# Patient Record
Sex: Male | Born: 1975
Health system: Southern US, Community
[De-identification: ages and names within clinical notes are randomized; demographics above are authoritative.]

## PROBLEM LIST (undated history)

## (undated) DIAGNOSIS — J45909 Unspecified asthma, uncomplicated: Secondary | ICD-10-CM

## (undated) DIAGNOSIS — F609 Personality disorder, unspecified: Secondary | ICD-10-CM

## (undated) DIAGNOSIS — E119 Type 2 diabetes mellitus without complications: Secondary | ICD-10-CM

## (undated) DIAGNOSIS — M7711 Lateral epicondylitis, right elbow: Secondary | ICD-10-CM

## (undated) DIAGNOSIS — I10 Essential (primary) hypertension: Secondary | ICD-10-CM

## (undated) HISTORY — DX: Unspecified asthma, uncomplicated: J45.909

## (undated) HISTORY — DX: Type 2 diabetes mellitus without complications: E11.9

## (undated) HISTORY — DX: Personality disorder, unspecified: F60.9

## (undated) HISTORY — DX: Essential (primary) hypertension: I10

---

## 1994-10-11 HISTORY — PX: MENISCUS REPAIR: SHX5179

## 2004-10-11 HISTORY — PX: ANTERIOR CRUCIATE LIGAMENT REPAIR: SHX115

## 2005-02-26 ENCOUNTER — Ambulatory Visit: Payer: Self-pay

## 2005-12-23 ENCOUNTER — Ambulatory Visit: Payer: Self-pay | Admitting: Internal Medicine

## 2006-01-12 ENCOUNTER — Ambulatory Visit: Payer: Self-pay | Admitting: Internal Medicine

## 2006-06-16 ENCOUNTER — Ambulatory Visit: Payer: Self-pay

## 2006-10-17 ENCOUNTER — Ambulatory Visit: Payer: Self-pay | Admitting: Unknown Physician Specialty

## 2008-10-11 HISTORY — PX: CERVICAL DISC SURGERY: SHX588

## 2010-07-25 ENCOUNTER — Ambulatory Visit: Payer: Self-pay | Admitting: Family Medicine

## 2010-09-25 ENCOUNTER — Ambulatory Visit: Payer: Self-pay | Admitting: Neurosurgery

## 2013-04-05 DIAGNOSIS — D229 Melanocytic nevi, unspecified: Secondary | ICD-10-CM

## 2013-04-05 HISTORY — DX: Melanocytic nevi, unspecified: D22.9

## 2013-11-26 ENCOUNTER — Ambulatory Visit: Payer: Self-pay | Admitting: General Practice

## 2014-04-23 DIAGNOSIS — D239 Other benign neoplasm of skin, unspecified: Secondary | ICD-10-CM

## 2014-04-23 HISTORY — DX: Other benign neoplasm of skin, unspecified: D23.9

## 2014-08-12 ENCOUNTER — Emergency Department: Payer: Self-pay | Admitting: Emergency Medicine

## 2015-09-15 ENCOUNTER — Encounter: Payer: BLUE CROSS/BLUE SHIELD | Attending: Physician Assistant | Admitting: *Deleted

## 2015-09-15 ENCOUNTER — Encounter: Payer: Self-pay | Admitting: *Deleted

## 2015-09-15 VITALS — BP 132/96 | Ht 71.0 in | Wt 255.9 lb

## 2015-09-15 DIAGNOSIS — E119 Type 2 diabetes mellitus without complications: Secondary | ICD-10-CM | POA: Diagnosis not present

## 2015-09-15 DIAGNOSIS — R7303 Prediabetes: Secondary | ICD-10-CM

## 2015-09-15 NOTE — Patient Instructions (Addendum)
Check blood sugars 2 x day before breakfast and 2 hrs after supper 3 x week Exercise:  Continue cardio for  30-60  minutes   4 days a week Eat 3 meals day,   1-2  snacks a day Space meals 4-6 hours apart Bring blood sugar records to the next class Call your doctor for a prescription for:  1. Meter strips (type) One Touch Ultra Blue checking  3 times per week  2. Lancets (type) One Touch Delica checking  3     times per week

## 2015-09-16 NOTE — Progress Notes (Signed)
Diabetes Self-Management Education  Visit Type: First/Initial  Appt. Start Time: 1555 Appt. End Time: [id number]  09/16/2015  Mr. Francisco Jackson, identified by name and date of birth, is a 39 y.o. male with a diagnosis of Diabetes: Type 2.   ASSESSMENT  Blood pressure 132/96, height 5\' 11"  (1.803 m), weight 255 lb 14.4 oz (116.075 kg). Body mass index is 35.71 kg/(m^2).      Diabetes Self-Management Education - 09/15/15 1732    Visit Information   Visit Type First/Initial   Initial Visit   Diabetes Type Type 2   Are you currently following a meal plan? Yes   What type of meal plan do you follow? "eating no whites"   Are you taking your medications as prescribed? Yes   Date Diagnosed 2 weeks ago   Health Coping   How would you rate your overall health? Good   Psychosocial Assessment   Patient Belief/Attitude about Diabetes Motivated to manage diabetes   Self-care barriers None   Self-management support Doctor's office;Family   Patient Concerns Nutrition/Meal planning;Monitoring;Glycemic Control;Weight Control;Healthy Lifestyle   Special Needs None   Preferred Learning Style Auditory   Learning Readiness Change in progress   How often do you need to have someone help you when you read instructions, pamphlets, or other written materials from your doctor or pharmacy? 1 - Never   What is the last grade level you completed in school? [id number]   Complications   Last HgB A1C per patient/outside source 6.4 %  08/29/15   How often do you check your blood sugar? 0 times/day (not testing)  Provided One Touch Ultra Mini meter and instructed on use. BG upon return demonstration was 94 mg/dL at 5:05 pm - 1 1/2 hrs pp.    Have you had a dilated eye exam in the past 12 months? Yes   Have you had a dental exam in the past 12 months? Yes   Are you checking your feet? No   Dietary Intake   Breakfast eggs and fruit or oatmeal with honey or egg sandwich with cheese (eats out 4 x week)   Snack  (morning) peanut butter crackers   Lunch chicken, greek salad (eats out 5 x week)   Snack (afternoon) fruit , peanut butter crackers   Dinner lean meat - deer, soup, chili beans, spaghetti, chicken with pintos, green beans (eats out 5 x week)   Beverage(s) water   Exercise   Exercise Type Moderate (swimming / aerobic walking)   How many days per week to you exercise? 4   How many minutes per day do you exercise? 45   Total minutes per week of exercise 180   Patient Education   Previous Diabetes Education No   Disease state  Definition of diabetes, type 1 and 2, and the diagnosis of diabetes   Nutrition management  Role of diet in the treatment of diabetes and the relationship between the three main macronutrients and blood glucose level   Physical activity and exercise  Role of exercise on diabetes management, blood pressure control and cardiac health.   Monitoring Taught/evaluated SMBG meter.;Purpose and frequency of SMBG.;Identified appropriate SMBG and/or A1C goals.   Chronic complications Relationship between chronic complications and blood glucose control   Individualized Goals (developed by patient)   Reducing Risk Improve blood sugars Prevent diabetes complications Lose weight Lead a healthier lifestyle   Outcomes   Expected Outcomes Demonstrated interest in learning. Expect positive outcomes      Individualized Plan  for Diabetes Self-Management Training:   Learning Objective:  Patient will have a greater understanding of diabetes self-management. Patient education plan is to attend individual and/or group sessions per assessed needs and concerns.   Plan:   Patient Instructions  Check blood sugars 2 x day before breakfast and 2 hrs after supper 3 x week Exercise:  Continue cardio for  30-60  minutes   4 days a week Eat 3 meals day,   1-2  snacks a day Space meals 4-6 hours apart Bring blood sugar records to the next class Call your doctor for a prescription for:  1.  Meter strips (type) One Touch Ultra Blue checking  3 times per week  2. Lancets (type) One Touch Delica checking  3     times per week   Expected Outcomes:  Demonstrated interest in learning. Expect positive outcomes  Education material provided:  General Meal Planning Guidelines Simple Meal Plan Meter - One Touch Ultra Mini   If problems or questions, patient to contact team via:   Francisco Jackson, [place], [place], CDE [phone]  Future DSME appointment:  October 09, 2015 for Class 1

## 2015-09-21 ENCOUNTER — Emergency Department
Admission: EM | Admit: 2015-09-21 | Discharge: 2015-09-21 | Disposition: A | Discharge status: Home / Self Care | Payer: BLUE CROSS/BLUE SHIELD | Attending: Emergency Medicine | Admitting: Emergency Medicine

## 2015-09-21 DIAGNOSIS — Y998 Other external cause status: Secondary | ICD-10-CM | POA: Insufficient documentation

## 2015-09-21 DIAGNOSIS — R197 Diarrhea, unspecified: Secondary | ICD-10-CM | POA: Diagnosis not present

## 2015-09-21 DIAGNOSIS — Y9389 Activity, other specified: Secondary | ICD-10-CM | POA: Diagnosis not present

## 2015-09-21 DIAGNOSIS — Z791 Long term (current) use of non-steroidal anti-inflammatories (NSAID): Secondary | ICD-10-CM | POA: Diagnosis not present

## 2015-09-21 DIAGNOSIS — Y9289 Other specified places as the place of occurrence of the external cause: Secondary | ICD-10-CM | POA: Insufficient documentation

## 2015-09-21 DIAGNOSIS — X58XXXA Exposure to other specified factors, initial encounter: Secondary | ICD-10-CM | POA: Insufficient documentation

## 2015-09-21 DIAGNOSIS — L500 Allergic urticaria: Secondary | ICD-10-CM | POA: Diagnosis not present

## 2015-09-21 DIAGNOSIS — M791 Myalgia: Secondary | ICD-10-CM | POA: Insufficient documentation

## 2015-09-21 DIAGNOSIS — Z87891 Personal history of nicotine dependence: Secondary | ICD-10-CM | POA: Insufficient documentation

## 2015-09-21 DIAGNOSIS — T7840XA Allergy, unspecified, initial encounter: Secondary | ICD-10-CM | POA: Insufficient documentation

## 2015-09-21 DIAGNOSIS — Z7951 Long term (current) use of inhaled steroids: Secondary | ICD-10-CM | POA: Insufficient documentation

## 2015-09-21 DIAGNOSIS — Z79899 Other long term (current) drug therapy: Secondary | ICD-10-CM | POA: Insufficient documentation

## 2015-09-21 DIAGNOSIS — R21 Rash and other nonspecific skin eruption: Secondary | ICD-10-CM | POA: Diagnosis present

## 2015-09-21 DIAGNOSIS — L509 Urticaria, unspecified: Secondary | ICD-10-CM

## 2015-09-21 LAB — CBC WITH DIFFERENTIAL/PLATELET
Basophils Absolute: 0 10*3/uL (ref 0–0.1)
Basophils Relative: 0 %
Eosinophils Absolute: 0.7 10*3/uL (ref 0–0.7)
Eosinophils Relative: 7 %
HCT: 47.6 % (ref 40.0–52.0)
Hemoglobin: 16.5 g/dL (ref 13.0–18.0)
Lymphocytes Relative: 28 %
Lymphs Abs: 2.9 10*3/uL (ref 1.0–3.6)
MCH: 29.4 pg (ref 26.0–34.0)
MCHC: 34.7 g/dL (ref 32.0–36.0)
MCV: 84.8 fL (ref 80.0–100.0)
Monocytes Absolute: 1.3 10*3/uL — ABNORMAL HIGH (ref 0.2–1.0)
Monocytes Relative: 13 %
Neutro Abs: 5.6 10*3/uL (ref 1.4–6.5)
Neutrophils Relative %: 52 %
Platelets: 291 10*3/uL (ref 150–440)
RBC: 5.62 MIL/uL (ref 4.40–5.90)
RDW: 12.4 % (ref 11.5–14.5)
WBC: 10.5 10*3/uL (ref 3.8–10.6)

## 2015-09-21 LAB — COMPREHENSIVE METABOLIC PANEL
ALT: 48 U/L (ref 17–63)
AST: 36 U/L (ref 15–41)
Albumin: 3.9 g/dL (ref 3.5–5.0)
Alkaline Phosphatase: 68 U/L (ref 38–126)
Anion gap: 8 (ref 5–15)
BUN: 31 mg/dL — ABNORMAL HIGH (ref 6–20)
CO2: 23 mmol/L (ref 22–32)
Calcium: 9.3 mg/dL (ref 8.9–10.3)
Chloride: 108 mmol/L (ref 101–111)
Creatinine, Ser: 1.18 mg/dL (ref 0.61–1.24)
GFR calc Af Amer: >60 mL/min (ref >60)
GFR calc non Af Amer: >60 mL/min (ref >60)
Glucose, Bld: 103 mg/dL — ABNORMAL HIGH (ref 65–99)
Potassium: 3.9 mmol/L (ref 3.5–5.1)
Sodium: 139 mmol/L (ref 135–145)
Total Bilirubin: 0.6 mg/dL (ref 0.3–1.2)
Total Protein: 7.8 g/dL (ref 6.5–8.1)

## 2015-09-21 MED ORDER — FAMOTIDINE IN NACL 20-0.9 MG/50ML-% IV SOLN
20.0000 mg | Freq: Once | INTRAVENOUS | Status: AC
  Administered 2015-09-21: 20 mg via INTRAVENOUS
  Filled 2015-09-21: qty 50

## 2015-09-21 MED ORDER — PREDNISONE 20 MG PO TABS: 60.0000 mg | ORAL_TABLET | Freq: Every day | ORAL | Status: AC

## 2015-09-21 MED ORDER — PREDNISONE 20 MG PO TABS
60.0000 mg | ORAL_TABLET | ORAL | Status: AC
  Administered 2015-09-21: 60 mg via ORAL
  Filled 2015-09-21: qty 3

## 2015-09-21 MED ORDER — DIPHENHYDRAMINE HCL 50 MG/ML IJ SOLN
25.0000 mg | Freq: Once | INTRAMUSCULAR | Status: AC
  Administered 2015-09-21: 25 mg via INTRAVENOUS
  Filled 2015-09-21: qty 1

## 2015-09-21 NOTE — ED Provider Notes (Signed)
[facility] Emergency Department Provider Note  ____________________________________________  Time seen: Approximately 5:37 AM  I have reviewed the triage vital signs and the nursing notes.   HISTORY  Chief Complaint Rash    HPI Francisco Jackson is a 39 y.o. male with no significant PMH other than prediabetes who presents with acute onset of generalized pruritic rash.  He states that he has had some viral symptoms for the last couple of weeks.  He recently got back from a camping trip the last about 2 days and when he returned he had a febrile illness for about 4 days with generalized body aches and myalgias and diarrhea.  That has resolved and during the day yesterday he felt well, was out and about with his wife, but he started developing a red itching raised rash first on his right wrist and then it moved to his abdomen.  He took some Benadryl and he got some relief, but then he awoke about 2 hours ago and the rash has generalized over his entire body except for his face and the palms of his hands and soles of his feet.  It is severely itchy and "driving me crazy".  It is not painful.  He denies fever/chills, chest pain, shortness of breath, abdominal pain, nausea, vomiting, diarrhea for the last 2 days.  He has not recently started any new medications.  He has no allergies of which she is aware.  He has not had hives in the past.  He is unaware of any tick bites.  He does not have any oral lesions, painful or otherwise.  He has no headache.  Past Medical History  Diagnosis Date  . Asthma     There are no active problems to display for this patient.   Past Surgical History  Procedure Laterality Date  . Cervical disc surgery  2010  . Anterior cruciate ligament repair Right 2006  . Meniscus repair Left 1996    Current Outpatient Rx  Name  Route  Sig  Dispense  Refill  . ALBUTEROL SULFATE HFA IN   Inhalation   Inhale 2 puffs into the lungs 4 (four) times  daily as needed.         . budesonide-formoterol (SYMBICORT) 160-4.5 MCG/ACT inhaler   Inhalation   Inhale 2 puffs into the lungs 2 (two) times daily.         . clonazePAM (KLONOPIN) 0.5 MG tablet   Oral   Take 0.5 mg by mouth 2 (two) times daily as needed for anxiety.         . Meloxicam (VIVLODEX) 10 MG CAPS   Oral   Take 10 mg by mouth daily.         . montelukast (SINGULAIR) 10 MG tablet   Oral   Take 10 mg by mouth at bedtime.         . predniSONE (DELTASONE) 20 MG tablet   Oral   Take 3 tablets (60 mg total) by mouth daily.   15 tablet   0     Allergies Review of patient's allergies indicates no known allergies.  Family History  Problem Relation Age of Onset  . Diabetes Mother   . Diabetes Paternal Aunt   . Diabetes Paternal Uncle   . Diabetes Maternal Grandmother   . Diabetes Maternal Grandfather     Social History Social History  Substance Use Topics  . Smoking status: Former Smoker -- 0.50 packs/day for 10 years    Types:  Cigarettes    Quit date: 10/11/2002  . Smokeless tobacco: Former Systems developer    Types: Snuff    Quit date: 10/11/2009  . Alcohol Use: 1.2 oz/week    2 Glasses of wine per week    Review of Systems Constitutional: No fever/chills the last 2 days.  For the 4 prior days he did have intermittent myalgias, general malaise, and subjective fevers. Eyes: No visual changes. ENT: No sore throat. Cardiovascular: Denies chest pain. Respiratory: Denies shortness of breath. Gastrointestinal: For the last 2 days he has not had any abdominal pain, nausea, vomiting, or diarrhea.  He did have diarrhea for the 4 days before that. Genitourinary: Negative for dysuria. Musculoskeletal: Negative for back pain. Skin: Generalized pruritic red rash Neurological: Negative for headaches, focal weakness or numbness.  10-point ROS otherwise negative.  ____________________________________________   PHYSICAL EXAM:  VITAL SIGNS: ED Triage Vitals   Enc Vitals Group     BP 09/21/15 0518 138/93 mmHg     Pulse Rate 09/21/15 0518 87     Resp 09/21/15 0518 18     Temp 09/21/15 0518 98 F (36.7 C)     Temp Source 09/21/15 0518 Oral     SpO2 09/21/15 0518 96 %     Weight 09/21/15 0518 248 lb (112.492 kg)     Height 09/21/15 0518 5\' 11"  (1.803 m)     Head Cir --      Peak Flow --      Pain Score --      Pain Loc --      Pain Edu? --      Excl. in [place]? --     Constitutional: Alert and oriented. Well appearing and in no acute distress. Eyes: Conjunctivae are normal. PERRL. EOMI. Head: Atraumatic. Nose: No congestion/rhinnorhea. Mouth/Throat: Mucous membranes are moist.  Oropharynx non-erythematous.  There are no oral lesions; no mucosal involvement. Neck: No stridor.   Cardiovascular: Normal rate, regular rhythm. Grossly normal heart sounds.  Good peripheral circulation. Respiratory: Normal respiratory effort.  No retractions. Lungs CTAB. Gastrointestinal: Soft and nontender. No distention. No abdominal bruits. No CVA tenderness. Musculoskeletal: No lower extremity tenderness nor edema.  No joint effusions. Neurologic:  Normal speech and language. No gross focal neurologic deficits are appreciated.  Skin:  Skin is warm, dry and intact.  Patient has urticaria all over his anterior and posterior torso although it is most notable on the anterior side as well as on all 4 of his extremities.  The palms and soles are spared as is his face and oral mucosa. Psychiatric: Mood and affect are normal. Speech and behavior are normal.  ____________________________________________   LABS (all labs ordered are listed, but only abnormal results are displayed)  Labs Reviewed  COMPREHENSIVE METABOLIC PANEL - Abnormal; Notable for the following:    Glucose, Bld 103 (*)    BUN 31 (*)    All other components within normal limits  CBC WITH DIFFERENTIAL/PLATELET - Abnormal; Notable for the following:    Monocytes Absolute 1.3 (*)    All other  components within normal limits   ____________________________________________  EKG  Not indicated ____________________________________________  RADIOLOGY   No results found.  ____________________________________________   PROCEDURES  Procedure(s) performed: None  Critical Care performed: No ____________________________________________   INITIAL IMPRESSION / ASSESSMENT AND PLAN / ED COURSE  Pertinent labs & imaging results that were available during my care of the patient were reviewed by me and considered in my medical decision making (see chart for  details).  The rash appears consistent with an allergic reaction to an unknown allergen.  However the history is complicated by his recent camping trip and febrile/diarrheal illness.  A tick borne illness would be very unexpected in early December but must be considered.  He has no other obvious sources of allergic reaction.  I will treat him with Benadryl, famotidine, and prednisone and will also check some basic labs looking for LFT elevation and thrombocytopenia.  He is normal vital signs and is afebrile tonight.  I anticipate treatment for unknown allergic reaction and outpatient follow-up.  ----------------------------------------- 6:43 AM on 09/21/2015 -----------------------------------------  Labs unremarkable with no evidence of LFT elevation or thrombocytopenia.  The patient's hives have nearly completely resolved and he feels much better.  He was resting and sleeping comfortably when I checked on him.  He is ready to go home and I think this is appropriate.    I gave my usual and customary return precautions.  I told him to take over-the-counter Benadryl in addition to the prescribed prednisone burst.  ____________________________________________  FINAL CLINICAL IMPRESSION(S) / ED DIAGNOSES  Final diagnoses:  Urticaria of entire body  Allergic reaction, initial encounter      NEW MEDICATIONS STARTED DURING THIS  VISIT:  New Prescriptions   PREDNISONE (DELTASONE) 20 MG TABLET    Take 3 tablets (60 mg total) by mouth daily.     Hinda Kehr, MD 09/21/15 [phone]

## 2015-09-21 NOTE — ED Notes (Signed)
Pt reports cold symptoms x 2 weeks. c/o fever and diarrhea from Tuesday to Friday of last week. c/o rash that started yesterday morning and has continued to increase.

## 2015-09-21 NOTE — Discharge Instructions (Signed)
You have been seen in the Emergency Department (ED) today for an allergic reaction.  You have been stable throughout your stay in the Emergency Department.  Please take your medications as prescribed and follow up with your doctor as indicated.  You should also take over-the-counter Benadryl around the clock for the next three days according to the dosing instructions on the package, or at the least take it as soon as you see your hives returning.  Return to the Emergency Department (ED) if you experience any worsening or new symptoms that concern you.   Hives Hives are itchy, red, swollen areas of the skin. They can vary in size and location on your body. Hives can come and go for hours or several days (acute hives) or for several weeks (chronic hives). Hives do not spread from person to person (noncontagious). They may get worse with scratching, exercise, and emotional stress. CAUSES   Allergic reaction to food, additives, or drugs.  Infections, including the common cold.  Illness, such as vasculitis, lupus, or thyroid disease.  Exposure to sunlight, heat, or cold.  Exercise.  Stress.  Contact with chemicals. SYMPTOMS   Red or white swollen patches on the skin. The patches may change size, shape, and location quickly and repeatedly.  Itching.  Swelling of the hands, feet, and face. This may occur if hives develop deeper in the skin. DIAGNOSIS  Your caregiver can usually tell what is wrong by performing a physical exam. Skin or blood tests may also be done to determine the cause of your hives. In some cases, the cause cannot be determined. TREATMENT  Mild cases usually get better with medicines such as antihistamines. Severe cases may require an emergency epinephrine injection. If the cause of your hives is known, treatment includes avoiding that trigger.  HOME CARE INSTRUCTIONS   Avoid causes that trigger your hives.  Take antihistamines as directed by your caregiver to reduce  the severity of your hives. Non-sedating or low-sedating antihistamines are usually recommended. Do not drive while taking an antihistamine.  Take any other medicines prescribed for itching as directed by your caregiver.  Wear loose-fitting clothing.  Keep all follow-up appointments as directed by your caregiver. SEEK MEDICAL CARE IF:   You have persistent or severe itching that is not relieved with medicine.  You have painful or swollen joints. SEEK IMMEDIATE MEDICAL CARE IF:   You have a fever.  Your tongue or lips are swollen.  You have trouble breathing or swallowing.  You feel tightness in the throat or chest.  You have abdominal pain. These problems may be the first sign of a life-threatening allergic reaction. Call your local emergency services (911 in [place] MAKE SURE YOU:   Understand these instructions.  Will watch your condition.  Will get help right away if you are not doing well or get worse.   This information is not intended to replace advice given to you by your health care provider. Make sure you discuss any questions you have with your health care provider.   Document Released: 09/27/2005 Document Revised: 10/02/2013 Document Reviewed: 12/21/2011 Elsevier Interactive Patient Education 2016 [place] An allergy is an abnormal reaction to a substance by the body's defense system (immune system). Allergies can develop at any age. WHAT CAUSES ALLERGIES? An allergic reaction happens when the immune system mistakenly reacts to a normally harmless substance, called an allergen, as if it were harmful. The immune system releases antibodies to fight the substance. Antibodies eventually release  a chemical called histamine into the bloodstream. The release of histamine is meant to protect the body from infection, but it also causes discomfort. An allergic reaction can be triggered by:  Eating an allergen.  Inhaling an allergen.  Touching an  allergen. WHAT TYPES OF ALLERGIES ARE THERE? There are many types of allergies. Common types include:  Seasonal allergies. People with this type of allergy are usually allergic to substances that are only present during certain seasons, such as molds and pollens.  Food allergies.  Drug allergies.  Insect allergies.  Animal dander allergies. WHAT ARE SYMPTOMS OF ALLERGIES? Possible allergy symptoms include:  Swelling of the lips, face, tongue, mouth, or throat.  Sneezing, coughing, or wheezing.  Nasal congestion.  Tingling in the mouth.  Rash.  Itching.  Itchy, red, swollen areas of skin (hives).  Watery eyes.  Vomiting.  Diarrhea.  Dizziness.  Lightheadedness.  Fainting.  Trouble breathing or swallowing.  Chest tightness.  Rapid heartbeat. HOW ARE ALLERGIES DIAGNOSED? Allergies are diagnosed with a medical and family history and one or more of the following:  Skin tests.  Blood tests.  A food diary. A food diary is a record of all the foods and drinks you have in a day and of all the symptoms you experience.  The results of an elimination diet. An elimination diet involves eliminating foods from your diet and then adding them back in one by one to find out if a certain food causes an allergic reaction. HOW ARE ALLERGIES TREATED? There is no cure for allergies, but allergic reactions can be treated with medicine. Severe reactions usually need to be treated at a hospital. HOW CAN REACTIONS BE PREVENTED? The best way to prevent an allergic reaction is by avoiding the substance you are allergic to. Allergy shots and medicines can also help prevent reactions in some cases. People with severe allergic reactions may be able to prevent a life-threatening reaction called anaphylaxis with a medicine given right after exposure to the allergen.   This information is not intended to replace advice given to you by your health care provider. Make sure you discuss any  questions you have with your health care provider.   Document Released: 12/21/2002 Document Revised: 10/18/2014 Document Reviewed: 07/09/2014 Elsevier Interactive Patient Education [employer].

## 2015-09-22 ENCOUNTER — Ambulatory Visit (INDEPENDENT_AMBULATORY_CARE_PROVIDER_SITE_OTHER): Payer: BLUE CROSS/BLUE SHIELD | Admitting: Physician Assistant

## 2015-09-22 ENCOUNTER — Telehealth: Payer: Self-pay | Admitting: Physician Assistant

## 2015-09-22 ENCOUNTER — Encounter: Payer: Self-pay | Admitting: Physician Assistant

## 2015-09-22 VITALS — BP 132/96 | HR 88 | Temp 99.1°F | Resp 16 | Wt 250.4 lb

## 2015-09-22 DIAGNOSIS — R509 Fever, unspecified: Secondary | ICD-10-CM | POA: Diagnosis not present

## 2015-09-22 DIAGNOSIS — L509 Urticaria, unspecified: Secondary | ICD-10-CM | POA: Diagnosis not present

## 2015-09-22 DIAGNOSIS — J4520 Mild intermittent asthma, uncomplicated: Secondary | ICD-10-CM | POA: Diagnosis not present

## 2015-09-22 DIAGNOSIS — J3089 Other allergic rhinitis: Secondary | ICD-10-CM

## 2015-09-22 MED ORDER — ALBUTEROL SULFATE HFA 108 (90 BASE) MCG/ACT IN AERS: 1.0000 | INHALATION_SPRAY | Freq: Four times a day (QID) | RESPIRATORY_TRACT | Status: DC | PRN

## 2015-09-22 MED ORDER — FLUTICASONE PROPIONATE 50 MCG/ACT NA SUSP: 2.0000 | Freq: Every day | NASAL | Status: DC

## 2015-09-22 MED ORDER — HYDROXYZINE HCL 50 MG PO TABS: 50.0000 mg | ORAL_TABLET | Freq: Three times a day (TID) | ORAL | Status: DC | PRN

## 2015-09-22 NOTE — Telephone Encounter (Signed)
Can we put him in at my 415.  Thanks.

## 2015-09-22 NOTE — Patient Instructions (Signed)
Hives Hives are itchy, red, swollen areas of the skin. They can vary in size and location on your body. Hives can come and go for hours or several days (acute hives) or for several weeks (chronic hives). Hives do not spread from person to person (noncontagious). They may get worse with scratching, exercise, and emotional stress. CAUSES   Allergic reaction to food, additives, or drugs.  Infections, including the common cold.  Illness, such as vasculitis, lupus, or thyroid disease.  Exposure to sunlight, heat, or cold.  Exercise.  Stress.  Contact with chemicals. SYMPTOMS   Red or white swollen patches on the skin. The patches may change size, shape, and location quickly and repeatedly.  Itching.  Swelling of the hands, feet, and face. This may occur if hives develop deeper in the skin. DIAGNOSIS  Your caregiver can usually tell what is wrong by performing a physical exam. Skin or blood tests may also be done to determine the cause of your hives. In some cases, the cause cannot be determined. TREATMENT  Mild cases usually get better with medicines such as antihistamines. Severe cases may require an emergency epinephrine injection. If the cause of your hives is known, treatment includes avoiding that trigger.  HOME CARE INSTRUCTIONS   Avoid causes that trigger your hives.  Take antihistamines as directed by your caregiver to reduce the severity of your hives. Non-sedating or low-sedating antihistamines are usually recommended. Do not drive while taking an antihistamine.  Take any other medicines prescribed for itching as directed by your caregiver.  Wear loose-fitting clothing.  Keep all follow-up appointments as directed by your caregiver. SEEK MEDICAL CARE IF:   You have persistent or severe itching that is not relieved with medicine.  You have painful or swollen joints. SEEK IMMEDIATE MEDICAL CARE IF:   You have a fever.  Your tongue or lips are swollen.  You have  trouble breathing or swallowing.  You feel tightness in the throat or chest.  You have abdominal pain. These problems may be the first sign of a life-threatening allergic reaction. Call your local emergency services (911 in [place] MAKE SURE YOU:   Understand these instructions.  Will watch your condition.  Will get help right away if you are not doing well or get worse.   This information is not intended to replace advice given to you by your health care provider. Make sure you discuss any questions you have with your health care provider.   Document Released: 09/27/2005 Document Revised: 10/02/2013 Document Reviewed: 12/21/2011 Elsevier Interactive Patient Education 2016 [place].  Hydroxyzine capsules or tablets What is this medicine? HYDROXYZINE (hye Galax i zeen) is an antihistamine. This medicine is used to treat allergy symptoms. It is also used to treat anxiety and tension. This medicine can be used with other medicines to induce sleep before surgery. This medicine may be used for other purposes; ask your health care provider or pharmacist if you have questions. What should I tell my health care provider before I take this medicine? They need to know if you have any of these conditions: -any chronic illness -difficulty passing urine -glaucoma -heart disease -kidney disease -liver disease -lung disease -an unusual or allergic reaction to hydroxyzine, cetirizine, other medicines, foods, dyes, or preservatives -pregnant or trying to get pregnant -breast-feeding How should I use this medicine? Take this medicine by mouth with a full glass of water. Follow the directions on the prescription label. You may take this medicine with food or on an  empty stomach. Take your medicine at regular intervals. Do not take your medicine more often than directed. Talk to your pediatrician regarding the use of this medicine in children. Special care may be needed. While this drug may be  prescribed for children as young as 66 years of age for selected conditions, precautions do apply. Patients over 13 years old may have a stronger reaction and need a smaller dose. Overdosage: If you think you have taken too much of this medicine contact a poison control center or emergency room at once. NOTE: This medicine is only for you. Do not share this medicine with others. What if I miss a dose? If you miss a dose, take it as soon as you can. If it is almost time for your next dose, take only that dose. Do not take double or extra doses. What may interact with this medicine? -alcohol -barbiturate medicines for sleep or seizures -medicines for colds, allergies -medicines for depression, anxiety, or emotional disturbances -medicines for pain -medicines for sleep -muscle relaxants This list may not describe all possible interactions. Give your health care provider a list of all the medicines, herbs, non-prescription drugs, or dietary supplements you use. Also tell them if you smoke, drink alcohol, or use illegal drugs. Some items may interact with your medicine. What should I watch for while using this medicine? Tell your doctor or health care professional if your symptoms do not improve. You may get drowsy or dizzy. Do not drive, use machinery, or do anything that needs mental alertness until you know how this medicine affects you. Do not stand or sit up quickly, especially if you are an older patient. This reduces the risk of dizzy or fainting spells. Alcohol may interfere with the effect of this medicine. Avoid alcoholic drinks. Your mouth may get dry. Chewing sugarless gum or sucking hard candy, and drinking plenty of water may help. Contact your doctor if the problem does not go away or is severe. This medicine may cause dry eyes and blurred vision. If you wear contact lenses you may feel some discomfort. Lubricating drops may help. See your eye doctor if the problem does not go away or is  severe. If you are receiving skin tests for allergies, tell your doctor you are using this medicine. What side effects may I notice from receiving this medicine? Side effects that you should report to your doctor or health care professional as soon as possible: -fast or irregular heartbeat -difficulty passing urine -seizures -slurred speech or confusion -tremor Side effects that usually do not require medical attention (report to your doctor or health care professional if they continue or are bothersome): -constipation -drowsiness -fatigue -headache -stomach upset This list may not describe all possible side effects. Call your doctor for medical advice about side effects. You may report side effects to FDA at 1-800-FDA-1088. Where should I keep my medicine? Keep out of the reach of children. Store at room temperature between 15 and 30 degrees C (59 and 86 degrees F). Keep container tightly closed. Throw away any unused medicine after the expiration date. NOTE: This sheet is a summary. It may not cover all possible information. If you have questions about this medicine, talk to your doctor, pharmacist, or health care provider.    2016, Elsevier/Gold Standard. (2008-02-09 14:50:59)

## 2015-09-22 NOTE — Progress Notes (Signed)
Patient: Francisco Jackson Male    DOB: 1976/04/07   39 y.o.   MRN: [id number] Visit Date: 09/22/2015  Today's Provider: Mar Daring, PA-C   Chief Complaint  Patient presents with  . Establish Care  . Follow-up    ER at [facility]   Subjective:    HPI  Follow Up ER Visit  Patient is here for ER follow up.  He was recently seen at [facility] for rash on 09/21/15. Treatment for this included Benadryl OTC as needed and Prednisone 60 mg. He reports good compliance with treatment. He reports this condition is Unchanged. Symptoms first started last Tuesday 09/16/15 while he was out of town [employer]. His symptoms began with a fever and chills, nausea and diarrhea. This went on for a few days. He continued to have a low-grade fever when the rash developed on Friday, 09/19/2015. He states when the rash first appeared it was on his wrist antecubital fossa is, and armpits. There was no itching or discomfort with the rash initially. The rash was stable until Saturday night when he had laid down for bed. He states he was awoken around 2:30 or 3 AM with intense itching and the rash had spread to cover his full body from his upper neck all the way to his feet. He never developed the rash on his palms or soles. Since the diarrhea has improved. Per patient still has the rash and is itching and cannot sleep. Itching bothers him the most at night. Patient was Diagnosed with Urticaria of entire body from unknown allergen in the ER. He states he was given IV Benadryl while in the ER and he watch the rash dissipate. He has continued oral over-the-counter Benadryl which she states is not helping. Today was his second day of the high-dose prednisone. He does not recall any tick exposure. However he was out in the woods and tall grass hunting, as well as being around dogs. There have been no changes in detergents, lotions, sheets, or other possible source of cause of  urticaria. ------------------------------------------------------------------------------------     No Known Allergies Previous Medications   ALBUTEROL SULFATE HFA IN    Inhale 2 puffs into the lungs 4 (four) times daily as needed.   BUDESONIDE-FORMOTEROL (SYMBICORT) 160-4.5 MCG/ACT INHALER    Inhale 2 puffs into the lungs 2 (two) times daily.   CLONAZEPAM (KLONOPIN) 0.5 MG TABLET    Take 0.5 mg by mouth 2 (two) times daily as needed for anxiety.   [employer] ALLERGY RELIEF 180 MG TABLET       FLUTICASONE (FLONASE) 50 MCG/ACT NASAL SPRAY    Place 2 sprays into both nostrils daily.   MELOXICAM (VIVLODEX) 10 MG CAPS    Take 10 mg by mouth daily.   MONTELUKAST (SINGULAIR) 10 MG TABLET    Take 10 mg by mouth at bedtime.   PREDNISONE (DELTASONE) 20 MG TABLET    Take 3 tablets (60 mg total) by mouth daily.    Review of Systems  Constitutional: Positive for fever, chills and fatigue.  HENT: Negative.   Eyes: Negative.   Respiratory: Negative.   Cardiovascular: Negative.   Gastrointestinal: Positive for nausea and diarrhea. Negative for vomiting, abdominal pain, constipation and blood in stool.  Endocrine: Negative.   Genitourinary: Negative.   Musculoskeletal: Negative.   Skin: Positive for rash.  Allergic/Immunologic: Negative.   Neurological: Negative.   Hematological: Negative.   Psychiatric/Behavioral: Negative.   All other systems reviewed and are  negative.   Social History  Substance Use Topics  . Smoking status: Former Smoker -- 0.50 packs/day for 10 years    Types: Cigarettes    Quit date: 10/11/2002  . Smokeless tobacco: Former Systems developer    Types: Snuff    Quit date: 10/11/2009  . Alcohol Use: 1.2 oz/week    2 Glasses of wine per week     Comment: ocassional   Objective:   BP 132/96 mmHg  Pulse 88  Temp(Src) 99.1 F (37.3 C) (Oral)  Resp 16  Wt 250 lb 6.4 oz (113.581 kg)  Physical Exam  Constitutional: He appears well-developed and well-nourished. No distress.  HENT:   Head: Normocephalic and atraumatic.  Neck: Normal range of motion. Neck supple. No JVD present. No tracheal deviation present. No thyromegaly present.  Cardiovascular: Normal rate, regular rhythm and normal heart sounds.  Exam reveals no gallop and no friction rub.   No murmur heard. Pulmonary/Chest: Effort normal and breath sounds normal. No respiratory distress. He has no wheezes. He has no rales.  Abdominal: Soft. Bowel sounds are normal. He exhibits no distension and no mass. There is no tenderness. There is no rebound and no guarding.  Lymphadenopathy:    He has no cervical adenopathy.  Skin: Rash noted. Rash is papular and urticarial. He is not diaphoretic.     Vitals reviewed.       Assessment & Plan:     1. Urticaria Labs that were done in the ER were unremarkable. He was not tested for any tick borne disease in the ER so I will do that even though it is not technically tick season, but due to his activity outdoors prior to symptom onset. I will also add hydroxyzine for him to take at night for the itching so that he may hopefully get some rest. He is to continue the prednisone as prescribed by the ER as it seems to be lessening the rash at this time. I will follow-up with him pending results of the below testing. He is to call the office if his symptoms worsen again. If not I will follow-up with him in 2 weeks to see how he is doing. - Lyme Ab/Western Blot Reflex - [place] spotted fvr abs pnl(IgG+IgM) - hydrOXYzine (ATARAX/VISTARIL) 50 MG tablet; Take 1 tablet (50 mg total) by mouth 3 (three) times daily as needed.  Dispense: 30 tablet; Refill: 0  2. Fever, unspecified fever cause I will recheck his CBC to make sure that he is not showing a viral shift as a possible cause. He does continue to have a low-grade fever with unknown cause. I will check him for tick borne disease such as Lyme and [facility] spotted fever. He is to continue treatment of his fever with Tylenol as  needed. He is to call the office if symptoms worsen. If not I will follow-up with him pending results of the lab work or in 2 weeks if labs are stable. - CBC with Differential - Lyme Ab/Western Blot Reflex  3. Asthma, mild intermittent, uncomplicated Currently stable but uses albuterol inhaler as needed for shortness of breath. Diagnosis was pulled to refill albuterol inhaler as below. - albuterol (PROVENTIL HFA;VENTOLIN HFA) 108 (90 BASE) MCG/ACT inhaler; Inhale 1-2 puffs into the lungs every 6 (six) hours as needed.  Dispense: 18 g; Refill: 6  4. Environmental and seasonal allergies Currently stable. Diagnosis was pulled to refill Flonase nasal spray as below. - fluticasone (FLONASE) 50 MCG/ACT nasal spray; Place 2 sprays  into both nostrils daily.  Dispense: 16 g; Refill: Molino, PA-C  [facility]

## 2015-09-26 ENCOUNTER — Telehealth: Payer: Self-pay | Admitting: Physician Assistant

## 2015-09-26 DIAGNOSIS — A770 Spotted fever due to Rickettsia rickettsii: Secondary | ICD-10-CM

## 2015-09-26 MED ORDER — DOXYCYCLINE HYCLATE 100 MG PO TABS: 100.0000 mg | ORAL_TABLET | Freq: Two times a day (BID) | ORAL | Status: DC

## 2015-09-26 NOTE — Telephone Encounter (Signed)
I went to the labs but I don't see any results.   Please advise.  Thanks,  -Usman Millett

## 2015-09-26 NOTE — Telephone Encounter (Signed)
Got labs and spoke with patient over the phone.  Will start doxycycline as labs indicate RMSF.  Will retest following treatment for RMSF clearance and recheck WBC count with CBC.  Patient is aware.  Will cancel upcoming appt.

## 2015-09-26 NOTE — Telephone Encounter (Signed)
Canceled patients appointment on 10/07/15  Thanks,  -Joseline

## 2015-09-26 NOTE — Telephone Encounter (Signed)
They would be coming as a fax from [facility]. I have not seen them yet either. I will call Freda Munro to see if she has them yet.  Thanks.

## 2015-09-26 NOTE — Telephone Encounter (Signed)
Pt is requesting lab results.  CB#[phone]

## 2015-10-07 ENCOUNTER — Ambulatory Visit: Payer: BLUE CROSS/BLUE SHIELD | Admitting: Physician Assistant

## 2015-10-09 ENCOUNTER — Encounter: Payer: BLUE CROSS/BLUE SHIELD | Admitting: Dietician

## 2015-10-09 VITALS — Ht <= 58 in | Wt 257.1 lb

## 2015-10-09 DIAGNOSIS — E119 Type 2 diabetes mellitus without complications: Secondary | ICD-10-CM

## 2015-10-09 NOTE — Progress Notes (Signed)
Appt. Start Time: 9:00am Appt. End Time: 12:00 pm  Class 1 Diabetes Overview - define DM; state own type of DM; identify functions of pancreas and insulin; define insulin deficiency vs insulin resistance  Psychosocial - identify DM as a source of stress; state the effects of stress on BG control; verbalize appropriate stress management techniques; identify personal stress issues   Nutritional Management - describe effects of food on blood glucose; identify sources of carbohydrate, protein and fat; verbalize the importance of balance meals in controlling blood glucose; identify meals as well balanced or not; estimate servings of carbohydrate from menus; use food labels to identify servings size, content of carbohydrate, fiber, protein, fat, saturated fat and sodium; recognize food sources of fat, saturated fat, trans fat, sodium and verbalize goals for intake; describe healthful appropriate food choices when dining out   Exercise - describe the effects of exercise on blood glucose and importance of regular exercise in controlling diabetes; state a plan for personal exercise; verbalize contraindications for exercise  Self-Monitoring - state importance of HBGM and demo procedure accurately; use HBGM results to effectively manage diabetes; identify importance of regular HbA1C testing and goals for results  Acute Complications/Sick Day Guidelines - recognize hyperglycemia and hypoglycemia with causes and effects; identify blood glucose results as high, low or in control; list steps in treating and preventing high and low blood glucose; state appropriate measure to manage blood glucose when ill (need for meds, HBGM plan, when to call physician, need for fluids)  Chronic Complications/Foot, Skin, Eye Dental Care - identify possible long-term complications of diabetes (retinopathy, neuropathy, nephropathy, cardiovascular disease, infections); explain steps in prevention and treatment of chronic complications;  state importance of daily self-foot exams; describe how to examine feet and what to look for; explain appropriate eye and dental care  Lifestyle Changes/Goals & Health/Community Resources - state benefits of making appropriate lifestyle changes; identify habits that need to change (meals, tobacco, alcohol); identify strategies to reduce risk factors for personal health; set goals for proper diabetes care; state need for and frequency of healthcare follow-up; describe appropriate community resources for good health (ADA, web sites, apps)   Pregnancy/Sexual Health - define gestational diabetes; state importance of good blood glucose control and birth control prior to pregnancy; state importance of good blood glucose control in preventing sexual problems (impotence, vaginal dryness, infections, loss of desire); state relationship of blood glucose control and pregnancy outcome; describe risk of maternal and fetal complications  Teaching Materials Used: Class 1 Slides/Notebook Diabetes Booklet ID Card  Medic Alert/Medic ID Forms Sleep Evaluation Exercise Handout Daily Food Record Planning a Balanced Meal Goals for Class 1 

## 2015-10-28 ENCOUNTER — Encounter: Payer: Self-pay | Admitting: Physician Assistant

## 2015-11-06 ENCOUNTER — Telehealth: Payer: Self-pay | Admitting: *Deleted

## 2015-11-06 ENCOUNTER — Ambulatory Visit: Payer: BLUE CROSS/BLUE SHIELD

## 2015-11-06 NOTE — Telephone Encounter (Signed)
Pt missed Class 2 today. Called and spoke with him and he reports that he forgot. He will come next week for Class 3 and make up today's class on 2/16.

## 2015-11-13 ENCOUNTER — Ambulatory Visit: Payer: BLUE CROSS/BLUE SHIELD

## 2015-11-27 ENCOUNTER — Ambulatory Visit: Payer: BLUE CROSS/BLUE SHIELD

## 2015-12-04 ENCOUNTER — Ambulatory Visit: Payer: BLUE CROSS/BLUE SHIELD

## 2015-12-08 ENCOUNTER — Encounter: Payer: Self-pay | Admitting: *Deleted

## 2016-06-16 DIAGNOSIS — J209 Acute bronchitis, unspecified: Secondary | ICD-10-CM | POA: Diagnosis not present

## 2016-07-05 ENCOUNTER — Ambulatory Visit
Admission: RE | Admit: 2016-07-05 | Discharge: 2016-07-05 | Disposition: A | Discharge status: Home / Self Care | Payer: BLUE CROSS/BLUE SHIELD | Source: Ambulatory Visit | Attending: Family Medicine | Admitting: Family Medicine

## 2016-07-05 ENCOUNTER — Other Ambulatory Visit: Payer: Self-pay | Admitting: Family Medicine

## 2016-07-05 DIAGNOSIS — J189 Pneumonia, unspecified organism: Secondary | ICD-10-CM | POA: Diagnosis not present

## 2016-07-05 DIAGNOSIS — R059 Cough, unspecified: Secondary | ICD-10-CM

## 2016-07-05 DIAGNOSIS — R05 Cough: Secondary | ICD-10-CM | POA: Insufficient documentation

## 2016-07-08 ENCOUNTER — Telehealth: Payer: Self-pay | Admitting: *Deleted

## 2016-07-08 NOTE — Telephone Encounter (Signed)
Ok for him to establish with Dr. Nicki Reaper. Once established he can request any records if needed.

## 2016-07-08 NOTE — Telephone Encounter (Signed)
Can we see if he is still using me as a PCP or of he is using Dr. Corinda Gubler as I have not seen him since last December.

## 2016-07-08 NOTE — Telephone Encounter (Signed)
I am ok if he establishes care.  Will need establish care appt scheduled.

## 2016-07-08 NOTE — Telephone Encounter (Signed)
Spoke with patient's wife and she reports that the patient would like to go to Dr. Bary Leriche office because they are more familiar with his history.

## 2016-07-08 NOTE — Telephone Encounter (Signed)
Please advise 

## 2016-07-08 NOTE — Telephone Encounter (Signed)
Pt scheduled  

## 2016-07-08 NOTE — Telephone Encounter (Signed)
Patient currently has out of control blood sugar levels. Pt's wife stated that pt lost 13 lbs in one week, due to blood sugar levels. Pt wife requested an earlier appt.

## 2016-07-08 NOTE — Telephone Encounter (Signed)
Please contact wife Nevin Bloodgood [phone]

## 2016-07-08 NOTE — Telephone Encounter (Signed)
Pt's wife would like to have pt established with Dr. Nicki Reaper, he currently was diagnosed with out of control diabetes.  Please give an ok to schedule pt  Pt wife contact Nevin Bloodgood [phone]

## 2016-07-21 ENCOUNTER — Ambulatory Visit (INDEPENDENT_AMBULATORY_CARE_PROVIDER_SITE_OTHER): Payer: BLUE CROSS/BLUE SHIELD | Admitting: Internal Medicine

## 2016-07-21 ENCOUNTER — Encounter (INDEPENDENT_AMBULATORY_CARE_PROVIDER_SITE_OTHER): Payer: Self-pay

## 2016-07-21 ENCOUNTER — Encounter: Payer: Self-pay | Admitting: Internal Medicine

## 2016-07-21 DIAGNOSIS — E7800 Pure hypercholesterolemia, unspecified: Secondary | ICD-10-CM

## 2016-07-21 DIAGNOSIS — J40 Bronchitis, not specified as acute or chronic: Secondary | ICD-10-CM

## 2016-07-21 DIAGNOSIS — E119 Type 2 diabetes mellitus without complications: Secondary | ICD-10-CM | POA: Diagnosis not present

## 2016-07-21 NOTE — Progress Notes (Signed)
Pre visit review using our clinic review tool, if applicable. No additional management support is needed unless otherwise documented below in the visit note. 

## 2016-07-21 NOTE — Progress Notes (Signed)
Patient ID: Francisco Jackson, male   DOB: [birth year], 40 y.o.   MRN: [id number]   Subjective:    Patient ID: Francisco Jackson, male    DOB: 06/18/1976, 40 y.o.   MRN: [id number]  HPI  Patient here to reestablish care.  He is accompanied by his wife.  History obtained from both of them.  I have not seen him in years.  He has been followed by Dr Burt Ek (through his work).  Has been having issues with reoccurring bronchitis.  Has required prednisone intermittently.  Is now using symbicort, albuterol and flonase and taking allegra and singulair.  Symptoms have improved.  Still with some intermittent issues.  On one of his recent visits, he complained of nocturia.  Blood sugar was 500 and a1c 8.4.  This was 2-3 weeks ago.  He has tried to adjust his diet.  Is eating some better.  Discussed a nutritionist referral.  He is in agreement.  No chest pain. No abdominal pain or cramping.  Bowels stable.  Sugars have improved.  Recent sugars in the 150-300 range.  He is only taking metformin daily.  He is off clonazepam.  Feels better off.     Past Medical History:  Diagnosis Date  . Asthma   . Diabetes mellitus without complication [facility])    Past Surgical History:  Procedure Laterality Date  . ANTERIOR CRUCIATE LIGAMENT REPAIR Right 2006  . [place] SURGERY  2010  . MENISCUS REPAIR Left 1996   Family History  Problem Relation Age of Onset  . Diabetes Mother   . Diabetes Paternal Aunt   . Diabetes Paternal Uncle   . Diabetes Maternal Grandmother   . Diabetes Maternal Grandfather    Social History   Social History  . Marital status: Married    Spouse name: N/A  . Number of children: N/A  . Years of education: N/A   Social History Main Topics  . Smoking status: Former Smoker    Packs/day: 0.50    Years: 10.00    Types: Cigarettes    Quit date: 10/11/2002  . Smokeless tobacco: Former Systems developer    Types: Snuff    Quit date: 10/11/2009  . Alcohol use 1.2 oz/week    2 Glasses of wine per  week     Comment: ocassional  . Drug use: Unknown  . Sexual activity: Not Asked   Other Topics Concern  . None   Social History Narrative  . None    Outpatient Encounter Prescriptions as of 07/21/2016  Medication Sig  . albuterol (PROVENTIL HFA;VENTOLIN HFA) 108 (90 BASE) MCG/ACT inhaler Inhale 1-2 puffs into the lungs every 6 (six) hours as needed.  . budesonide-formoterol (SYMBICORT) 160-4.5 MCG/ACT inhaler Inhale 2 puffs into the lungs 2 (two) times daily.  . clonazePAM (KLONOPIN) 0.5 MG tablet Take 0.5 mg by mouth 2 (two) times daily as needed for anxiety.  . clonazePAM (KLONOPIN) 0.5 MG tablet Take by mouth as needed.  . [employer] ALLERGY RELIEF 180 MG tablet   . fluconazole (DIFLUCAN) 100 MG tablet   . fluticasone (FLONASE) 50 MCG/ACT nasal spray Place 2 sprays into both nostrils daily.  . hydrOXYzine (ATARAX/VISTARIL) 50 MG tablet Take 1 tablet (50 mg total) by mouth 3 (three) times daily as needed.  . Meloxicam (VIVLODEX) 10 MG CAPS Take 10 mg by mouth daily.  . metFORMIN (GLUCOPHAGE-XR) 500 MG 24 hr tablet Take 500 mg by mouth daily.  . montelukast (SINGULAIR) 10 MG tablet Take 10 mg by  mouth at bedtime.  Marland Kitchen VIIBRYD 10 MG TABS Take 10 mg by mouth.  . [DISCONTINUED] doxycycline (VIBRA-TABS) 100 MG tablet Take 1 tablet (100 mg total) by mouth 2 (two) times daily.   No facility-administered encounter medications on file as of 07/21/2016.    ] Review of Systems  Constitutional: Negative for appetite change.       Lost weight when sugars were out of control.    HENT: Positive for congestion. Negative for sinus pressure.   Respiratory: Negative for cough, chest tightness and shortness of breath.   Cardiovascular: Negative for chest pain, palpitations and leg swelling.  Gastrointestinal: Negative for abdominal pain, diarrhea, nausea and vomiting.  Genitourinary: Negative for dysuria.       Frequent urination has improved.    Musculoskeletal: Negative for back pain and joint  swelling.  Skin: Negative for color change and rash.  Neurological: Negative for dizziness, light-headedness and headaches.  Psychiatric/Behavioral: Negative for agitation and dysphoric mood.       Objective:    Physical Exam  Constitutional: He appears well-developed and well-nourished. No distress.  HENT:  Nose: Nose normal.  Mouth/Throat: Oropharynx is clear and moist.  Neck: Neck supple. No thyromegaly present.  Cardiovascular: Normal rate and regular rhythm.   Pulmonary/Chest: Effort normal and breath sounds normal. No respiratory distress.  Abdominal: Soft. Bowel sounds are normal. There is no tenderness.  Musculoskeletal: He exhibits no edema or tenderness.  Lymphadenopathy:    He has no cervical adenopathy.  Skin: No rash noted. No erythema.  Psychiatric: He has a normal mood and affect. His behavior is normal.    BP 140/90   Pulse 81   Temp 98.1 F (36.7 C) (Oral)   Ht 5\' 11"  (1.803 m)   Wt 242 lb 12.8 oz (110.1 kg)   SpO2 96%   BMI 33.86 kg/m  Wt Readings from Last 3 Encounters:  07/21/16 242 lb 12.8 oz (110.1 kg)  10/09/15 257 lb 1.6 oz (116.6 kg)  09/22/15 250 lb 6.4 oz (113.6 kg)     Lab Results  Component Value Date   WBC 10.5 09/21/2015   HGB 16.5 09/21/2015   HCT 47.6 09/21/2015   PLT 291 09/21/2015   GLUCOSE 103 (H) 09/21/2015   ALT 48 09/21/2015   AST 36 09/21/2015   NA 139 09/21/2015   K 3.9 09/21/2015   CL 108 09/21/2015   CREATININE 1.18 09/21/2015   BUN 31 (H) 09/21/2015   CO2 23 09/21/2015    Dg Chest 2 View  Result Date: 07/05/2016 CLINICAL DATA:  Pneumonia. EXAM: CHEST  2 VIEW COMPARISON:  08/12/2014. FINDINGS: Mediastinum and hilar structures normal. Lungs are clear. Heart size normal. Mild bilateral pleural thickening suggesting scarring P. No pneumothorax. Cervical spine fusion. IMPRESSION: No acute cardiopulmonary disease. Electronically Signed   By: Marcello Moores  Register   On: 07/05/2016 10:09       Assessment & Plan:    Problem List Items Addressed This Visit    Diabetes mellitus ([place])    Sugars have been out of control.  Discussed importance of diet and exercise.  He has already made changes.  Sugars have improved.  He is only on metformin and only taking one per day.  Discussed treatment with him.  Increased metformin to bid.  Follow sugars.  May need additional medication.  Refer to nutritionist.  Follow.  Just had a1c checked through city.  Obtain labs - cholesterol, metabolic panel, etc.        Relevant  Medications   metFORMIN (GLUCOPHAGE-XR) 500 MG 24 hr tablet   Other Relevant Orders   Ambulatory referral to Nutrition and Diabetic Education   Frequent episodes of bronchitis    Using symbicort, albuterol, flonase and taking singulair and allegra.  Symptoms have improved with this regimen.  Still with reoccurring episodes.  Consider referral to pulmonary.        Hypercholesterolemia    Low cholesterol diet and exercise.  Follow lipid panel.  Has not been checked recently.         Other Visit Diagnoses   None.    I spent 25 minutes with the patient and more than 50% of the time was spent in consultation regarding the above.  Discussed at length with him today regarding diabetes and need for better sugar control.  Discussed treatment options.     Einar Pheasant, MD

## 2016-07-23 LAB — CBC AND DIFFERENTIAL
HCT: 46 % (ref 41–53)
Hemoglobin: 16.3 g/dL (ref 13.5–17.5)
Platelets: 303 10*3/uL (ref 150–399)
WBC: 6.3 10^3/mL

## 2016-07-23 LAB — BASIC METABOLIC PANEL
BUN: 17 mg/dL (ref 4–21)
Glucose: 235 mg/dL
Potassium: 4.3 mmol/L (ref 3.4–5.3)
Sodium: 136 mmol/L — AB (ref 137–147)

## 2016-07-23 LAB — HEPATIC FUNCTION PANEL
ALT: 34 U/L (ref 10–40)
AST: 20 U/L (ref 14–40)
Alkaline Phosphatase: 75 U/L (ref 25–125)
Bilirubin, Total: 0.5 mg/dL

## 2016-07-23 LAB — LIPID PANEL
Cholesterol: 217 mg/dL — AB (ref 0–200)
HDL: 30 mg/dL — AB (ref 35–70)
LDL Cholesterol: 166 mg/dL
Triglycerides: 106 mg/dL (ref 40–160)

## 2016-07-23 LAB — HEMOGLOBIN A1C: Hemoglobin A1C: 9.9

## 2016-07-23 LAB — TSH: TSH: 1.73 u[IU]/mL (ref 0.41–5.90)

## 2016-08-01 ENCOUNTER — Encounter: Payer: Self-pay | Admitting: Internal Medicine

## 2016-08-01 DIAGNOSIS — J40 Bronchitis, not specified as acute or chronic: Secondary | ICD-10-CM | POA: Insufficient documentation

## 2016-08-01 DIAGNOSIS — E782 Mixed hyperlipidemia: Secondary | ICD-10-CM | POA: Insufficient documentation

## 2016-08-01 DIAGNOSIS — E119 Type 2 diabetes mellitus without complications: Secondary | ICD-10-CM | POA: Insufficient documentation

## 2016-08-01 DIAGNOSIS — E7800 Pure hypercholesterolemia, unspecified: Secondary | ICD-10-CM | POA: Insufficient documentation

## 2016-08-01 MED ORDER — METFORMIN HCL ER 500 MG PO TB24: 500.0000 mg | ORAL_TABLET | Freq: Two times a day (BID) | ORAL | 1 refills | Status: DC

## 2016-08-01 NOTE — Assessment & Plan Note (Signed)
Low cholesterol diet and exercise.  Follow lipid panel.  Has not been checked recently.

## 2016-08-01 NOTE — Assessment & Plan Note (Signed)
Using symbicort, albuterol, flonase and taking singulair and allegra.  Symptoms have improved with this regimen.  Still with reoccurring episodes.  Consider referral to pulmonary.

## 2016-08-01 NOTE — Assessment & Plan Note (Signed)
Sugars have been out of control.  Discussed importance of diet and exercise.  He has already made changes.  Sugars have improved.  He is only on metformin and only taking one per day.  Discussed treatment with him.  Increased metformin to bid.  Follow sugars.  May need additional medication.  Refer to nutritionist.  Follow.  Just had a1c checked through city.  Obtain labs - cholesterol, metabolic panel, etc.

## 2016-08-02 DIAGNOSIS — E119 Type 2 diabetes mellitus without complications: Secondary | ICD-10-CM | POA: Diagnosis not present

## 2016-08-02 DIAGNOSIS — H524 Presbyopia: Secondary | ICD-10-CM | POA: Diagnosis not present

## 2016-08-18 ENCOUNTER — Ambulatory Visit (INDEPENDENT_AMBULATORY_CARE_PROVIDER_SITE_OTHER): Payer: BLUE CROSS/BLUE SHIELD | Admitting: Internal Medicine

## 2016-08-18 ENCOUNTER — Encounter: Payer: Self-pay | Admitting: Internal Medicine

## 2016-08-18 DIAGNOSIS — J40 Bronchitis, not specified as acute or chronic: Secondary | ICD-10-CM

## 2016-08-18 DIAGNOSIS — E7800 Pure hypercholesterolemia, unspecified: Secondary | ICD-10-CM | POA: Diagnosis not present

## 2016-08-18 DIAGNOSIS — E119 Type 2 diabetes mellitus without complications: Secondary | ICD-10-CM | POA: Diagnosis not present

## 2016-08-18 NOTE — Progress Notes (Signed)
Pre visit review using our clinic review tool, if applicable. No additional management support is needed unless otherwise documented below in the visit note. 

## 2016-08-18 NOTE — Progress Notes (Signed)
Patient ID: Francisco Jackson, male   DOB: [birth year], 40 y.o.   MRN: [id number]   Subjective:    Patient ID: Francisco Jackson, male    DOB: 26-May-1976, 40 y.o.   MRN: [id number]  HPI  Patient here for a scheduled follow up.  He is accompanied by his wife.  History obtained from both of them.  He has adjusted his diet.  Discussed diet and exercise.  Sugars are much better now.  He is taking metformin.  Tolerating.  Planning to attend [facility] in several weeks.  No chest pain.  No sob.  Breathing stable.  No acid reflux.  No abdominal pain or cramping.  Bowels stable.  Overall feels better.  Reviewed sugars.    Past Medical History:  Diagnosis Date  . Asthma   . Diabetes mellitus without complication [facility])    Past Surgical History:  Procedure Laterality Date  . ANTERIOR CRUCIATE LIGAMENT REPAIR Right 2006  . [place] SURGERY  2010  . MENISCUS REPAIR Left 1996   Family History  Problem Relation Age of Onset  . Diabetes Mother   . Diabetes Paternal Aunt   . Diabetes Paternal Uncle   . Diabetes Maternal Grandmother   . Diabetes Maternal Grandfather    Social History   Social History  . Marital status: Married    Spouse name: N/A  . Number of children: N/A  . Years of education: N/A   Social History Main Topics  . Smoking status: Former Smoker    Packs/day: 0.50    Years: 10.00    Types: Cigarettes    Quit date: 10/11/2002  . Smokeless tobacco: Former Systems developer    Types: Snuff    Quit date: 10/11/2009  . Alcohol use 1.2 oz/week    2 Glasses of wine per week     Comment: ocassional  . Drug use: Unknown  . Sexual activity: Not Asked   Other Topics Concern  . None   Social History Narrative  . None    Outpatient Encounter Prescriptions as of 08/18/2016  Medication Sig  . albuterol (PROVENTIL HFA;VENTOLIN HFA) 108 (90 BASE) MCG/ACT inhaler Inhale 1-2 puffs into the lungs every 6 (six) hours as needed.  . budesonide-formoterol (SYMBICORT) 160-4.5 MCG/ACT inhaler Inhale  2 puffs into the lungs 2 (two) times daily.  . clonazePAM (KLONOPIN) 0.5 MG tablet Take 0.5 mg by mouth 2 (two) times daily as needed for anxiety.  . [employer] ALLERGY RELIEF 180 MG tablet   . fluticasone (FLONASE) 50 MCG/ACT nasal spray Place 2 sprays into both nostrils daily.  . Meloxicam (VIVLODEX) 10 MG CAPS Take 10 mg by mouth daily.  . metFORMIN (GLUCOPHAGE-XR) 500 MG 24 hr tablet Take 1 tablet (500 mg total) by mouth 2 (two) times daily before a meal.  . montelukast (SINGULAIR) 10 MG tablet Take 10 mg by mouth at bedtime.  . [DISCONTINUED] clonazePAM (KLONOPIN) 0.5 MG tablet Take by mouth as needed.  . [DISCONTINUED] fluconazole (DIFLUCAN) 100 MG tablet   . [DISCONTINUED] hydrOXYzine (ATARAX/VISTARIL) 50 MG tablet Take 1 tablet (50 mg total) by mouth 3 (three) times daily as needed.  . [DISCONTINUED] VIIBRYD 10 MG TABS Take 10 mg by mouth.   No facility-administered encounter medications on file as of 08/18/2016.     Review of Systems  Constitutional: Negative for appetite change and unexpected weight change.       Has adjusted his diet.    HENT: Negative for congestion and sinus pressure.   Respiratory: Negative  for cough, chest tightness and shortness of breath.   Cardiovascular: Negative for chest pain, palpitations and leg swelling.  Gastrointestinal: Negative for abdominal pain, diarrhea, nausea and vomiting.  Genitourinary: Negative for difficulty urinating and dysuria.  Musculoskeletal: Negative for back pain and joint swelling.  Skin: Negative for color change and rash.  Neurological: Negative for dizziness, light-headedness and headaches.  Psychiatric/Behavioral: Negative for agitation and dysphoric mood.       Objective:     Blood pressure rechecked by me:  130/88  Physical Exam  Constitutional: He appears well-developed and well-nourished. No distress.  HENT:  Nose: Nose normal.  Mouth/Throat: Oropharynx is clear and moist.  Neck: Neck supple. No thyromegaly present.   Cardiovascular: Normal rate and regular rhythm.   Pulmonary/Chest: Effort normal and breath sounds normal. No respiratory distress.  Abdominal: Soft. Bowel sounds are normal. There is no tenderness.  Musculoskeletal: He exhibits no edema or tenderness.  Lymphadenopathy:    He has no cervical adenopathy.  Skin: No rash noted. No erythema.  Psychiatric: He has a normal mood and affect. His behavior is normal.    BP 132/88   Pulse 74   Temp 98.4 F (36.9 C) (Oral)   Ht 5\' 11"  (1.803 m)   Wt 248 lb 12.8 oz (112.9 kg)   SpO2 97%   BMI 34.70 kg/m  Wt Readings from Last 3 Encounters:  08/18/16 248 lb 12.8 oz (112.9 kg)  07/21/16 242 lb 12.8 oz (110.1 kg)  10/09/15 257 lb 1.6 oz (116.6 kg)     Lab Results  Component Value Date   WBC 6.3 07/23/2016   HGB 16.3 07/23/2016   HCT 46 07/23/2016   PLT 303 07/23/2016   GLUCOSE 103 (H) 09/21/2015   CHOL 217 (A) 07/23/2016   TRIG 106 07/23/2016   HDL 30 (A) 07/23/2016   LDLCALC 166 07/23/2016   ALT 34 07/23/2016   AST 20 07/23/2016   NA 136 (A) 07/23/2016   K 4.3 07/23/2016   CL 108 09/21/2015   CREATININE 1.18 09/21/2015   BUN 17 07/23/2016   CO2 23 09/21/2015   TSH 1.73 07/23/2016   HGBA1C 9.9 07/23/2016    Dg Chest 2 View  Result Date: 07/05/2016 CLINICAL DATA:  Pneumonia. EXAM: CHEST  2 VIEW COMPARISON:  08/12/2014. FINDINGS: Mediastinum and hilar structures normal. Lungs are clear. Heart size normal. Mild bilateral pleural thickening suggesting scarring P. No pneumothorax. Cervical spine fusion. IMPRESSION: No acute cardiopulmonary disease. Electronically Signed   By: Marcello Moores  Register   On: 07/05/2016 10:09       Assessment & Plan:   Problem List Items Addressed This Visit    Diabetes mellitus ([place])    Has adjusted his diet.  Discussed diet and exercise.  On metformin.  Sugars have improved significantly.  Given the improvement, will hold on adding additional medication.  Follow closely.  Keep appt with [facility].   Keep up to date with eye checks.  Follow.        Frequent episodes of bronchitis    Doing better on his current regimen.  Follow.  Breathing doing well.        Hypercholesterolemia    Low cholesterol diet and exercise.  Had labs drawn through his work.  Obtain results.  Follow diet and exercise.  Follow lipid panel.            Einar Pheasant, MD

## 2016-08-29 ENCOUNTER — Encounter: Payer: Self-pay | Admitting: Internal Medicine

## 2016-08-29 NOTE — Assessment & Plan Note (Signed)
Doing better on his current regimen.  Follow.  Breathing doing well.

## 2016-08-29 NOTE — Assessment & Plan Note (Signed)
Has adjusted his diet.  Discussed diet and exercise.  On metformin.  Sugars have improved significantly.  Given the improvement, will hold on adding additional medication.  Follow closely.  Keep appt with [facility].  Keep up to date with eye checks.  Follow.

## 2016-08-29 NOTE — Assessment & Plan Note (Signed)
Low cholesterol diet and exercise.  Had labs drawn through his work.  Obtain results.  Follow diet and exercise.  Follow lipid panel.

## 2016-09-16 ENCOUNTER — Ambulatory Visit: Payer: BLUE CROSS/BLUE SHIELD | Admitting: Dietician

## 2016-09-23 ENCOUNTER — Ambulatory Visit: Payer: BLUE CROSS/BLUE SHIELD | Admitting: Dietician

## 2016-10-13 ENCOUNTER — Other Ambulatory Visit: Payer: Self-pay | Admitting: Internal Medicine

## 2016-10-13 DIAGNOSIS — J3089 Other allergic rhinitis: Secondary | ICD-10-CM

## 2016-10-19 ENCOUNTER — Ambulatory Visit (INDEPENDENT_AMBULATORY_CARE_PROVIDER_SITE_OTHER): Payer: BLUE CROSS/BLUE SHIELD | Admitting: Internal Medicine

## 2016-10-19 ENCOUNTER — Encounter: Payer: Self-pay | Admitting: Internal Medicine

## 2016-10-19 DIAGNOSIS — F439 Reaction to severe stress, unspecified: Secondary | ICD-10-CM | POA: Diagnosis not present

## 2016-10-19 DIAGNOSIS — E7800 Pure hypercholesterolemia, unspecified: Secondary | ICD-10-CM | POA: Diagnosis not present

## 2016-10-19 DIAGNOSIS — E119 Type 2 diabetes mellitus without complications: Secondary | ICD-10-CM

## 2016-10-19 DIAGNOSIS — I10 Essential (primary) hypertension: Secondary | ICD-10-CM | POA: Diagnosis not present

## 2016-10-19 MED ORDER — LOSARTAN POTASSIUM 50 MG PO TABS: 50.0000 mg | ORAL_TABLET | Freq: Every day | ORAL | 1 refills | Status: DC

## 2016-10-19 NOTE — Progress Notes (Signed)
Patient ID: Francisco Jackson, male   DOB: 01/07/1976, 40 y.o.   MRN: [id number]   Subjective:    Patient ID: Francisco Jackson, male    DOB: 07/20/1976, 40 y.o.   MRN: [id number]  HPI  Patient here for a scheduled follow up.  Her to f/u on his blood pressure and his sugars.  He has adjusted his diet, but has not been doing as well lately.  Discussed exercise.  Tries to stay active.  No chest pain.  No sob.  No acid reflux.  No abdominal pain or cramping.  Bowels stable.  Blood pressure staying a little elevated.  Diastolic - 90s.  No urine change.  Increased stress with his relationship.  Discussed at length with him today.  He is seeing psychiatry.  Discussed f/u.     Past Medical History:  Diagnosis Date  . Asthma   . Diabetes mellitus without complication ([facility])    Past Surgical History:  Procedure Laterality Date  . ANTERIOR CRUCIATE LIGAMENT REPAIR Right 2006  . CERVICAL DISC SURGERY  2010  . MENISCUS REPAIR Left 1996   Family History  Problem Relation Age of Onset  . Diabetes Mother   . Diabetes Paternal Aunt   . Diabetes Paternal Uncle   . Diabetes Maternal Grandmother   . Diabetes Maternal Grandfather    Social History   Social History  . Marital status: Married    Spouse name: N/A  . Number of children: N/A  . Years of education: N/A   Social History Main Topics  . Smoking status: Former Smoker    Packs/day: 0.50    Years: 10.00    Types: Cigarettes    Quit date: 10/11/2002  . Smokeless tobacco: Former User    Types: Snuff    Quit date: 10/11/2009  . Alcohol use 1.2 oz/week    2 Glasses of wine per week     Comment: ocassional  . Drug use: Unknown  . Sexual activity: Not Asked   Other Topics Concern  . None   Social History Narrative  . None    Outpatient Encounter Prescriptions as of 10/19/2016  Medication Sig  . albuterol (PROVENTIL HFA;VENTOLIN HFA) 108 (90 BASE) MCG/ACT inhaler Inhale 1-2 puffs into the lungs every 6 (six) hours as needed.  .  budesonide-formoterol (SYMBICORT) 160-4.5 MCG/ACT inhaler Inhale 2 puffs into the lungs 2 (two) times daily.  . clonazePAM (KLONOPIN) 0.5 MG tablet TAKE ONE TABLET BY MOUTH TWICE DAILY AS NEEDED  . fexofenadine (ALLEGRA) 180 MG tablet TAKE 1 TABLET BY MOUTH DAILY  . fluticasone (FLONASE) 50 MCG/ACT nasal spray USE 2 SPRAYS IN BOTH NOSTRILS DAILY  . Meloxicam (VIVLODEX) 10 MG CAPS Take 10 mg by mouth daily.  . metFORMIN (GLUCOPHAGE-XR) 500 MG 24 hr tablet Take 1 tablet (500 mg total) by mouth 2 (two) times daily before a meal.  . montelukast (SINGULAIR) 10 MG tablet Take 10 mg by mouth at bedtime.  . losartan (COZAAR) 50 MG tablet Take 1 tablet (50 mg total) by mouth daily.   No facility-administered encounter medications on file as of 10/19/2016.     Review of Systems  Constitutional: Negative for appetite change.       Not watching his diet as well.  Weight increased.    HENT: Negative for congestion and sinus pressure.   Respiratory: Negative for cough, chest tightness and shortness of breath.   Cardiovascular: Negative for chest pain, palpitations and leg swelling.  Gastrointestinal: Negative for abdominal pain,   diarrhea, nausea and vomiting.  Genitourinary: Negative for difficulty urinating and dysuria.  Musculoskeletal: Negative for joint swelling and myalgias.  Skin: Negative for color change and rash.  Neurological: Negative for dizziness, light-headedness and headaches.  Psychiatric/Behavioral: Negative for dysphoric mood.       Increased stress as outlined.         Objective:     Blood pressure rechecked by me:  138/92  Physical Exam  Constitutional: He appears well-developed and well-nourished. No distress.  HENT:  Nose: Nose normal.  Mouth/Throat: Oropharynx is clear and moist.  Neck: Neck supple. No thyromegaly present.  Cardiovascular: Normal rate and regular rhythm.   Pulmonary/Chest: Effort normal and breath sounds normal. No respiratory distress.  Abdominal:  Soft. Bowel sounds are normal. There is no tenderness.  Musculoskeletal: He exhibits no edema or tenderness.  Lymphadenopathy:    He has no cervical adenopathy.  Skin: No rash noted. No erythema.  Psychiatric: He has a normal mood and affect. His behavior is normal.    BP (!) 130/100   Pulse 86   Wt 255 lb (115.7 kg)   SpO2 99%   BMI 35.57 kg/m  Wt Readings from Last 3 Encounters:  10/19/16 255 lb (115.7 kg)  08/18/16 248 lb 12.8 oz (112.9 kg)  07/21/16 242 lb 12.8 oz (110.1 kg)     Lab Results  Component Value Date   WBC 6.3 07/23/2016   HGB 16.3 07/23/2016   HCT 46 07/23/2016   PLT 303 07/23/2016   GLUCOSE 103 (H) 09/21/2015   CHOL 217 (A) 07/23/2016   TRIG 106 07/23/2016   HDL 30 (A) 07/23/2016   LDLCALC 166 07/23/2016   ALT 34 07/23/2016   AST 20 07/23/2016   NA 136 (A) 07/23/2016   K 4.3 07/23/2016   CL 108 09/21/2015   CREATININE 1.18 09/21/2015   BUN 17 07/23/2016   CO2 23 09/21/2015   TSH 1.73 07/23/2016   HGBA1C 9.9 07/23/2016    Dg Chest 2 View  Result Date: 07/05/2016 CLINICAL DATA:  Pneumonia. EXAM: CHEST  2 VIEW COMPARISON:  08/12/2014. FINDINGS: Mediastinum and hilar structures normal. Lungs are clear. Heart size normal. Mild bilateral pleural thickening suggesting scarring P. No pneumothorax. Cervical spine fusion. IMPRESSION: No acute cardiopulmonary disease. Electronically Signed   By: Marcello Moores  Register   On: 07/05/2016 10:09       Assessment & Plan:   Problem List Items Addressed This Visit    Diabetes mellitus ([place])    Low carb diet and exercise.  Discussed diet and exercise today.  Follow met b and a1c.  On metformin.        Relevant Medications   losartan (COZAAR) 50 MG tablet   Essential hypertension    Blood pressure remaining elevated.  Start losartan 71m q day.  Check metabolic panel in 116-38days.  Follow pressures.  Follow metabolic panel.       Relevant Medications   losartan (COZAAR) 50 MG tablet   Hypercholesterolemia     Low cholesterol diet and exercise.  Follow lipid panel.        Relevant Medications   losartan (COZAAR) 50 MG tablet   Stress    Increased stress as outlined.  Discussed at length with him today.  Sees psychiatry.  Recommended f/u psychiatry.  Follow.            SEinar Pheasant MD

## 2016-10-24 ENCOUNTER — Encounter: Payer: Self-pay | Admitting: Internal Medicine

## 2016-10-24 DIAGNOSIS — I10 Essential (primary) hypertension: Secondary | ICD-10-CM | POA: Insufficient documentation

## 2016-10-24 DIAGNOSIS — F439 Reaction to severe stress, unspecified: Secondary | ICD-10-CM | POA: Insufficient documentation

## 2016-10-24 NOTE — Assessment & Plan Note (Signed)
Low carb diet and exercise.  Discussed diet and exercise today.  Follow met b and a1c.  On metformin.

## 2016-10-24 NOTE — Assessment & Plan Note (Signed)
Low cholesterol diet and exercise.  Follow lipid panel.   

## 2016-10-24 NOTE — Assessment & Plan Note (Signed)
Increased stress as outlined.  Discussed at length with him today.  Sees psychiatry.  Recommended f/u psychiatry.  Follow.

## 2016-10-24 NOTE — Assessment & Plan Note (Signed)
Blood pressure remaining elevated.  Start losartan 50mg  q day.  Check metabolic panel in [id number] days.  Follow pressures.  Follow metabolic panel.

## 2016-10-26 ENCOUNTER — Encounter: Payer: Self-pay | Admitting: Emergency Medicine

## 2016-10-26 ENCOUNTER — Emergency Department: Payer: BLUE CROSS/BLUE SHIELD

## 2016-10-26 ENCOUNTER — Other Ambulatory Visit: Payer: Self-pay

## 2016-10-26 ENCOUNTER — Emergency Department
Admission: EM | Admit: 2016-10-26 | Discharge: 2016-10-26 | Disposition: A | Discharge status: Home / Self Care | Payer: BLUE CROSS/BLUE SHIELD | Attending: Emergency Medicine | Admitting: Emergency Medicine

## 2016-10-26 DIAGNOSIS — E119 Type 2 diabetes mellitus without complications: Secondary | ICD-10-CM | POA: Insufficient documentation

## 2016-10-26 DIAGNOSIS — Z7984 Long term (current) use of oral hypoglycemic drugs: Secondary | ICD-10-CM | POA: Insufficient documentation

## 2016-10-26 DIAGNOSIS — Z5321 Procedure and treatment not carried out due to patient leaving prior to being seen by health care provider: Secondary | ICD-10-CM | POA: Diagnosis not present

## 2016-10-26 DIAGNOSIS — Z87891 Personal history of nicotine dependence: Secondary | ICD-10-CM | POA: Diagnosis not present

## 2016-10-26 DIAGNOSIS — R0789 Other chest pain: Secondary | ICD-10-CM | POA: Diagnosis present

## 2016-10-26 DIAGNOSIS — J45909 Unspecified asthma, uncomplicated: Secondary | ICD-10-CM | POA: Insufficient documentation

## 2016-10-26 DIAGNOSIS — R002 Palpitations: Secondary | ICD-10-CM | POA: Insufficient documentation

## 2016-10-26 LAB — CBC
HCT: 47.6 % (ref 40.0–52.0)
Hemoglobin: 16.4 g/dL (ref 13.0–18.0)
MCH: 29.4 pg (ref 26.0–34.0)
MCHC: 34.5 g/dL (ref 32.0–36.0)
MCV: 85.3 fL (ref 80.0–100.0)
Platelets: 282 10*3/uL (ref 150–440)
RBC: 5.59 MIL/uL (ref 4.40–5.90)
RDW: 12.9 % (ref 11.5–14.5)
WBC: 9.8 10*3/uL (ref 3.8–10.6)

## 2016-10-26 LAB — BASIC METABOLIC PANEL
Anion gap: 8 (ref 5–15)
BUN: 19 mg/dL (ref 6–20)
CO2: 24 mmol/L (ref 22–32)
Calcium: 9.3 mg/dL (ref 8.9–10.3)
Chloride: 104 mmol/L (ref 101–111)
Creatinine, Ser: 1.21 mg/dL (ref 0.61–1.24)
GFR calc Af Amer: >60 mL/min (ref >60)
GFR calc non Af Amer: >60 mL/min (ref >60)
Glucose, Bld: 198 mg/dL — ABNORMAL HIGH (ref 65–99)
Potassium: 3.8 mmol/L (ref 3.5–5.1)
Sodium: 136 mmol/L (ref 135–145)

## 2016-10-26 LAB — GLUCOSE, CAPILLARY: Glucose-Capillary: 197 mg/dL — ABNORMAL HIGH (ref 65–99)

## 2016-10-26 LAB — TROPONIN I: Troponin I: <0.03 ng/mL (ref <0.03)

## 2016-10-26 NOTE — ED Triage Notes (Signed)
Pt ambulatory to triage with steady gait, no distress noted. Pt c/o chest pressure and palpitations after eating a "bag of candy." Pt has HX of DM, and sts he has felt similar in the past when blood sugar was in high ranges. Pt to ED due to the unresolved chest pain. CBG in triage 197.

## 2016-11-03 ENCOUNTER — Telehealth: Payer: Self-pay | Admitting: *Deleted

## 2016-11-03 NOTE — Telephone Encounter (Signed)
Pt requested a call to discuss the losartan. Pt is requesting another Rx, due to the side effects of feeling as if he's having a heart attack. Pt was seen in the ER because of symptoms, he stopped the medication and has felt better.  Pt contact [phone]

## 2016-11-03 NOTE — Telephone Encounter (Signed)
Need to know how his blood pressure is running now.  Have him spot check his pressure and record.  Send in readings over the next couple of weeks.  Remain off blood pressure medication for now.  Please add to allergy - losartan (chest pain).  Thanks

## 2016-11-03 NOTE — Telephone Encounter (Signed)
Patient started on losartan on 10/19/16 patient started to have chest pain shortly after was seen in ED for chest pain on 10/26/16 after picking up medication with in and hour felt like he was having heart attack. and stopped losartan due medication causing chest pain,  BP that night per patient was 100/66. Patient feels fine now off losartan but asking should he be taking something else.

## 2016-11-04 NOTE — Telephone Encounter (Signed)
Patient notified and voiced understanding, patient will spot check BP and report to PCP. Losartan removed from med list and added to allergies.

## 2016-11-15 ENCOUNTER — Telehealth: Payer: Self-pay | Admitting: Internal Medicine

## 2016-11-15 ENCOUNTER — Other Ambulatory Visit: Payer: Self-pay | Admitting: Internal Medicine

## 2016-11-15 NOTE — Telephone Encounter (Signed)
Pt called requesting a refill on his clonazePAM (KLONOPIN) 0.5 MG tablet. Please advise, thank you!  [place], [place]  Call pt @ [phone]

## 2016-11-15 NOTE — Telephone Encounter (Signed)
Can we fill?

## 2016-11-16 MED ORDER — CLONAZEPAM 0.5 MG PO TABS: 0.5000 mg | ORAL_TABLET | Freq: Two times a day (BID) | ORAL | 0 refills | Status: DC | PRN

## 2016-11-16 NOTE — Telephone Encounter (Signed)
ok'd refill for clonazepam x 1.  Will d/w him at his upcoming appt regarding continuing and dose.

## 2016-11-16 NOTE — Telephone Encounter (Signed)
Refill faxed to pharmacy.

## 2016-12-14 ENCOUNTER — Other Ambulatory Visit: Payer: Self-pay | Admitting: Internal Medicine

## 2016-12-16 ENCOUNTER — Ambulatory Visit (INDEPENDENT_AMBULATORY_CARE_PROVIDER_SITE_OTHER): Payer: BLUE CROSS/BLUE SHIELD | Admitting: Internal Medicine

## 2016-12-16 ENCOUNTER — Encounter: Payer: Self-pay | Admitting: Internal Medicine

## 2016-12-16 VITALS — BP 136/78 | HR 75 | Temp 98.6°F | Resp 16 | Ht 71.0 in | Wt 258.0 lb

## 2016-12-16 DIAGNOSIS — I10 Essential (primary) hypertension: Secondary | ICD-10-CM | POA: Diagnosis not present

## 2016-12-16 DIAGNOSIS — Z23 Encounter for immunization: Secondary | ICD-10-CM

## 2016-12-16 DIAGNOSIS — F439 Reaction to severe stress, unspecified: Secondary | ICD-10-CM

## 2016-12-16 DIAGNOSIS — E119 Type 2 diabetes mellitus without complications: Secondary | ICD-10-CM

## 2016-12-16 DIAGNOSIS — E7800 Pure hypercholesterolemia, unspecified: Secondary | ICD-10-CM | POA: Diagnosis not present

## 2016-12-16 DIAGNOSIS — R079 Chest pain, unspecified: Secondary | ICD-10-CM | POA: Diagnosis not present

## 2016-12-16 MED ORDER — AMLODIPINE BESYLATE 5 MG PO TABS: 5.0000 mg | ORAL_TABLET | Freq: Every day | ORAL | 1 refills | Status: DC

## 2016-12-16 MED ORDER — MONTELUKAST SODIUM 10 MG PO TABS: 10.0000 mg | ORAL_TABLET | Freq: Every day | ORAL | 3 refills | Status: DC

## 2016-12-16 MED ORDER — BUDESONIDE-FORMOTEROL FUMARATE 160-4.5 MCG/ACT IN AERO: 2.0000 | INHALATION_SPRAY | Freq: Two times a day (BID) | RESPIRATORY_TRACT | 1 refills | Status: DC

## 2016-12-16 NOTE — Progress Notes (Signed)
Patient ID: Francisco Jackson, male   DOB: January 18, 1976, 41 y.o.   MRN: [id number]   Subjective:    Patient ID: Francisco Jackson, male    DOB: June 20, 1976, 41 y.o.   MRN: [id number]  HPI  Patient here for a scheduled follow up.  Went to the ER on 10/26/16 for chest pain.  Checked in and had labs and ekg, but was not seen.  States he has noticed his heart beating fast.  Occurs intermittently.  The day he went to the ER, he noticed some chest tightness and some dizziness.  He was not evaluated in the ER.  Symptoms improved.  He will notice intermittently some chest discomfort.  Will also notice intermittent increased heart rate.  Notices being light headed if gets up fast.  Breathing overall stable.  No acid reflux.  States his sugars are doing better.  Brought in no recorded sugar readings.  Blood sugar averaging 130-140s in the am and lower in the pm.  States his pressure is averaging 130/90-100.  He stopped his losartan because he thought was having a reaction to the medication.  Still with increased stress.  Taking clonazepam which was started by outside physician.  Discussed my desire for psychiatric evaluation and medication adjustment.  He agrees.     Past Medical History:  Diagnosis Date  . Asthma   . Diabetes mellitus without complication [facility])    Past Surgical History:  Procedure Laterality Date  . ANTERIOR CRUCIATE LIGAMENT REPAIR Right 2006  . [place] SURGERY  2010  . MENISCUS REPAIR Left 1996   Family History  Problem Relation Age of Onset  . Diabetes Mother   . Diabetes Paternal Aunt   . Diabetes Paternal Uncle   . Diabetes Maternal Grandmother   . Diabetes Maternal Grandfather    Social History   Social History  . Marital status: Married    Spouse name: N/A  . Number of children: N/A  . Years of education: N/A   Social History Main Topics  . Smoking status: Former Smoker    Packs/day: 0.50    Years: 10.00    Types: Cigarettes    Quit date: 10/11/2002  .  Smokeless tobacco: Former Systems developer    Types: Snuff    Quit date: 10/11/2009  . Alcohol use 1.2 oz/week    2 Glasses of wine per week     Comment: ocassional  . Drug use: Unknown  . Sexual activity: Not Asked   Other Topics Concern  . None   Social History Narrative  . None    Outpatient Encounter Prescriptions as of 12/16/2016  Medication Sig  . albuterol (PROVENTIL HFA;VENTOLIN HFA) 108 (90 BASE) MCG/ACT inhaler Inhale 1-2 puffs into the lungs every 6 (six) hours as needed.  . budesonide-formoterol (SYMBICORT) 160-4.5 MCG/ACT inhaler Inhale 2 puffs into the lungs 2 (two) times daily.  . clonazePAM (KLONOPIN) 0.5 MG tablet Take 1 tablet (0.5 mg total) by mouth 2 (two) times daily as needed.  . fexofenadine (ALLEGRA) 180 MG tablet TAKE 1 TABLET BY MOUTH DAILY  . fluticasone (FLONASE) 50 MCG/ACT nasal spray USE 2 SPRAYS IN BOTH NOSTRILS DAILY  . Meloxicam (VIVLODEX) 10 MG CAPS Take 10 mg by mouth daily.  . metFORMIN (GLUCOPHAGE-XR) 500 MG 24 hr tablet TAKE 1 TABLET BY MOUTH TWICE DAILY BEFORE A MEAL  . montelukast (SINGULAIR) 10 MG tablet Take 1 tablet (10 mg total) by mouth at bedtime.  . [DISCONTINUED] budesonide-formoterol (SYMBICORT) 160-4.5 MCG/ACT inhaler Inhale 2  puffs into the lungs 2 (two) times daily.  . [DISCONTINUED] montelukast (SINGULAIR) 10 MG tablet Take 10 mg by mouth at bedtime.  Marland Kitchen amLODipine (NORVASC) 5 MG tablet Take 1 tablet (5 mg total) by mouth daily.   No facility-administered encounter medications on file as of 12/16/2016.     Review of Systems  Constitutional: Negative for appetite change.       Has gained weight.   HENT: Negative for congestion and sinus pressure.   Respiratory: Positive for chest tightness. Negative for cough and shortness of breath.   Cardiovascular: Positive for chest pain. Negative for palpitations and leg swelling.  Gastrointestinal: Negative for abdominal pain, diarrhea, nausea and vomiting.  Genitourinary: Negative for difficulty  urinating and dysuria.  Musculoskeletal: Negative for back pain and joint swelling.  Skin: Negative for color change and rash.  Neurological: Negative for dizziness, light-headedness and headaches.  Psychiatric/Behavioral: Negative for agitation and dysphoric mood.       Objective:     Blood pressure rechecked by me:  130/96  Physical Exam  Constitutional: He appears well-developed and well-nourished. No distress.  HENT:  Nose: Nose normal.  Mouth/Throat: Oropharynx is clear and moist.  Neck: Neck supple. No thyromegaly present.  Cardiovascular: Normal rate and regular rhythm.   Pulmonary/Chest: Effort normal and breath sounds normal. No respiratory distress.  Abdominal: Soft. Bowel sounds are normal. There is no tenderness.  Musculoskeletal: He exhibits no edema or tenderness.  Lymphadenopathy:    He has no cervical adenopathy.  Skin: No rash noted. No erythema.  Psychiatric: He has a normal mood and affect. His behavior is normal.    BP 136/78 (BP Location: Left Arm, Patient Position: Sitting, Cuff Size: Large)   Pulse 75   Temp 98.6 F (37 C) (Oral)   Resp 16   Ht 5\' 11"  (1.803 m)   Wt 258 lb (117 kg)   SpO2 96%   BMI 35.98 kg/m  Wt Readings from Last 3 Encounters:  12/16/16 258 lb (117 kg)  10/26/16 247 lb (112 kg)  10/19/16 255 lb (115.7 kg)     Lab Results  Component Value Date   WBC 9.8 10/26/2016   HGB 16.4 10/26/2016   HCT 47.6 10/26/2016   PLT 282 10/26/2016   GLUCOSE 198 (H) 10/26/2016   CHOL 217 (A) 07/23/2016   TRIG 106 07/23/2016   HDL 30 (A) 07/23/2016   LDLCALC 166 07/23/2016   ALT 34 07/23/2016   AST 20 07/23/2016   NA 136 10/26/2016   K 3.8 10/26/2016   CL 104 10/26/2016   CREATININE 1.21 10/26/2016   BUN 19 10/26/2016   CO2 24 10/26/2016   TSH 1.73 07/23/2016   HGBA1C 9.9 07/23/2016    Dg Chest 2 View  Result Date: 10/26/2016 CLINICAL DATA:  Heart palpitation EXAM: CHEST  2 VIEW COMPARISON:  07/05/2016 FINDINGS: Partially  visualized cervical spine hardware. No acute infiltrate or effusion. Normal cardiomediastinal silhouette. No pneumothorax. IMPRESSION: No active cardiopulmonary disease. Electronically Signed   By: Donavan Foil M.D.   On: 10/26/2016 20:14       Assessment & Plan:   Problem List Items Addressed This Visit    Chest pain - Primary    EKG obtained given intermittent chest pain and increased heart rate.  EKD - SR with flattening of Ts in III.  No acute ischemic changes.  Given symptoms and risk factors, will refer to cardiology for further evaluation and w/up.  Pt in agreement.  Continue risk factor  modification.        Relevant Orders   EKG 12-Lead (Completed)   Ambulatory referral to Cardiology   Diabetes mellitus ([place])    Low carb diet and exercise.  Discussed the need for weight loss.  On metformin.  States sugars improved as outlined.  Check metabolic panel and H0Q. Needs eye exam.       Essential hypertension    Blood pressure remaining elevated.  He felt had reaction to losartan.  Will start amlodipine 5mg  q day.  Follow pressures.  Get him back in soon to reassess.  Check metabolic panel.        Relevant Medications   amLODipine (NORVASC) 5 MG tablet   Hypercholesterolemia    Low cholesterol diet and exercise.  Check lipid panel.  If elevation, will need cholesterol medication.        Relevant Medications   amLODipine (NORVASC) 5 MG tablet   Stress    Increased stress as outlined.  Currently maintained on clonazepam.  Was prescribed by outside physician.  Discussed the need for psych evaluation and medication adjustment.  He is in agreement.  Order placed for psych referral.        Relevant Orders   Ambulatory referral to Psychiatry    Other Visit Diagnoses    Encounter for immunization       Relevant Medications   budesonide-formoterol (SYMBICORT) 160-4.5 MCG/ACT inhaler   montelukast (SINGULAIR) 10 MG tablet   amLODipine (NORVASC) 5 MG tablet   Other Relevant Orders     EKG 12-Lead (Completed)   Ambulatory referral to Psychiatry   Flu Vaccine QUAD 36+ mos IM (Completed)       Einar Pheasant, MD

## 2016-12-16 NOTE — Progress Notes (Signed)
Pre-visit discussion using our clinic review tool. No additional management support is needed unless otherwise documented below in the visit note.  

## 2016-12-18 ENCOUNTER — Other Ambulatory Visit: Payer: Self-pay | Admitting: Internal Medicine

## 2016-12-20 ENCOUNTER — Encounter: Payer: Self-pay | Admitting: Internal Medicine

## 2016-12-20 ENCOUNTER — Other Ambulatory Visit: Payer: Self-pay | Admitting: Internal Medicine

## 2016-12-20 DIAGNOSIS — R079 Chest pain, unspecified: Secondary | ICD-10-CM | POA: Insufficient documentation

## 2016-12-20 NOTE — Assessment & Plan Note (Signed)
EKG obtained given intermittent chest pain and increased heart rate.  EKD - SR with flattening of Ts in III.  No acute ischemic changes.  Given symptoms and risk factors, will refer to cardiology for further evaluation and w/up.  Pt in agreement.  Continue risk factor modification.

## 2016-12-20 NOTE — Assessment & Plan Note (Signed)
Blood pressure remaining elevated.  He felt had reaction to losartan.  Will start amlodipine 5mg  q day.  Follow pressures.  Get him back in soon to reassess.  Check metabolic panel.

## 2016-12-20 NOTE — Assessment & Plan Note (Signed)
Increased stress as outlined.  Currently maintained on clonazepam.  Was prescribed by outside physician.  Discussed the need for psych evaluation and medication adjustment.  He is in agreement.  Order placed for psych referral.

## 2016-12-20 NOTE — Assessment & Plan Note (Signed)
Low cholesterol diet and exercise.  Check lipid panel.  If elevation, will need cholesterol medication.

## 2016-12-20 NOTE — Assessment & Plan Note (Signed)
Low carb diet and exercise.  Discussed the need for weight loss.  On metformin.  States sugars improved as outlined.  Check metabolic panel and W2N. Needs eye exam.

## 2016-12-20 NOTE — Telephone Encounter (Signed)
rx ok'd for clonazepam #60 with no refills.   

## 2016-12-20 NOTE — Telephone Encounter (Signed)
Last refill was in Feb 2018, last office visit last week.  Please advise refill, thanks

## 2016-12-21 ENCOUNTER — Telehealth: Payer: Self-pay | Admitting: Internal Medicine

## 2016-12-21 NOTE — Telephone Encounter (Signed)
Pt wife called and stated that pt could not get in with Dr. Nicolasa Ducking until May. They wanted to know if there was anyone else that you could recommend. Please advise, thank you!  Call [place] @ [phone]  PS - As far as the cardiology referral they prefer that you call the spouse to help schedule appts.

## 2016-12-21 NOTE — Telephone Encounter (Signed)
Faxed to pharmacy

## 2016-12-21 NOTE — Telephone Encounter (Signed)
Left message to return call to our office.  

## 2016-12-21 NOTE — Telephone Encounter (Signed)
Please advise 

## 2016-12-21 NOTE — Telephone Encounter (Signed)
Please notify Kostas that I have called Dr Waylan Boga office and they may be calling him with an earlier appt.  See me before calling pt.  Thanks

## 2016-12-22 ENCOUNTER — Encounter (INDEPENDENT_AMBULATORY_CARE_PROVIDER_SITE_OTHER): Payer: Self-pay

## 2016-12-22 LAB — LIPID PANEL
Cholesterol: 209 mg/dL — AB (ref 0–200)
HDL: 36 mg/dL (ref 35–70)
LDL Cholesterol: 152 mg/dL
Triglycerides: 105 mg/dL (ref 40–160)

## 2016-12-22 LAB — MICROALBUMIN, URINE: Microalb, Ur: 138.8

## 2016-12-22 LAB — CBC AND DIFFERENTIAL
HCT: 48 % (ref 41–53)
Hemoglobin: 16.6 g/dL (ref 13.5–17.5)
Neutrophils Absolute: 51 /uL
Platelets: 243 10*3/uL (ref 150–399)
WBC: 7.3 10^3/mL

## 2016-12-22 LAB — HEPATIC FUNCTION PANEL
ALT: 40 U/L (ref 10–40)
AST: 25 U/L (ref 14–40)
Alkaline Phosphatase: 57 U/L (ref 25–125)
Bilirubin, Total: 0.4 mg/dL

## 2016-12-22 LAB — BASIC METABOLIC PANEL: Glucose: 141 mg/dL

## 2016-12-22 LAB — TSH: TSH: 2.64 u[IU]/mL (ref <=5.90)

## 2016-12-22 LAB — HEMOGLOBIN A1C: Hemoglobin A1C: 6.5

## 2016-12-22 NOTE — Telephone Encounter (Signed)
Pt spouse called back returning your call.  Call @ [phone]

## 2016-12-22 NOTE — Telephone Encounter (Signed)
Spoke with wife gave information about app with Dr. Nicolasa Ducking. She wanted to let you know that he has been taking the amlodipine 5mg  as dirrected. He has been checking his bp every morning and at night and each reading is around 150/100. She also had questions about cardiology appointment. She has not had any calls for that. If you will let me know who you wanted sent to I can call and f/u on that.

## 2016-12-22 NOTE — Telephone Encounter (Signed)
Spoke to wife she will get his bp off his app on phone and send via my chart with bp from tonight and tomorrow. I have given her the code for activation and contact information for mychart. As well as our fax number she can send it to if has any problems.

## 2016-12-22 NOTE — Telephone Encounter (Signed)
Per chart review, rx was just ok'd on 12/20/16.

## 2016-12-22 NOTE — Telephone Encounter (Signed)
Please advise for Rx, this was prescribed by an outside MD prior. Thanks Patient was just seen last week in the office.

## 2016-12-22 NOTE — Telephone Encounter (Signed)
The referral is in.  I had put in for internal cardiology appt (Dr Fletcher Anon, Dr Rockey Situ, Dr Andrew Au group).  Confirm blood pressure remaining 150/100.  Tell him to send in readings and let me review.  If remaining 150/100, can increase amlodpine to 10mg  q day.

## 2016-12-23 DIAGNOSIS — F1011 Alcohol abuse, in remission: Secondary | ICD-10-CM | POA: Diagnosis not present

## 2016-12-23 DIAGNOSIS — F411 Generalized anxiety disorder: Secondary | ICD-10-CM | POA: Diagnosis not present

## 2016-12-23 DIAGNOSIS — F39 Unspecified mood [affective] disorder: Secondary | ICD-10-CM | POA: Diagnosis not present

## 2016-12-23 DIAGNOSIS — G4701 Insomnia due to medical condition: Secondary | ICD-10-CM | POA: Diagnosis not present

## 2016-12-27 ENCOUNTER — Encounter: Payer: Self-pay | Admitting: Internal Medicine

## 2016-12-27 NOTE — Telephone Encounter (Signed)
Called wife they have not done readings yet but she will call us when she has those.

## 2016-12-27 NOTE — Telephone Encounter (Signed)
Wife has sent following readings in my chart message.:  Blood pressure readings as requested by Joellen:    March 9   (am) 137/92  March 10  (am) 143/97  March 11  (am) 139/94  March 12  (pm) 134/101  March 13  (am) 137/95  March 13  (pm) 142/100  March 14  (am)  150/100  March 14  (pm) 144/89  March 15  (am)  145/98  March 16  (am)  138/88  March 16  (pm)  152/97  March 17  (am)  138/92  March 18  (pm)  145/93

## 2016-12-27 NOTE — Telephone Encounter (Signed)
Given persistent elevation in blood pressure, have him increase amlodipine to 10mg  q day.  Please talk to Francisco Jackson if possible.

## 2016-12-28 ENCOUNTER — Telehealth: Payer: Self-pay

## 2016-12-28 DIAGNOSIS — E7800 Pure hypercholesterolemia, unspecified: Secondary | ICD-10-CM

## 2016-12-28 NOTE — Telephone Encounter (Signed)
Patient advised of below and verbalized understanding and will monitor blood pressure.

## 2016-12-28 NOTE — Telephone Encounter (Signed)
Put labs in your box for review need to abstract after you have seen them.

## 2016-12-29 ENCOUNTER — Encounter: Payer: Self-pay | Admitting: Cardiology

## 2016-12-29 ENCOUNTER — Ambulatory Visit (INDEPENDENT_AMBULATORY_CARE_PROVIDER_SITE_OTHER): Payer: BLUE CROSS/BLUE SHIELD | Admitting: Cardiology

## 2016-12-29 VITALS — BP 136/106 | HR 82 | Ht 71.0 in | Wt 260.2 lb

## 2016-12-29 DIAGNOSIS — R079 Chest pain, unspecified: Secondary | ICD-10-CM

## 2016-12-29 DIAGNOSIS — E784 Other hyperlipidemia: Secondary | ICD-10-CM | POA: Diagnosis not present

## 2016-12-29 DIAGNOSIS — Z6836 Body mass index (BMI) 36.0-36.9, adult: Secondary | ICD-10-CM | POA: Diagnosis not present

## 2016-12-29 DIAGNOSIS — R0602 Shortness of breath: Secondary | ICD-10-CM | POA: Diagnosis not present

## 2016-12-29 DIAGNOSIS — E6609 Other obesity due to excess calories: Secondary | ICD-10-CM

## 2016-12-29 DIAGNOSIS — E7849 Other hyperlipidemia: Secondary | ICD-10-CM

## 2016-12-29 DIAGNOSIS — I10 Essential (primary) hypertension: Secondary | ICD-10-CM | POA: Diagnosis not present

## 2016-12-29 MED ORDER — ROSUVASTATIN CALCIUM 5 MG PO TABS: 5.0000 mg | ORAL_TABLET | Freq: Every day | ORAL | 1 refills | Status: DC

## 2016-12-29 MED ORDER — ASPIRIN EC 81 MG PO TBEC: 81.0000 mg | DELAYED_RELEASE_TABLET | Freq: Every day | ORAL | 3 refills | Status: DC

## 2016-12-29 NOTE — Telephone Encounter (Signed)
Labs reviewed and placed back in box for abstraction.  Notify pt that his overall sugar control is much better - a1c 6.5.  Good job.  Cholesterol is elevated.  I would like to start him on a cholesterol medication.  Start crestor 5mg  q day.  Will need liver panel checked 6 weeks after starting the medication.  Other labs including thyroid test, hgb, kidney function tests and liver function tests are wnl.

## 2016-12-29 NOTE — Telephone Encounter (Signed)
I have sent in rx for crestor and placed order for liver panel at [facility].  He needs to have drawn in 6 weeks - non fasting lab.

## 2016-12-29 NOTE — Telephone Encounter (Signed)
Patient advised of results and verbalized understanding.  He would like script for Crestor  called into [facility].  He states he will have labs in 6 weeks at [place] have been abstracted.

## 2016-12-29 NOTE — Progress Notes (Signed)
Cardiology Office Note   Date:  12/29/2016   ID:  Francisco Jackson, DOB 1975/11/02, MRN [id number]  Referring Doctor:  Einar Pheasant, MD   Cardiologist:   Wende Bushy, MD   Reason for consultation:  Chief Complaint  Patient presents with  . other     NP. referred by Dr.Scott for chest pain. Pt c/o elevated bp, chest tightness. Reviewed meds with pt verbally.      History of Present Illness: Francisco Jackson is a 41 y.o. male who presents for Chest pain  Went to ER 10/26/2016 for chest pain. Also complained of palpitations and dizziness and presyncopal symptoms at that time. This was after taking the first dose of losartan 50 daily. His blood pressure was noted to be much lower than usual, in the 90s at that time. He was not seen but had some labs and EKG. he left before being seen as he already waited 3 hours at least and felt completely better. He stopped the losartan and was then started on amlodipine. Amlodipine still being uptitrated for better blood pressure control.  One month ago, another episode of tightness in the chest, described as heaviness, moderate to severe intensity, associated with palpitations, lasted several hours while he was out shopping. He waited it out and eventually went away. Also some shortness of breath at that time.  3 weeks ago, another episode of chest tightness while moving some stuff for the new house. Lasted an hour and resolved spontaneously.  He mentions that occasion there may be some radiation to the left arm from the chest pain.  No PND, orthopnea, edema. No true syncope. Palpitations occurred with the chest heaviness. Otherwise, no shortness of breath with exertion, only during the time of chest pain.  ROS:  Please see the history of present illness. Aside from mentioned under HPI, all other systems are reviewed and negative.     Past Medical History:  Diagnosis Date  . Asthma   . Diabetes mellitus without complication ([place])   .  Personality disorder    borderline    Past Surgical History:  Procedure Laterality Date  . ANTERIOR CRUCIATE LIGAMENT REPAIR Right 2006  . [place] SURGERY  2010  . MENISCUS REPAIR Left 1996     reports that he quit smoking about 14 years ago. His smoking use included Cigarettes. He has a 5.00 pack-year smoking history. He quit smokeless tobacco use about 7 years ago. His smokeless tobacco use included Snuff. He reports that he drinks about 1.2 oz of alcohol per week . He reports that he does not use drugs.   family history includes Diabetes in his maternal grandfather, maternal grandmother, mother, paternal aunt, paternal uncle, and sister; Heart attack in his father, maternal grandfather, and paternal aunt; Heart disease in his maternal grandfather; Hypertension in his father and sister; Melanoma in his mother.  Father died in 26s MI Mat uncle MI 91s  Outpatient Medications Prior to Visit  Medication Sig Dispense Refill  . albuterol (PROVENTIL HFA;VENTOLIN HFA) 108 (90 BASE) MCG/ACT inhaler Inhale 1-2 puffs into the lungs every 6 (six) hours as needed. 18 g 6  . budesonide-formoterol (SYMBICORT) 160-4.5 MCG/ACT inhaler Inhale 2 puffs into the lungs 2 (two) times daily. 1 Inhaler 1  . clonazePAM (KLONOPIN) 0.5 MG tablet TAKE ONE TABLET BY MOUTH TWICE DAILY AS NEEDED 60 tablet 0  . fexofenadine (ALLEGRA) 180 MG tablet TAKE 1 TABLET BY MOUTH DAILY 90 tablet 3  . fluticasone (FLONASE) 50  MCG/ACT nasal spray USE 2 SPRAYS IN BOTH NOSTRILS DAILY 16 g 3  . Meloxicam (VIVLODEX) 10 MG CAPS Take 10 mg by mouth as needed.     . metFORMIN (GLUCOPHAGE-XR) 500 MG 24 hr tablet TAKE 1 TABLET BY MOUTH TWICE DAILY BEFORE A MEAL 60 tablet 5  . montelukast (SINGULAIR) 10 MG tablet Take 1 tablet (10 mg total) by mouth at bedtime. 30 tablet 3  . amLODipine (NORVASC) 5 MG tablet Take 1 tablet (5 mg total) by mouth daily. (Patient not taking: Reported on 12/29/2016) 30 tablet 1   No facility-administered  medications prior to visit.      Allergies: Losartan    PHYSICAL EXAM: VS:  BP (!) 136/106 (BP Location: Right Arm, Patient Position: Sitting, Cuff Size: Normal)   Pulse 82   Ht 5\' 11"  (1.803 m)   Wt 260 lb 4 oz (118 kg)   BMI 36.30 kg/m  , Body mass index is 36.3 kg/m. Wt Readings from Last 3 Encounters:  12/29/16 260 lb 4 oz (118 kg)  12/16/16 258 lb (117 kg)  10/26/16 247 lb (112 kg)    GENERAL:  well developed, well nourished,obese, not in acute distress HEENT: normocephalic, pink conjunctivae, anicteric sclerae, no xanthelasma, normal dentition, oropharynx clear NECK:  no neck vein engorgement, JVP normal, no hepatojugular reflux, carotid upstroke brisk and symmetric, no bruit, no thyromegaly, no lymphadenopathy LUNGS:  good respiratory effort, clear to auscultation bilaterally CV:  PMI not displaced, no thrills, no lifts, S1 and S2 within normal limits, no palpable S3 or S4, no murmurs, no rubs, no gallops ABD:  Soft, nontender, nondistended, normoactive bowel sounds, no abdominal aortic bruit, no hepatomegaly, no splenomegaly MS: nontender back, no kyphosis, no scoliosis, no joint deformities EXT:  2+ DP/PT pulses, no edema, no varicosities, no cyanosis, no clubbing SKIN: warm, nondiaphoretic, normal turgor, no ulcers NEUROPSYCH: alert, oriented to person, place, and time, sensory/motor grossly intact, normal mood, appropriate affect  Recent Labs: 10/26/2016: BUN 19; Creatinine, Ser 1.21; Potassium 3.8; Sodium 136 12/22/2016: ALT 40; Hemoglobin 16.6; Platelets 243; TSH 2.64   Lipid Panel    Component Value Date/Time   CHOL 209 (A) 12/22/2016   TRIG 105 12/22/2016   HDL 36 12/22/2016   [place] 152 12/22/2016     Other studies Reviewed:  EKG:  The ekg from 12/16/2016 was personally reviewed by me and it revealed sinus rhythm 70 BPM. Left axis deviation. Abnormal EKG.  EKG from 12/29/2016 was personally reviewed by me and it revealed sinus rhythm, 82 BPM. Left axis  deviation. Possible LVH.  Additional studies/ records that were reviewed personally reviewed by me today include: None available   ASSESSMENT AND PLAN: Chest pain, palpitations, shortness breath Risk factors for CAD include diabetes, hypertension, hyperlipidemia, family history of premature CAD Recommend further evaluation with echocardiogram and exercise nuclear stress test, with potential switch to pharmacologic nuclear stress is if blood pressure too high and unable to reach target heart rate. Start aspirin 81 mg by mouth daily Patient to call 911 for unrelenting chest pain.  Hypertension Being managed by PCP. Agree with uptitrating amlodipine. Discussed importance of reevaluating sleep apnea. He was told that he has sleep apnea many years ago. Discussed importance dietary modification including sodium restriction. May need another antihypertensive medication, possibly a beta blocker, if patient unable to tolerate ACE inhibitor, goal is less than 130/80.  Hyperlipidemia Again being managed by PCP. Patient was just called in a prescription for statin by PCP. Agree with statin  therapy. Ideal LDL goal is less than 70.  Obesity Body mass index is 36.3 kg/m. Recommend aggressive weight loss through diet and increased physical activity. Once cardiac workup is completed    Current medicines are reviewed at length with the patient today.  The patient does not have concerns regarding medicines.  Labs/ tests ordered today include:  Orders Placed This Encounter  Procedures  . NM Myocar Multi W/Spect W/Wall Motion / EF  . EKG 12-Lead  . ECHOCARDIOGRAM COMPLETE    I had a lengthy and detailed discussion with the patient regarding diagnoses, prognosis, diagnostic options, treatment options , and side effects of medications.   I counseled the patient on importance of lifestyle modification including heart healthy diet, regular physical activity Once cardiac workup is  completed.   Disposition:   FU with Cardiology after tests   Thank you for this consultation. We will forwarding this consultation to referring physician.   Signed, Wende Bushy, MD  12/29/2016 11:17 AM    [place]  This note was generated in part with voice recognition software and I apologize for any typographical errors that were not detected and corrected.

## 2016-12-29 NOTE — Patient Instructions (Addendum)
Medication Instructions:  Your physician has recommended you make the following change in your medication:  1. START Aspirin 81 mg once daily   Testing/Procedures: Your physician has requested that you have an echocardiogram. Echocardiography is a painless test that uses sound waves to create images of your heart. It provides your doctor with information about the size and shape of your heart and how well your heart's chambers and valves are working. This procedure takes approximately one hour. There are no restrictions for this procedure.  [place]  Your caregiver has ordered a Stress Test with nuclear imaging. The purpose of this test is to evaluate the blood supply to your heart muscle. This procedure is referred to as a "Non-Invasive Stress Test." This is because other than having an IV started in your vein, nothing is inserted or "invades" your body. Cardiac stress tests are done to find areas of poor blood flow to the heart by determining the extent of coronary artery disease (CAD). Some patients exercise on a treadmill, which naturally increases the blood flow to your heart, while others who are  unable to walk on a treadmill due to physical limitations have a pharmacologic/chemical stress agent called Lexiscan . This medicine will mimic walking on a treadmill by temporarily increasing your coronary blood flow.   Please note: these test may take anywhere between 2-4 hours to complete  PLEASE REPORT TO [facility] AT THE FIRST DESK WILL DIRECT YOU WHERE TO GO  Date of Procedure:_Tuesday January 04, 2017 at 57:30AM_  Arrival Time for Procedure:_Arrive at 07:15AM to register__  Instructions regarding medication:   __X__ : Hold diabetes medication Metformin (Glucophage) the morning of procedure    PLEASE NOTIFY THE OFFICE AT LEAST 24 HOURS IN ADVANCE IF YOU ARE UNABLE TO KEEP YOUR APPOINTMENT.  [phone] AND  PLEASE NOTIFY NUCLEAR MEDICINE AT [facility]  AT LEAST 24 HOURS IN ADVANCE IF YOU ARE UNABLE TO KEEP YOUR APPOINTMENT. [phone]  How to prepare for your Myoview test:  1. Do not eat or drink after midnight 2. No caffeine for 24 hours prior to test 3. No smoking 24 hours prior to test. 4. Your medication may be taken with water.  If your doctor stopped a medication because of this test, do not take that medication. 5. Ladies, please do not wear dresses.  Skirts or pants are appropriate. Please wear a short sleeve shirt. 6. No perfume, cologne or lotion. 7. Wear comfortable walking shoes. No heels!   Follow-Up: Your physician recommends that you schedule a follow-up appointment after testing with Dr. Yvone Neu.   It was a pleasure seeing you today here in the office. Please do not hesitate to give [place] a call back if you have any further questions. Ginger Blue, BSN   Echocardiogram An echocardiogram, or echocardiography, uses sound waves (ultrasound) to produce an image of your heart. The echocardiogram is simple, painless, obtained within a short period of time, and offers valuable information to your health care provider. The images from an echocardiogram can provide information such as:  Evidence of coronary artery disease (CAD).  Heart size.  Heart muscle function.  Heart valve function.  Aneurysm detection.  Evidence of a past heart attack.  Fluid buildup around the heart.  Heart muscle thickening.  Assess heart valve function. Tell a health care provider about:  Any allergies you have.  All medicines you are taking, including vitamins, herbs, eye drops, creams, and over-the-counter medicines.  Any problems you or family members have had with anesthetic medicines.  Any blood disorders you have.  Any surgeries you have had.  Any medical conditions you have.  Whether you are pregnant or may be pregnant. What happens before the procedure? No special preparation is needed. Eat and drink  normally. What happens during the procedure?  In order to produce an image of your heart, gel will be applied to your chest and a wand-like tool (transducer) will be moved over your chest. The gel will help transmit the sound waves from the transducer. The sound waves will harmlessly bounce off your heart to allow the heart images to be captured in real-time motion. These images will then be recorded.  You may need an IV to receive a medicine that improves the quality of the pictures. What happens after the procedure? You may return to your normal schedule including diet, activities, and medicines, unless your health care provider tells you otherwise. This information is not intended to replace advice given to you by your health care provider. Make sure you discuss any questions you have with your health care provider. Document Released: 09/24/2000 Document Revised: 05/15/2016 Document Reviewed: 06/04/2013 Elsevier Interactive Patient Education  2017 [place]. Pharmacologic Stress Electrocardiogram Introduction A pharmacologic stress electrocardiogram is a heart (cardiac) test that uses nuclear imaging to evaluate the blood supply to your heart. This test may also be called a pharmacologic stress electrocardiography. Pharmacologic means that a medicine is used to increase your heart rate and blood pressure. This stress test is done to find areas of poor blood flow to the heart by determining the extent of coronary artery disease (CAD). Some people exercise on a treadmill, which naturally increases the blood flow to the heart. For those people unable to exercise on a treadmill, a medicine is used. This medicine stimulates your heart and will cause your heart to beat harder and more quickly, as if you were exercising. Pharmacologic stress tests can help determine:  The adequacy of blood flow to your heart during increased levels of activity in order to clear you for discharge home.  The extent  of coronary artery blockage caused by CAD.  Your prognosis if you have suffered a heart attack.  The effectiveness of cardiac procedures done, such as an angioplasty, which can increase the circulation in your coronary arteries.  Causes of chest pain or pressure. LET [facility] CARE PROVIDER KNOW ABOUT:  Any allergies you have.  All medicines you are taking, including vitamins, herbs, eye drops, creams, and over-the-counter medicines.  Previous problems you or members of your family have had with the use of anesthetics.  Any blood disorders you have.  Previous surgeries you have had.  Medical conditions you have.  Possibility of pregnancy, if this applies.  If you are currently breastfeeding. RISKS AND COMPLICATIONS Generally, this is a safe procedure. However, as with any procedure, complications can occur. Possible complications include:  You develop pain or pressure in the following areas:  Chest.  Jaw or neck.  Between your shoulder blades.  Radiating down your left arm.  Headache.  Dizziness or light-headedness.  Shortness of breath.  Increased or irregular heartbeat.  Low blood pressure.  Nausea or vomiting.  Flushing.  Redness going up the arm and slight pain during injection of medicine.  Heart attack (rare). BEFORE THE PROCEDURE  Avoid all forms of caffeine for 24 hours before your test or as directed by your health care provider. This includes coffee, tea (even  decaffeinated tea), caffeinated sodas, chocolate, cocoa, and certain pain medicines.  Follow your health care provider's instructions regarding eating and drinking before the test.  Take your medicines as directed at regular times with water unless instructed otherwise. Exceptions may include:  If you have diabetes, ask how you are to take your insulin or pills. It is common to adjust insulin dosing the morning of the test.  If you are taking beta-blocker medicines, it is important to  talk to your health care provider about these medicines well before the date of your test. Taking beta-blocker medicines may interfere with the test. In some cases, these medicines need to be changed or stopped 24 hours or more before the test.  If you wear a nitroglycerin patch, it may need to be removed prior to the test. Ask your health care provider if the patch should be removed before the test.  If you use an inhaler for any breathing condition, bring it with you to the test.  If you are an outpatient, bring a snack so you can eat right after the stress phase of the test.  Do not smoke for 4 hours prior to the test or as directed by your health care provider.  Do not apply lotions, powders, creams, or oils on your chest prior to the test.  Wear comfortable shoes and clothing. Let your health care provider know if you were unable to complete or follow the preparations for your test. PROCEDURE  Multiple patches (electrodes) will be put on your chest. If needed, small areas of your chest may be shaved to get better contact with the electrodes. Once the electrodes are attached to your body, multiple wires will be attached to the electrodes, and your heart rate will be monitored.  An IV access will be started. A nuclear trace (isotope) is given. The isotope may be given intravenously, or it may be swallowed. Nuclear refers to several types of radioactive isotopes, and the nuclear isotope lights up the arteries so that the nuclear images are clear. The isotope is absorbed by your body. This results in low radiation exposure.  A resting nuclear image is taken to show how your heart functions at rest.  A medicine is given through the IV access.  A second scan is done about 1 hour after the medicine injection and determines how your heart functions under stress.  During this stress phase, you will be connected to an electrocardiogram machine. Your blood pressure and oxygen levels will be  monitored. What to expect after the procedure  Your heart rate and blood pressure will be monitored after the test.  You may return to your normal schedule, including diet,activities, and medicines, unless your health care provider tells you otherwise. This information is not intended to replace advice given to you by your health care provider. Make sure you discuss any questions you have with your health care provider. Document Released: 02/13/2009 Document Revised: 03/04/2016 Document Reviewed: 04/05/2016 Elsevier Interactive Patient Education  2017 [employer].

## 2016-12-29 NOTE — Telephone Encounter (Signed)
Called patient given all information will call if any questions. Will have labs done as directed.

## 2017-01-04 ENCOUNTER — Encounter
Admission: RE | Admit: 2017-01-04 | Discharge: 2017-01-04 | Disposition: A | Discharge status: Home / Self Care | Payer: BLUE CROSS/BLUE SHIELD | Source: Ambulatory Visit | Attending: Cardiology | Admitting: Cardiology

## 2017-01-04 ENCOUNTER — Other Ambulatory Visit: Payer: Self-pay | Admitting: Internal Medicine

## 2017-01-04 DIAGNOSIS — R079 Chest pain, unspecified: Secondary | ICD-10-CM | POA: Insufficient documentation

## 2017-01-04 DIAGNOSIS — R0602 Shortness of breath: Secondary | ICD-10-CM | POA: Diagnosis not present

## 2017-01-04 MED ORDER — TECHNETIUM TC 99M TETROFOSMIN IV KIT
13.0000 | PACK | Freq: Once | INTRAVENOUS | Status: AC | PRN
  Administered 2017-01-04: 12.97 via INTRAVENOUS

## 2017-01-04 MED ORDER — TECHNETIUM TC 99M TETROFOSMIN IV KIT
31.3320 | PACK | Freq: Once | INTRAVENOUS | Status: AC | PRN
  Administered 2017-01-04: 31.332 via INTRAVENOUS

## 2017-01-05 DIAGNOSIS — F1011 Alcohol abuse, in remission: Secondary | ICD-10-CM | POA: Diagnosis not present

## 2017-01-05 DIAGNOSIS — F411 Generalized anxiety disorder: Secondary | ICD-10-CM | POA: Diagnosis not present

## 2017-01-05 DIAGNOSIS — F39 Unspecified mood [affective] disorder: Secondary | ICD-10-CM | POA: Diagnosis not present

## 2017-01-05 DIAGNOSIS — G4701 Insomnia due to medical condition: Secondary | ICD-10-CM | POA: Diagnosis not present

## 2017-01-05 LAB — NM MYOCAR MULTI W/SPECT W/WALL MOTION / EF
Estimated workload: 11.7 METS
LV dias vol: 67 mL (ref 62–150)
LV sys vol: 22 mL
Peak HR: 157 {beats}/min
Percent of predicted max HR: 88 %
SDS: 0
SRS: 0
SSS: 0
Stage 1 Grade: 0 %
Stage 1 HR: 91 {beats}/min
Stage 1 Speed: 0 mph
Stage 2 DBP: 76 mmHg
Stage 2 Grade: 10 %
Stage 2 HR: 112 {beats}/min
Stage 2 SBP: 148 mmHg
Stage 2 Speed: 1.7 mph
Stage 3 DBP: 88 mmHg
Stage 3 Grade: 12 %
Stage 3 HR: 131 {beats}/min
Stage 3 SBP: 146 mmHg
Stage 3 Speed: 2.5 mph
Stage 4 Grade: 14 %
Stage 4 HR: 150 {beats}/min
Stage 4 Speed: 3.4 mph
Stage 5 Grade: 16 %
Stage 5 HR: 157 {beats}/min
Stage 5 Speed: 4.2 mph
Stage 6 Grade: 0 %
Stage 6 HR: 136 {beats}/min
Stage 6 Speed: 0 mph
Stage 7 DBP: 71 mmHg
Stage 7 Grade: 0 %
Stage 7 HR: 104 {beats}/min
Stage 7 SBP: 139 mmHg
Stage 7 Speed: 0 mph
TID: 0.78

## 2017-01-25 ENCOUNTER — Other Ambulatory Visit: Payer: Self-pay | Admitting: Internal Medicine

## 2017-01-25 DIAGNOSIS — F1011 Alcohol abuse, in remission: Secondary | ICD-10-CM | POA: Diagnosis not present

## 2017-01-25 DIAGNOSIS — F39 Unspecified mood [affective] disorder: Secondary | ICD-10-CM | POA: Diagnosis not present

## 2017-01-25 DIAGNOSIS — F411 Generalized anxiety disorder: Secondary | ICD-10-CM | POA: Diagnosis not present

## 2017-01-25 DIAGNOSIS — G4701 Insomnia due to medical condition: Secondary | ICD-10-CM | POA: Diagnosis not present

## 2017-02-02 ENCOUNTER — Ambulatory Visit (INDEPENDENT_AMBULATORY_CARE_PROVIDER_SITE_OTHER): Payer: BLUE CROSS/BLUE SHIELD | Admitting: Internal Medicine

## 2017-02-02 ENCOUNTER — Encounter: Payer: Self-pay | Admitting: Internal Medicine

## 2017-02-02 DIAGNOSIS — R079 Chest pain, unspecified: Secondary | ICD-10-CM

## 2017-02-02 DIAGNOSIS — E119 Type 2 diabetes mellitus without complications: Secondary | ICD-10-CM | POA: Diagnosis not present

## 2017-02-02 DIAGNOSIS — I10 Essential (primary) hypertension: Secondary | ICD-10-CM

## 2017-02-02 DIAGNOSIS — F439 Reaction to severe stress, unspecified: Secondary | ICD-10-CM

## 2017-02-02 DIAGNOSIS — E7800 Pure hypercholesterolemia, unspecified: Secondary | ICD-10-CM

## 2017-02-02 NOTE — Progress Notes (Signed)
Pre-visit discussion using our clinic review tool. No additional management support is needed unless otherwise documented below in the visit note.  

## 2017-02-02 NOTE — Progress Notes (Signed)
Patient ID: Francisco Jackson, male   DOB: 12-04-1975, 41 y.o.   MRN: [id number]   Subjective:    Patient ID: Francisco Jackson, male    DOB: Apr 02, 1976, 41 y.o.   MRN: [id number]  HPI  Patient here for a scheduled follow up.  He reports he is doing better.  Seeing psychiatry.  On depakote and clonazepam.  Family issues better. Seeing cardiology.  Planning for echo tomorrow.  Had nuclear stress test - ok.  States he overall feels better.  Trying to watch his diet.  Sugars are better.  States am sugars averaging 120-140.  PM sugars lower.  No acid reflux.  No abdominal pain.  Bowels moving.  Blood pressure doing better.    Past Medical History:  Diagnosis Date  . Asthma   . Diabetes mellitus without complication ([place])   . Personality disorder    borderline   Past Surgical History:  Procedure Laterality Date  . ANTERIOR CRUCIATE LIGAMENT REPAIR Right 2006  . [place] SURGERY  2010  . MENISCUS REPAIR Left 1996   Family History  Problem Relation Age of Onset  . Diabetes Mother   . Melanoma Mother   . Diabetes Paternal Aunt   . Heart attack Paternal Aunt   . Diabetes Paternal Uncle   . Diabetes Maternal Grandmother   . Diabetes Maternal Grandfather   . Heart disease Maternal Grandfather   . Heart attack Maternal Grandfather   . Heart attack Father   . Hypertension Father   . Diabetes Sister   . Hypertension Sister    Social History   Social History  . Marital status: Married    Spouse name: N/A  . Number of children: N/A  . Years of education: N/A   Social History Main Topics  . Smoking status: Former Smoker    Packs/day: 0.50    Years: 10.00    Types: Cigarettes    Quit date: 10/11/2002  . Smokeless tobacco: Former Systems developer    Types: Snuff    Quit date: 10/11/2009  . Alcohol use 1.2 oz/week    2 Glasses of wine per week     Comment: ocassional  . Drug use: No  . Sexual activity: Not Asked   Other Topics Concern  . None   Social History Narrative  . None     Outpatient Encounter Prescriptions as of 02/02/2017  Medication Sig  . albuterol (PROVENTIL HFA;VENTOLIN HFA) 108 (90 BASE) MCG/ACT inhaler Inhale 1-2 puffs into the lungs every 6 (six) hours as needed.  Marland Kitchen amLODipine (NORVASC) 10 MG tablet Take 10 mg by mouth daily.  Marland Kitchen aspirin EC 81 MG tablet Take 1 tablet (81 mg total) by mouth daily.  . budesonide-formoterol (SYMBICORT) 160-4.5 MCG/ACT inhaler Inhale 2 puffs into the lungs 2 (two) times daily.  . divalproex (DEPAKOTE ER) 500 MG 24 hr tablet Take 1,500 mg by mouth daily.   . fexofenadine (ALLEGRA) 180 MG tablet TAKE 1 TABLET BY MOUTH DAILY  . fluticasone (FLONASE) 50 MCG/ACT nasal spray USE 2 SPRAYS IN BOTH NOSTRILS DAILY  . Meloxicam (VIVLODEX) 10 MG CAPS Take 10 mg by mouth as needed.   . metFORMIN (GLUCOPHAGE-XR) 500 MG 24 hr tablet TAKE 1 TABLET BY MOUTH TWICE DAILY BEFORE A MEAL  . montelukast (SINGULAIR) 10 MG tablet Take 1 tablet (10 mg total) by mouth at bedtime.  . rosuvastatin (CRESTOR) 5 MG tablet TAKE ONE TABLET EVERY DAY  . clonazePAM (KLONOPIN) 0.5 MG tablet TAKE ONE TABLET BY MOUTH  TWICE DAILY AS NEEDED  . [DISCONTINUED] amLODipine (NORVASC) 10 MG tablet Take 1 tablet (10 mg total) by mouth daily.  . [DISCONTINUED] divalproex (DEPAKOTE ER) 250 MG 24 hr tablet Take 250 mg by mouth daily.   No facility-administered encounter medications on file as of 02/02/2017.     Review of Systems  Constitutional: Negative for appetite change and unexpected weight change.  HENT: Negative for congestion and sinus pressure.   Respiratory: Negative for cough, chest tightness and shortness of breath.   Cardiovascular: Negative for chest pain, palpitations and leg swelling.  Gastrointestinal: Negative for abdominal pain, diarrhea, nausea and vomiting.  Genitourinary: Negative for difficulty urinating and dysuria.  Musculoskeletal: Negative for back pain and joint swelling.  Skin: Negative for color change and rash.  Neurological: Negative  for dizziness, light-headedness and headaches.  Psychiatric/Behavioral: Negative for agitation and dysphoric mood.       Objective:    Physical Exam  Constitutional: He appears well-developed and well-nourished. No distress.  HENT:  Nose: Nose normal.  Mouth/Throat: Oropharynx is clear and moist.  Neck: Neck supple. No thyromegaly present.  Cardiovascular: Normal rate and regular rhythm.   Pulmonary/Chest: Effort normal and breath sounds normal. No respiratory distress.  Abdominal: Soft. Bowel sounds are normal. There is no tenderness.  Musculoskeletal: He exhibits no edema or tenderness.  Lymphadenopathy:    He has no cervical adenopathy.  Skin: No rash noted. No erythema.  Psychiatric: He has a normal mood and affect. His behavior is normal.    BP 124/76 (BP Location: Left Arm, Patient Position: Sitting, Cuff Size: Normal)   Pulse 72   Temp 98.2 F (36.8 C) (Oral)   Resp 12   Ht 5' 11"  (1.803 m)   Wt 260 lb 3.2 oz (118 kg)   SpO2 96%   BMI 36.29 kg/m  Wt Readings from Last 3 Encounters:  02/08/17 259 lb 8 oz (117.7 kg)  02/02/17 260 lb 3.2 oz (118 kg)  12/29/16 260 lb 4 oz (118 kg)     Lab Results  Component Value Date   WBC 7.3 12/22/2016   HGB 16.6 12/22/2016   HCT 48 12/22/2016   PLT 243 12/22/2016   GLUCOSE 198 (H) 10/26/2016   CHOL 209 (A) 12/22/2016   TRIG 105 12/22/2016   HDL 36 12/22/2016   LDLCALC 152 12/22/2016   ALT 40 12/22/2016   AST 25 12/22/2016   NA 136 10/26/2016   K 3.8 10/26/2016   CL 104 10/26/2016   CREATININE 1.21 10/26/2016   BUN 19 10/26/2016   CO2 24 10/26/2016   TSH 2.64 12/22/2016   HGBA1C 6.5 12/22/2016   MICROALBUR 138.8 12/22/2016    Nm Myocar Multi W/spect W/wall Motion / Ef  Result Date: 01/05/2017 Exercise myocardial perfusion imaging study with no significant  ischemia Normal wall motion, EF estimated at 56% No EKG changes concerning for ischemia at peak stress or in recovery. Target heart rate achieved Low risk  scan Signed, Esmond Plants, MD, Ph.D [facility]       Assessment & Plan:   Problem List Items Addressed This Visit    Chest pain    Seeing cardiology and undergoing w/up.  Has echo planned for tomorrow.  Continue risk factor modification.        Diabetes mellitus ([place])    Low carb diet and exercise.  Follow met b and a1c.  Sugars improved as outlined.  Continue current medication regimen.  Follow.  Essential hypertension    Blood pressure is better.  Continue same medication regimen.  Follow pressures.  Hold on making changes in medication.        Hypercholesterolemia    Low cholesterol diet and exercise.  crestor.  Follow lipid panel and liver function tests.       Stress    Doing better.  Seeing Dr Nicolasa Ducking.  On depakote and clonazepam.  Follow.            Einar Pheasant, MD

## 2017-02-03 ENCOUNTER — Other Ambulatory Visit: Payer: Self-pay

## 2017-02-03 ENCOUNTER — Ambulatory Visit (INDEPENDENT_AMBULATORY_CARE_PROVIDER_SITE_OTHER): Payer: BLUE CROSS/BLUE SHIELD

## 2017-02-03 DIAGNOSIS — R079 Chest pain, unspecified: Secondary | ICD-10-CM | POA: Diagnosis not present

## 2017-02-03 DIAGNOSIS — R0602 Shortness of breath: Secondary | ICD-10-CM | POA: Diagnosis not present

## 2017-02-08 ENCOUNTER — Encounter: Payer: Self-pay | Admitting: Cardiology

## 2017-02-08 ENCOUNTER — Ambulatory Visit (INDEPENDENT_AMBULATORY_CARE_PROVIDER_SITE_OTHER): Payer: BLUE CROSS/BLUE SHIELD | Admitting: Cardiology

## 2017-02-08 VITALS — BP 142/82 | HR 74 | Ht 71.0 in | Wt 259.5 lb

## 2017-02-08 DIAGNOSIS — Z8249 Family history of ischemic heart disease and other diseases of the circulatory system: Secondary | ICD-10-CM

## 2017-02-08 DIAGNOSIS — I10 Essential (primary) hypertension: Secondary | ICD-10-CM

## 2017-02-08 DIAGNOSIS — E784 Other hyperlipidemia: Secondary | ICD-10-CM | POA: Diagnosis not present

## 2017-02-08 DIAGNOSIS — E7849 Other hyperlipidemia: Secondary | ICD-10-CM

## 2017-02-08 DIAGNOSIS — Z6836 Body mass index (BMI) 36.0-36.9, adult: Secondary | ICD-10-CM | POA: Diagnosis not present

## 2017-02-08 DIAGNOSIS — E6609 Other obesity due to excess calories: Secondary | ICD-10-CM | POA: Diagnosis not present

## 2017-02-08 NOTE — Progress Notes (Signed)
Cardiology Office Note   Date:  02/08/2017   ID:  Francisco Jackson, DOB 12/13/1975, MRN [id number]  Referring Doctor:  Einar Pheasant, MD   Cardiologist:   Wende Bushy, MD   Reason for consultation:  Chief Complaint  Patient presents with  . other    F/u testing. Patient denies chest pain and SOB. Meds reviewed verbally with pt.      History of Present Illness: Francisco Jackson is a 41 y.o. male who presents forFollow-up after testing   Since last visit, patient denies any recurrence of chest pain. No shortness of breath. He continues to be physically active without any symptoms.  In terms of hypertension, he thinks his blood pressure is better controlled now in the 097 systolic.  Patient denies PND, orthopnea, edema.   ROS:  Please see the history of present illness. Aside from mentioned under HPI, all other systems are reviewed and negative.    Past Medical History:  Diagnosis Date  . Asthma   . Diabetes mellitus without complication ([place])   . Personality disorder    borderline    Past Surgical History:  Procedure Laterality Date  . ANTERIOR CRUCIATE LIGAMENT REPAIR Right 2006  . [place] SURGERY  2010  . MENISCUS REPAIR Left 1996     reports that he quit smoking about 14 years ago. His smoking use included Cigarettes. He has a 5.00 pack-year smoking history. He quit smokeless tobacco use about 7 years ago. His smokeless tobacco use included Snuff. He reports that he drinks about 1.2 oz of alcohol per week . He reports that he does not use drugs.   family history includes Diabetes in his maternal grandfather, maternal grandmother, mother, paternal aunt, paternal uncle, and sister; Heart attack in his father, maternal grandfather, and paternal aunt; Heart disease in his maternal grandfather; Hypertension in his father and sister; Melanoma in his mother.  Father died in 9s MI Mat uncle MI 87s  Outpatient Medications Prior to Visit  Medication Sig  Dispense Refill  . albuterol (PROVENTIL HFA;VENTOLIN HFA) 108 (90 BASE) MCG/ACT inhaler Inhale 1-2 puffs into the lungs every 6 (six) hours as needed. 18 g 6  . amLODipine (NORVASC) 10 MG tablet Take 10 mg by mouth daily.    Marland Kitchen aspirin EC 81 MG tablet Take 1 tablet (81 mg total) by mouth daily. 90 tablet 3  . budesonide-formoterol (SYMBICORT) 160-4.5 MCG/ACT inhaler Inhale 2 puffs into the lungs 2 (two) times daily. 1 Inhaler 1  . clonazePAM (KLONOPIN) 0.5 MG tablet TAKE ONE TABLET BY MOUTH TWICE DAILY AS NEEDED 60 tablet 0  . divalproex (DEPAKOTE ER) 500 MG 24 hr tablet Take 1,500 mg by mouth daily.   0  . fexofenadine (ALLEGRA) 180 MG tablet TAKE 1 TABLET BY MOUTH DAILY 90 tablet 3  . fluticasone (FLONASE) 50 MCG/ACT nasal spray USE 2 SPRAYS IN BOTH NOSTRILS DAILY 16 g 3  . Meloxicam (VIVLODEX) 10 MG CAPS Take 10 mg by mouth as needed.     . metFORMIN (GLUCOPHAGE-XR) 500 MG 24 hr tablet TAKE 1 TABLET BY MOUTH TWICE DAILY BEFORE A MEAL 60 tablet 5  . montelukast (SINGULAIR) 10 MG tablet Take 1 tablet (10 mg total) by mouth at bedtime. 30 tablet 3  . rosuvastatin (CRESTOR) 5 MG tablet TAKE ONE TABLET EVERY DAY 30 tablet 0   No facility-administered medications prior to visit.      Allergies: Losartan    PHYSICAL EXAM: VS:  BP (!) 142/82 (  BP Location: Left Arm, Patient Position: Sitting, Cuff Size: Normal)   Pulse 74   Ht 5\' 11"  (1.803 m)   Wt 259 lb 8 oz (117.7 kg)   BMI 36.19 kg/m  , Body mass index is 36.19 kg/m. Wt Readings from Last 3 Encounters:  02/08/17 259 lb 8 oz (117.7 kg)  02/02/17 260 lb 3.2 oz (118 kg)  12/29/16 260 lb 4 oz (118 kg)    GENERAL:  well developed, well nourished,obese, not in acute distress HEENT: normocephalic, pink conjunctivae, anicteric sclerae, no xanthelasma, normal dentition, oropharynx clear NECK:  no neck vein engorgement, JVP normal, no hepatojugular reflux, carotid upstroke brisk and symmetric, no bruit, no thyromegaly, no  lymphadenopathy LUNGS:  good respiratory effort, clear to auscultation bilaterally CV:  PMI not displaced, no thrills, no lifts, S1 and S2 within normal limits, no palpable S3 or S4, no murmurs, no rubs, no gallops ABD:  Soft, nontender, nondistended, normoactive bowel sounds, no abdominal aortic bruit, no hepatomegaly, no splenomegaly MS: nontender back, no kyphosis, no scoliosis, no joint deformities EXT:  2+ DP/PT pulses, no edema, no varicosities, no cyanosis, no clubbing SKIN: warm, nondiaphoretic, normal turgor, no ulcers NEUROPSYCH: alert, oriented to person, place, and time, sensory/motor grossly intact, normal mood, appropriate affect  Recent Labs: 10/26/2016: BUN 19; Creatinine, Ser 1.21; Potassium 3.8; Sodium 136 12/22/2016: ALT 40; Hemoglobin 16.6; Platelets 243; TSH 2.64   Lipid Panel    Component Value Date/Time   CHOL 209 (A) 12/22/2016   TRIG 105 12/22/2016   HDL 36 12/22/2016   [place] 152 12/22/2016     Other studies Reviewed:  EKG:  The ekg from 12/16/2016 was personally reviewed by me and it revealed sinus rhythm 70 BPM. Left axis deviation. Abnormal EKG.  EKG from 12/29/2016 was personally reviewed by me and it revealed sinus rhythm, 82 BPM. Left axis deviation. Possible LVH.  Additional studies/ records that were reviewed personally reviewed by me today include:  Echo 02/03/2017: - Left ventricle: The cavity size was normal. Wall thickness was   normal. Systolic function was normal. The estimated ejection   fraction was in the range of 55% to 60%. Wall motion was normal;   there were no regional wall motion abnormalities. Left   ventricular diastolic function parameters were normal.  Nuclear stress is 12/28/2016: Exercise myocardial perfusion imaging study with no significant  ischemia Normal wall motion, EF estimated at 56% No EKG changes concerning for ischemia at peak stress or in recovery. Target heart rate achieved Low risk scan  ASSESSMENT AND  PLAN: Chest pain, palpitations, shortness breath Risk factors for CAD include diabetes, hypertension, hyperlipidemia, family history of premature CAD No recurrence of symptoms No evidence of ischemia on stress testing LVEF normal Findings discussed at length with patient and wife. Continue risk factor modification. Discussed importance of controlling hypertension, hyperlipidemia as well as weight control. Also optimal management of diabetes is important. Patient verbalized understanding. They are interested in further risk stratification with calcium scoring. We will place the order  Hypertension BP is well controlled. Continue monitoring BP. Continue current medical therapy and lifestyle changes. Being followed by PCP. Continue medical therapy as well as dietary and lifestyle changes.  Hyperlipidemia LDL goal is less than 70 due to diabetes. PCP following labs. Continue with statin therapy.  Obesity Body mass index is 36.19 kg/m. Recommend aggressive weight loss through diet and increased physical activity.   Current medicines are reviewed at length with the patient today.  The patient does not  have concerns regarding medicines.  Labs/ tests ordered today include:  Orders Placed This Encounter  Procedures  . CT CARDIAC SCORING    I had a lengthy and detailed discussion with the patient regarding diagnoses, prognosis, diagnostic options, treatment options , and side effects of medications.   I counseled the patient on importance of lifestyle modification including heart healthy diet, regular physical activity.   Disposition:   FU with Cardiology prn  I spent at least 25 minutes with the patient today and more than 50% of the time was spent counseling the patient and coordinating care.      Signed, Wende Bushy, MD  02/08/2017 11:57 AM    [place]  This note was generated in part with voice recognition software and I apologize for any  typographical errors that were not detected and corrected.

## 2017-02-08 NOTE — Patient Instructions (Addendum)
Testing/Procedures: CT Calcium scoring scheduled for 02/10/17 at 09:45AM. Please arrive at 09:30AM to register. No prep is needed for this test. We will call you with results.   Follow-Up: Your physician recommends that you schedule a follow-up appointment as needed.   It was a pleasure seeing you today here in the office. Please do not hesitate to give [place] a call back if you have any further questions. [place], BSN

## 2017-02-10 ENCOUNTER — Ambulatory Visit (INDEPENDENT_AMBULATORY_CARE_PROVIDER_SITE_OTHER)
Admission: RE | Admit: 2017-02-10 | Discharge: 2017-02-10 | Disposition: A | Discharge status: Home / Self Care | Payer: Self-pay | Source: Ambulatory Visit | Attending: Cardiology | Admitting: Cardiology

## 2017-02-10 DIAGNOSIS — E7849 Other hyperlipidemia: Secondary | ICD-10-CM

## 2017-02-10 DIAGNOSIS — E784 Other hyperlipidemia: Secondary | ICD-10-CM

## 2017-02-12 ENCOUNTER — Encounter: Payer: Self-pay | Admitting: Internal Medicine

## 2017-02-12 NOTE — Assessment & Plan Note (Signed)
Low cholesterol diet and exercise.  crestor.  Follow lipid panel and liver function tests.

## 2017-02-12 NOTE — Assessment & Plan Note (Signed)
Low carb diet and exercise.  Follow met b and a1c.  Sugars improved as outlined.  Continue current medication regimen.  Follow.

## 2017-02-12 NOTE — Assessment & Plan Note (Signed)
Doing better.  Seeing Dr Nicolasa Ducking.  On depakote and clonazepam.  Follow.

## 2017-02-12 NOTE — Assessment & Plan Note (Signed)
Blood pressure is better.  Continue same medication regimen.  Follow pressures.  Hold on making changes in medication.

## 2017-02-12 NOTE — Assessment & Plan Note (Signed)
Seeing cardiology and undergoing w/up.  Has echo planned for tomorrow.  Continue risk factor modification.

## 2017-02-15 ENCOUNTER — Telehealth: Payer: Self-pay | Admitting: Cardiology

## 2017-02-15 NOTE — Telephone Encounter (Signed)
Spoke with patients wife per release form and reviewed results and recommendations. She states that her husband is currently in [place] and connection was not good. She expressed great concern given that his father died in his 42's after having normal stress test. Reviewed his results in detail along with stress results and she verbalized understanding. Instructed her to make sure if he develops any symptoms to seek medical attention immediately. She was appreciative for the call back and review with her.

## 2017-02-15 NOTE — Telephone Encounter (Signed)
Pt wife calling asking if we can give her the CT results  When we call patient he was out of town and could not really understand  He asked if we could call her back Please call

## 2017-03-04 ENCOUNTER — Other Ambulatory Visit: Payer: Self-pay | Admitting: Internal Medicine

## 2017-04-28 DIAGNOSIS — L820 Inflamed seborrheic keratosis: Secondary | ICD-10-CM | POA: Diagnosis not present

## 2017-04-28 DIAGNOSIS — D485 Neoplasm of uncertain behavior of skin: Secondary | ICD-10-CM | POA: Diagnosis not present

## 2017-04-28 DIAGNOSIS — L821 Other seborrheic keratosis: Secondary | ICD-10-CM | POA: Diagnosis not present

## 2017-04-28 DIAGNOSIS — D229 Melanocytic nevi, unspecified: Secondary | ICD-10-CM | POA: Diagnosis not present

## 2017-04-28 DIAGNOSIS — L578 Other skin changes due to chronic exposure to nonionizing radiation: Secondary | ICD-10-CM | POA: Diagnosis not present

## 2017-05-05 ENCOUNTER — Encounter: Payer: Self-pay | Admitting: Internal Medicine

## 2017-05-05 ENCOUNTER — Ambulatory Visit (INDEPENDENT_AMBULATORY_CARE_PROVIDER_SITE_OTHER): Payer: BLUE CROSS/BLUE SHIELD | Admitting: Internal Medicine

## 2017-05-05 DIAGNOSIS — I10 Essential (primary) hypertension: Secondary | ICD-10-CM | POA: Diagnosis not present

## 2017-05-05 DIAGNOSIS — E119 Type 2 diabetes mellitus without complications: Secondary | ICD-10-CM | POA: Diagnosis not present

## 2017-05-05 DIAGNOSIS — E7800 Pure hypercholesterolemia, unspecified: Secondary | ICD-10-CM | POA: Diagnosis not present

## 2017-05-05 DIAGNOSIS — F439 Reaction to severe stress, unspecified: Secondary | ICD-10-CM

## 2017-05-05 MED ORDER — SERTRALINE HCL 50 MG PO TABS: ORAL_TABLET | ORAL | 2 refills | Status: DC

## 2017-05-05 NOTE — Progress Notes (Signed)
Patient ID: Francisco Jackson, male   DOB: 1976/04/16, 41 y.o.   MRN: [id number]   Subjective:    Patient ID: Francisco Jackson, male    DOB: 1976-06-08, 41 y.o.   MRN: [id number]  HPI  Patient here for a scheduled follow up.  He has lost some weight.  States his am sugars have been averaging 118-160 and pm sugars 120s.  Discussed diet and exercise.  No chest pain.  Had cardiac w/up recently.  Reviewed.  Had calcium scan.  Reviewed.  Discussed risk factor modification.  No acid reflux.  No abdominal pain or cramping.  Bowels stable.  Discussed increased stress.  He was seeing Dr Nicolasa Ducking.  Was on depakote.  Made him feel worse.  He was sluggish.  Off medication now.  Takes clonazepam prn.  Increased family and business stress.  He feels he needs to be on something to help level things out.     Past Medical History:  Diagnosis Date  . Asthma   . Diabetes mellitus without complication ([place])   . Personality disorder    borderline   Past Surgical History:  Procedure Laterality Date  . ANTERIOR CRUCIATE LIGAMENT REPAIR Right 2006  . [place] SURGERY  2010  . MENISCUS REPAIR Left 1996   Family History  Problem Relation Age of Onset  . Diabetes Mother   . Melanoma Mother   . Diabetes Paternal Aunt   . Heart attack Paternal Aunt   . Diabetes Paternal Uncle   . Diabetes Maternal Grandmother   . Diabetes Maternal Grandfather   . Heart disease Maternal Grandfather   . Heart attack Maternal Grandfather   . Heart attack Father   . Hypertension Father   . Diabetes Sister   . Hypertension Sister    Social History   Social History  . Marital status: Married    Spouse name: N/A  . Number of children: N/A  . Years of education: N/A   Social History Main Topics  . Smoking status: Former Smoker    Packs/day: 0.50    Years: 10.00    Types: Cigarettes    Quit date: 10/11/2002  . Smokeless tobacco: Former Systems developer    Types: Snuff    Quit date: 10/11/2009  . Alcohol use 1.2 oz/week    2  Glasses of wine per week     Comment: ocassional  . Drug use: No  . Sexual activity: Not Asked   Other Topics Concern  . None   Social History Narrative  . None    Outpatient Encounter Prescriptions as of 05/05/2017  Medication Sig  . albuterol (PROVENTIL HFA;VENTOLIN HFA) 108 (90 BASE) MCG/ACT inhaler Inhale 1-2 puffs into the lungs every 6 (six) hours as needed.  Marland Kitchen amLODipine (NORVASC) 10 MG tablet Take 10 mg by mouth daily.  Marland Kitchen aspirin EC 81 MG tablet Take 1 tablet (81 mg total) by mouth daily.  . budesonide-formoterol (SYMBICORT) 160-4.5 MCG/ACT inhaler Inhale 2 puffs into the lungs 2 (two) times daily.  . clonazePAM (KLONOPIN) 0.5 MG tablet TAKE ONE TABLET BY MOUTH TWICE DAILY AS NEEDED  . fexofenadine (ALLEGRA) 180 MG tablet TAKE 1 TABLET BY MOUTH DAILY  . fluticasone (FLONASE) 50 MCG/ACT nasal spray USE 2 SPRAYS IN BOTH NOSTRILS DAILY  . Meloxicam (VIVLODEX) 10 MG CAPS Take 10 mg by mouth as needed.   . metFORMIN (GLUCOPHAGE-XR) 500 MG 24 hr tablet TAKE 1 TABLET BY MOUTH TWICE DAILY BEFORE A MEAL  . montelukast (SINGULAIR) 10 MG  tablet Take 1 tablet (10 mg total) by mouth at bedtime.  . rosuvastatin (CRESTOR) 5 MG tablet TAKE ONE TABLET EVERY DAY  . [DISCONTINUED] divalproex (DEPAKOTE ER) 500 MG 24 hr tablet Take 1,500 mg by mouth daily.   . sertraline (ZOLOFT) 50 MG tablet Take 1/2 tablet q day for one week and then one per day.   No facility-administered encounter medications on file as of 05/05/2017.     Review of Systems  Constitutional: Negative for appetite change and unexpected weight change.  HENT: Negative for congestion and sinus pressure.   Respiratory: Negative for cough, chest tightness and shortness of breath.   Cardiovascular: Negative for chest pain, palpitations and leg swelling.  Gastrointestinal: Negative for abdominal pain, diarrhea, nausea and vomiting.  Genitourinary: Negative for difficulty urinating and dysuria.  Musculoskeletal: Negative for back  pain and joint swelling.  Skin: Negative for color change and rash.  Neurological: Negative for dizziness, light-headedness and headaches.  Psychiatric/Behavioral: Negative for agitation.       Increased stress as outlined.         Objective:     Blood pressure rechecked by me:  124/82  Physical Exam  Constitutional: He appears well-developed and well-nourished. No distress.  HENT:  Nose: Nose normal.  Mouth/Throat: Oropharynx is clear and moist.  Neck: Neck supple. No thyromegaly present.  Cardiovascular: Normal rate and regular rhythm.   Pulmonary/Chest: Effort normal and breath sounds normal. No respiratory distress.  Abdominal: Soft. Bowel sounds are normal. There is no tenderness.  Musculoskeletal: He exhibits no edema or tenderness.  DP pulse palpable and equal bilaterally.  Intact sensation to pinprick and light touch.  No lesions.   Lymphadenopathy:    He has no cervical adenopathy.  Skin: No rash noted. No erythema.  Psychiatric: He has a normal mood and affect. His behavior is normal.    BP 124/82   Pulse 68   Temp 98.6 F (37 C) (Oral)   Resp 12   Ht _0  (1.803 m)   Wt 253 lb (114.8 kg)   SpO2 96%   BMI 35.29 kg/m  Wt Readings from Last 3 Encounters:  05/05/17 253 lb (114.8 kg)  02/08/17 259 lb 8 oz (117.7 kg)  02/02/17 260 lb 3.2 oz (118 kg)     Lab Results  Component Value Date   WBC 7.3 12/22/2016   HGB 16.6 12/22/2016   HCT 48 12/22/2016   PLT 243 12/22/2016   GLUCOSE 198 (H) 10/26/2016   CHOL 209 (A) 12/22/2016   TRIG 105 12/22/2016   HDL 36 12/22/2016   LDLCALC 152 12/22/2016   ALT 40 12/22/2016   AST 25 12/22/2016   NA 136 10/26/2016   K 3.8 10/26/2016   CL 104 10/26/2016   CREATININE 1.21 10/26/2016   BUN 19 10/26/2016   CO2 24 10/26/2016   TSH 2.64 12/22/2016   HGBA1C 6.5 12/22/2016   MICROALBUR 138.8 12/22/2016    Ct Cardiac Scoring  Addendum Date: 02/10/2017   ADDENDUM REPORT: 02/10/2017 12:14 CLINICAL DATA:  Risk  stratification EXAM: Coronary Calcium Score TECHNIQUE: The patient was scanned on a Siemens Somatom 64 slice scanner. Axial non-contrast 3 mm slices were carried out through the heart. The data set was analyzed on a dedicated work station and scored using the [place]. FINDINGS: Non-cardiac: See separate report from [facility]. Ascending Aorta:  3.3 cm Pericardium: Normal Coronary arteries: Small foci of calcium seen in proximal LAD/ostial D1 area IMPRESSION: Coronary calcium score of  1.5 . This was 50th percentile for age and sex matched control. [place] Rouge Electronically Signed   By: Jenkins Rouge M.D.   On: 02/10/2017 12:14   Result Date: 02/10/2017 EXAM: OVER-READ INTERPRETATION  CT CHEST The following report is an over-read performed by radiologist Dr. Alvino Blood [facility], [place] on 02/10/2017. This over-read does not include interpretation of cardiac or coronary anatomy or pathology. The coronary calcium score interpretation by the cardiologist is attached. COMPARISON:  None. FINDINGS: Limited view of the lung parenchyma demonstrates no suspicious nodularity. Airways are normal. Limited view of the mediastinum demonstrates no adenopathy. Esophagus normal. Limited view of the upper abdomen unremarkable. Limited view of the skeleton and chest wall is unremarkable. IMPRESSION: No significant extracardiac findings. Electronically Signed: By: Suzy Bouchard M.D. On: 02/10/2017 12:03       Assessment & Plan:   Problem List Items Addressed This Visit    Diabetes mellitus ([place])    Sugars as outlined.  On metformin.  Low carb diet and exercise.  Follow met b and a1c.  Needs regular eye exams.       Essential hypertension    Blood pressure under good control.  Continue same medication regimen.  Follow pressures.  Follow metabolic panel.        Hypercholesterolemia    On crestor.  Low cholesterol diet and exercise.  Follow lipid panel and liver function tests.         Stress    Increased stress as outlined.  Discussed at length with him today.  Desires not to return to psychiatry.  Declines counseling.  Has previously tried.  Will start zoloft.  Follow.  Get him back in soon to reassess.            Einar Pheasant, MD

## 2017-05-05 NOTE — Progress Notes (Signed)
Pre-visit discussion using our clinic review tool. No additional management support is needed unless otherwise documented below in the visit note.  

## 2017-05-07 ENCOUNTER — Encounter: Payer: Self-pay | Admitting: Internal Medicine

## 2017-05-07 NOTE — Assessment & Plan Note (Signed)
Sugars as outlined.  On metformin.  Low carb diet and exercise.  Follow met b and a1c.  Needs regular eye exams.  

## 2017-05-07 NOTE — Assessment & Plan Note (Signed)
Blood pressure under good control.  Continue same medication regimen.  Follow pressures.  Follow metabolic panel.   

## 2017-05-07 NOTE — Assessment & Plan Note (Signed)
On crestor.  Low cholesterol diet and exercise.  Follow lipid panel and liver function tests.   

## 2017-05-07 NOTE — Assessment & Plan Note (Signed)
Increased stress as outlined.  Discussed at length with him today.  Desires not to return to psychiatry.  Declines counseling.  Has previously tried.  Will start zoloft.  Follow.  Get him back in soon to reassess.

## 2017-06-07 ENCOUNTER — Other Ambulatory Visit: Payer: Self-pay | Admitting: Internal Medicine

## 2017-06-08 ENCOUNTER — Other Ambulatory Visit: Payer: Self-pay | Admitting: Internal Medicine

## 2017-06-20 ENCOUNTER — Ambulatory Visit (INDEPENDENT_AMBULATORY_CARE_PROVIDER_SITE_OTHER): Payer: BLUE CROSS/BLUE SHIELD | Admitting: Internal Medicine

## 2017-06-20 ENCOUNTER — Encounter: Payer: Self-pay | Admitting: Internal Medicine

## 2017-06-20 DIAGNOSIS — F439 Reaction to severe stress, unspecified: Secondary | ICD-10-CM

## 2017-06-20 DIAGNOSIS — E7800 Pure hypercholesterolemia, unspecified: Secondary | ICD-10-CM | POA: Diagnosis not present

## 2017-06-20 DIAGNOSIS — Z23 Encounter for immunization: Secondary | ICD-10-CM

## 2017-06-20 DIAGNOSIS — N529 Male erectile dysfunction, unspecified: Secondary | ICD-10-CM

## 2017-06-20 DIAGNOSIS — I10 Essential (primary) hypertension: Secondary | ICD-10-CM | POA: Diagnosis not present

## 2017-06-20 DIAGNOSIS — E119 Type 2 diabetes mellitus without complications: Secondary | ICD-10-CM

## 2017-06-20 MED ORDER — SERTRALINE HCL 50 MG PO TABS
ORAL_TABLET | ORAL | 2 refills | Status: DC
Start: 2017-06-20 — End: 2017-08-15

## 2017-06-20 MED ORDER — HYDROCHLOROTHIAZIDE 12.5 MG PO CAPS: 12.5000 mg | ORAL_CAPSULE | Freq: Every day | ORAL | 2 refills | Status: DC

## 2017-06-20 NOTE — Assessment & Plan Note (Signed)
Discussed with him today.  Discussed history of diabetes and hypertension.  Will refer to urology for further evaluation, work up and to discuss treatment options.

## 2017-06-20 NOTE — Progress Notes (Signed)
Patient ID: KOFI MURRELL, male   DOB: 02-15-1976, 41 y.o.   MRN: [id number]   Subjective:    Patient ID: ACY ORSAK, male    DOB: 12-19-1975, 41 y.o.   MRN: [id number]  HPI  Patient here for a scheduled follow up.  He reports he feels better.  Taking zoloft.  Has helped to level things out.  Feels needs to increase the dose, but does feel better.  He aggravated his left anterior chest and neck - splitting wood.  Hurts in his chest - to turn his head.  Some increased discomfort with palpation.  Is better.  Saw a Restaurant manager, fast food.  No sob.  No acid reflux.  No abdominal pain or cramping.  Bowels stable.  Is having problems with obtaining and sustaining an erection.  Some decreased libido. Will continue zoloft.  Has diabetes and hypertension.  On medication.  Will have urology evaluate with question of need for any further w/up, evaluation and to discuss treatment options.  States blood sugars averaging:  130 in the am and states is lower in the pm.     Past Medical History:  Diagnosis Date  . Asthma   . Diabetes mellitus without complication ([place])   . Personality disorder    borderline   Past Surgical History:  Procedure Laterality Date  . ANTERIOR CRUCIATE LIGAMENT REPAIR Right 2006  . [place] SURGERY  2010  . MENISCUS REPAIR Left 1996   Family History  Problem Relation Age of Onset  . Diabetes Mother   . Melanoma Mother   . Diabetes Paternal Aunt   . Heart attack Paternal Aunt   . Diabetes Paternal Uncle   . Diabetes Maternal Grandmother   . Diabetes Maternal Grandfather   . Heart disease Maternal Grandfather   . Heart attack Maternal Grandfather   . Heart attack Father   . Hypertension Father   . Diabetes Sister   . Hypertension Sister    Social History   Social History  . Marital status: Married    Spouse name: N/A  . Number of children: N/A  . Years of education: N/A   Social History Main Topics  . Smoking status: Former Smoker    Packs/day: 0.50   Years: 10.00    Types: Cigarettes    Quit date: 10/11/2002  . Smokeless tobacco: Former Systems developer    Types: Snuff    Quit date: 10/11/2009  . Alcohol use 1.2 oz/week    2 Glasses of wine per week     Comment: ocassional  . Drug use: No  . Sexual activity: Not Asked   Other Topics Concern  . None   Social History Narrative  . None    Outpatient Encounter Prescriptions as of 06/20/2017  Medication Sig  . albuterol (PROVENTIL HFA;VENTOLIN HFA) 108 (90 BASE) MCG/ACT inhaler Inhale 1-2 puffs into the lungs every 6 (six) hours as needed.  Marland Kitchen amLODipine (NORVASC) 10 MG tablet TAKE ONE TABLET BY MOUTH EVERY DAY  . aspirin EC 81 MG tablet Take 1 tablet (81 mg total) by mouth daily.  . budesonide-formoterol (SYMBICORT) 160-4.5 MCG/ACT inhaler Inhale 2 puffs into the lungs 2 (two) times daily.  . clonazePAM (KLONOPIN) 0.5 MG tablet TAKE ONE TABLET BY MOUTH TWICE DAILY AS NEEDED  . fexofenadine (ALLEGRA) 180 MG tablet TAKE 1 TABLET BY MOUTH DAILY  . fluticasone (FLONASE) 50 MCG/ACT nasal spray USE 2 SPRAYS IN BOTH NOSTRILS DAILY  . Meloxicam (VIVLODEX) 10 MG CAPS Take 10 mg by mouth  as needed.   . metFORMIN (GLUCOPHAGE-XR) 500 MG 24 hr tablet TAKE ONE TABLET TWICE DAILY BEFORE A MEAL  . montelukast (SINGULAIR) 10 MG tablet TAKE ONE TABLET BY MOUTH EVERY MORNING  . rosuvastatin (CRESTOR) 5 MG tablet TAKE 1 TABLET BY MOUTH DAILY  . sertraline (ZOLOFT) 50 MG tablet Take 1 1/2 tablet q day  . [DISCONTINUED] sertraline (ZOLOFT) 50 MG tablet Take 1/2 tablet q day for one week and then one per day.  . hydrochlorothiazide (MICROZIDE) 12.5 MG capsule Take 1 capsule (12.5 mg total) by mouth daily.   No facility-administered encounter medications on file as of 06/20/2017.     Review of Systems  Constitutional: Negative for appetite change and unexpected weight change.  HENT: Negative for congestion and sinus pressure.   Respiratory: Negative for cough, chest tightness and shortness of breath.     Cardiovascular: Negative for palpitations and leg swelling.       Pain - left anterior chest with palpation.    Gastrointestinal: Negative for abdominal pain, diarrhea, nausea and vomiting.  Genitourinary: Negative for difficulty urinating and dysuria.  Musculoskeletal: Negative for back pain and joint swelling.  Skin: Negative for color change and rash.  Neurological: Negative for dizziness, light-headedness and headaches.  Psychiatric/Behavioral: Negative for agitation and dysphoric mood.       Feels better since being on the zoloft.        Objective:    Physical Exam  Constitutional: He appears well-developed and well-nourished. No distress.  HENT:  Nose: Nose normal.  Mouth/Throat: Oropharynx is clear and moist.  Neck: Neck supple. No thyromegaly present.  Cardiovascular: Normal rate and regular rhythm.   Pulmonary/Chest: Effort normal and breath sounds normal. No respiratory distress.  Abdominal: Soft. Bowel sounds are normal. There is no tenderness.  Musculoskeletal: He exhibits no edema or tenderness.  Lymphadenopathy:    He has no cervical adenopathy.  Skin: No rash noted. No erythema.  Psychiatric: He has a normal mood and affect. His behavior is normal.    BP 140/88 (BP Location: Left Arm, Patient Position: Sitting, Cuff Size: Normal)   Pulse 72   Temp 97.6 F (36.4 C) (Oral)   Resp 14   Ht _0  (1.803 m)   Wt 248 lb 12.8 oz (112.9 kg)   SpO2 98%   BMI 34.70 kg/m  Wt Readings from Last 3 Encounters:  06/20/17 248 lb 12.8 oz (112.9 kg)  05/05/17 253 lb (114.8 kg)  02/08/17 259 lb 8 oz (117.7 kg)     Lab Results  Component Value Date   WBC 7.3 12/22/2016   HGB 16.6 12/22/2016   HCT 48 12/22/2016   PLT 243 12/22/2016   GLUCOSE 198 (H) 10/26/2016   CHOL 209 (A) 12/22/2016   TRIG 105 12/22/2016   HDL 36 12/22/2016   LDLCALC 152 12/22/2016   ALT 40 12/22/2016   AST 25 12/22/2016   NA 136 10/26/2016   K 3.8 10/26/2016   CL 104 10/26/2016    CREATININE 1.21 10/26/2016   BUN 19 10/26/2016   CO2 24 10/26/2016   TSH 2.64 12/22/2016   HGBA1C 6.5 12/22/2016   MICROALBUR 138.8 12/22/2016    Ct Cardiac Scoring  Addendum Date: 02/10/2017   ADDENDUM REPORT: 02/10/2017 12:14 CLINICAL DATA:  Risk stratification EXAM: Coronary Calcium Score TECHNIQUE: The patient was scanned on a Siemens Somatom 64 slice scanner. Axial non-contrast 3 mm slices were carried out through the heart. The data set was analyzed on a dedicated work  station and scored using the Agatson method. FINDINGS: Non-cardiac: See separate report from [facility]. Ascending Aorta:  3.3 cm Pericardium: Normal Coronary arteries: Small foci of calcium seen in proximal LAD/ostial D1 area IMPRESSION: Coronary calcium score of 1.5 . This was 50th percentile for age and sex matched control. Jenkins Rouge Electronically Signed   By: Jenkins Rouge M.D.   On: 02/10/2017 12:14   Result Date: 02/10/2017 EXAM: OVER-READ INTERPRETATION  CT CHEST The following report is an over-read performed by radiologist Dr. Alvino Blood [facility], [place] on 02/10/2017. This over-read does not include interpretation of cardiac or coronary anatomy or pathology. The coronary calcium score interpretation by the cardiologist is attached. COMPARISON:  None. FINDINGS: Limited view of the lung parenchyma demonstrates no suspicious nodularity. Airways are normal. Limited view of the mediastinum demonstrates no adenopathy. Esophagus normal. Limited view of the upper abdomen unremarkable. Limited view of the skeleton and chest wall is unremarkable. IMPRESSION: No significant extracardiac findings. Electronically Signed: By: Suzy Bouchard M.D. On: 02/10/2017 12:03       Assessment & Plan:   Problem List Items Addressed This Visit    Diabetes mellitus ([place])    Sugars improved.  Continue metformin.  Discussed low carb diet and exercise.  Follow met b and a1c.        Erectile dysfunction    Discussed  with him today.  Discussed history of diabetes and hypertension.  Will refer to urology for further evaluation, work up and to discuss treatment options.        Relevant Orders   Ambulatory referral to Urology   Essential hypertension    Blood pressure remains elevated.  Add hctz 12.34m q day.  Follow pressures.  Follow metabolic panel.  Has taken previously and tolerated.        Relevant Medications   hydrochlorothiazide (MICROZIDE) 12.5 MG capsule   Hypercholesterolemia    On crestor.  Low cholesterol diet and exercise.  Follow lipid panel and liver function tests.        Relevant Medications   hydrochlorothiazide (MICROZIDE) 12.5 MG capsule   Stress    Increased stress as outlined.  On zoloft.  Feels better.  Feels needs to increase the dose.  Will increase to 719mq day.  Follow.         Other Visit Diagnoses    Need for immunization against influenza       Relevant Orders   Flu Vaccine QUAD 36+ mos IM (Completed)       SCEinar PheasantMD

## 2017-06-20 NOTE — Assessment & Plan Note (Signed)
Increased stress as outlined.  On zoloft.  Feels better.  Feels needs to increase the dose.  Will increase to 75mg  q day.  Follow.

## 2017-06-20 NOTE — Assessment & Plan Note (Signed)
Blood pressure remains elevated.  Add hctz 12.5mg  q day.  Follow pressures.  Follow metabolic panel.  Has taken previously and tolerated.

## 2017-06-20 NOTE — Assessment & Plan Note (Signed)
Sugars improved.  Continue metformin.  Discussed low carb diet and exercise.  Follow met b and a1c.

## 2017-06-20 NOTE — Assessment & Plan Note (Signed)
On crestor.  Low cholesterol diet and exercise.  Follow lipid panel and liver function tests.   

## 2017-07-01 NOTE — Progress Notes (Signed)
07/04/2017 3:36 PM   Celene Squibb 1976-10-05 [id number]  Referring provider: Einar Pheasant, [address] [place], [place] [address]  Chief Complaint  Patient presents with  . New Patient (Initial Visit)    HPI: Patient is a 41 year old Caucasian male who is referred to [place] by Dr. Nicki Reaper for erectile dysfunction  Erectile dysfunction His SHIM score is 7, which is severe ED.   He has been having difficulty with erections for several months.   His major complaint is lack of sex drive.  His libido is diminished.   His risk factors for ED are age, BPH, DM, HTN, HLD, sleep apnea and stress.  He denies any painful erections or curvatures with his erections.   He is no longer having spontaneous erections.  He has not tried anything in the past.   He was diagnosed with sleep apnea over ten years ago.  He does not sleep with a CPAP machine.       SHIM    Row Name 07/04/17 1502         SHIM: Over the last 6 months:   How do you rate your confidence that you could get and keep an erection? Very Low     When you had erections with sexual stimulation, how often were your erections hard enough for penetration (entering your partner)? A Few Times (much less than half the time)     During sexual intercourse, how often were you able to maintain your erection after you had penetrated (entered) your partner? A Few Times (much less than half the time)     During sexual intercourse, how difficult was it to maintain your erection to completion of intercourse? Extremely Difficult     When you attempted sexual intercourse, how often was it satisfactory for you? Almost Never or Never       SHIM Total Score   SHIM 7        Score: 1-7 Severe ED 8-11 Moderate ED 12-16 Mild-Moderate ED 17-21 Mild ED 22-25 No ED    BPH WITH LUTS  (prostate and/or bladder) His IPSS score today is 8, which is moderate lower urinary tract symptomatology. He is mostly satisfied with his  quality life due to his urinary symptoms.  His major complaint today nocturia x 1.  He has had these symptoms for several years.  He denies any dysuria, hematuria or suprapubic pain.  He also denies any recent fevers, chills, nausea or vomiting.  He does not have a family history of PCa.     IPSS    Row Name 07/04/17 1500         International Prostate Symptom Score   How often have you had the sensation of not emptying your bladder? Not at All     How often have you had to urinate less than every two hours? More than half the time     How often have you found you stopped and started again several times when you urinated? Not at All     How often have you found it difficult to postpone urination? Not at All     How often have you had a weak urinary stream? Not at All     How often have you had to strain to start urination? Not at All     How many times did you typically get up at night to urinate? 4 Times     Total IPSS Score 8  Quality of Life due to urinary symptoms   If you were to spend the rest of your life with your urinary condition just the way it is now how would you feel about that? Mostly Satisfied        Score:  1-7 Mild 8-19 Moderate 20-35 Severe  Reviewed referral notes.    PMH: Past Medical History:  Diagnosis Date  . Asthma   . Diabetes mellitus without complication ([place])   . Hypertension   . Personality disorder    borderline    Surgical History: Past Surgical History:  Procedure Laterality Date  . ANTERIOR CRUCIATE LIGAMENT REPAIR Right 2006  . [place] SURGERY  2010  . MENISCUS REPAIR Left 1996    Home Medications:  Allergies as of 07/04/2017      Reactions   Losartan    Severe Chest pain.      Medication List       Accurate as of 07/04/17  3:36 PM. Always use your most recent med list.          albuterol 108 (90 Base) MCG/ACT inhaler Commonly known as:  PROVENTIL HFA;VENTOLIN HFA Inhale 1-2 puffs into the lungs every 6 (six)  hours as needed.   amLODipine 10 MG tablet Commonly known as:  NORVASC TAKE ONE TABLET BY MOUTH EVERY DAY   aspirin EC 81 MG tablet Take 1 tablet (81 mg total) by mouth daily.   budesonide-formoterol 160-4.5 MCG/ACT inhaler Commonly known as:  SYMBICORT Inhale 2 puffs into the lungs 2 (two) times daily.   clonazePAM 0.5 MG tablet Commonly known as:  KLONOPIN TAKE ONE TABLET BY MOUTH TWICE DAILY AS NEEDED   fexofenadine 180 MG tablet Commonly known as:  ALLEGRA TAKE 1 TABLET BY MOUTH DAILY   fluticasone 50 MCG/ACT nasal spray Commonly known as:  FLONASE USE 2 SPRAYS IN BOTH NOSTRILS DAILY   hydrochlorothiazide 12.5 MG capsule Commonly known as:  MICROZIDE Take 1 capsule (12.5 mg total) by mouth daily.   metFORMIN 500 MG 24 hr tablet Commonly known as:  GLUCOPHAGE-XR TAKE ONE TABLET TWICE DAILY BEFORE A MEAL   montelukast 10 MG tablet Commonly known as:  SINGULAIR TAKE ONE TABLET BY MOUTH EVERY MORNING   rosuvastatin 5 MG tablet Commonly known as:  CRESTOR TAKE 1 TABLET BY MOUTH DAILY   sertraline 50 MG tablet Commonly known as:  ZOLOFT Take 1 1/2 tablet q day   sildenafil 20 MG tablet Commonly known as:  REVATIO Take 3 to 5 tablets two hours before intercouse on an empty stomach.  Do not take with nitrates.   VIVLODEX 10 MG Caps Generic drug:  Meloxicam Take 10 mg by mouth as needed.            Discharge Care Instructions        Start     Ordered   07/04/17 0000  Testosterone     07/04/17 1453   07/04/17 0000  sildenafil (REVATIO) 20 MG tablet    Question:  Supervising Provider  Answer:  Hollice Espy   07/04/17 1533      Allergies:  Allergies  Allergen Reactions  . Losartan     Severe Chest pain.    Family History: Family History  Problem Relation Age of Onset  . Diabetes Mother   . Melanoma Mother   . Diabetes Paternal Aunt   . Heart attack Paternal Aunt   . Diabetes Paternal Uncle   . Diabetes Maternal Grandmother   . Diabetes  Maternal  Grandfather   . Heart disease Maternal Grandfather   . Heart attack Maternal Grandfather   . Heart attack Father   . Hypertension Father   . Diabetes Sister   . Hypertension Sister   . Kidney cancer Neg Hx   . Kidney disease Neg Hx   . Prostate cancer Neg Hx     Social History:  reports that he quit smoking about 14 years ago. His smoking use included Cigarettes. He has a 5.00 pack-year smoking history. He quit smokeless tobacco use about 7 years ago. His smokeless tobacco use included Snuff. He reports that he drinks about 2.4 oz of alcohol per week . He reports that he does not use drugs.  ROS: UROLOGY Frequent Urination?: No Hard to postpone urination?: No Burning/pain with urination?: No Get up at night to urinate?: No Leakage of urine?: No Urine stream starts and stops?: No Trouble starting stream?: No Do you have to strain to urinate?: No Blood in urine?: No Urinary tract infection?: No Sexually transmitted disease?: No Injury to kidneys or bladder?: No Painful intercourse?: No Weak stream?: No Erection problems?: Yes Penile pain?: No  Gastrointestinal Nausea?: No Vomiting?: No Indigestion/heartburn?: No Diarrhea?: No Constipation?: No  Constitutional Fever: No Night sweats?: No Weight loss?: No Fatigue?: Yes  Skin Skin rash/lesions?: No Itching?: No  Eyes Blurred vision?: No Double vision?: No  Ears/Nose/Throat Sore throat?: No Sinus problems?: No  Hematologic/Lymphatic Swollen glands?: No Easy bruising?: No  Cardiovascular Leg swelling?: No Chest pain?: No  Respiratory Cough?: No Shortness of breath?: No  Endocrine Excessive thirst?: No  Musculoskeletal Back pain?: No Joint pain?: Yes  Neurological Headaches?: No Dizziness?: No  Psychologic Depression?: No Anxiety?: No  Physical Exam: BP 135/87   Pulse 86   Ht 5\' 11"  (1.803 m)   Wt 250 lb (113.4 kg)   BMI 34.87 kg/m   Constitutional: Well nourished. Alert  and oriented, No acute distress. HEENT: [place] AT, moist mucus membranes. Trachea midline, no masses. Cardiovascular: No clubbing, cyanosis, or edema. Respiratory: Normal respiratory effort, no increased work of breathing. GI: Abdomen is soft, non tender, non distended, no abdominal masses. Liver and spleen not palpable.  No hernias appreciated.  Stool sample for occult testing is not indicated.   GU: No CVA tenderness.  No bladder fullness or masses.  Patient with circumcised phallus.  Urethral meatus is patent.  No penile discharge. No penile lesions or rashes. Scrotum without lesions, cysts, rashes and/or edema.  Testicles are located scrotally bilaterally. No masses are appreciated in the testicles. Left and right epididymis are normal. Rectal: Patient with  normal sphincter tone. Anus and perineum without scarring or rashes. No rectal masses are appreciated. Prostate is approximately 45 grams, no nodules are appreciated. Seminal vesicles are normal. Skin: No rashes, bruises or suspicious lesions. Lymph: No cervical or inguinal adenopathy. Neurologic: Grossly intact, no focal deficits, moving all 4 extremities. Psychiatric: Normal mood and affect.  Laboratory Data: Lab Results  Component Value Date   WBC 7.3 12/22/2016   HGB 16.6 12/22/2016   HCT 48 12/22/2016   MCV 85.3 10/26/2016   PLT 243 12/22/2016    Lab Results  Component Value Date   CREATININE 1.21 10/26/2016    Lab Results  Component Value Date   HGBA1C 6.5 12/22/2016    Lab Results  Component Value Date   TSH 2.64 12/22/2016       Component Value Date/Time   CHOL 209 (A) 12/22/2016   HDL 36 12/22/2016  [place] 152 12/22/2016    Lab Results  Component Value Date   AST 25 12/22/2016   Lab Results  Component Value Date   ALT 40 12/22/2016   I have reviewed the labs.    Assessment & Plan:    1. Erectile dysfunction  - SHIM score is 7  - I explained to the patient that in order to achieve an erection  it takes good functioning of the nervous system (parasympathetic and rs, sympathetic, sensory and motor), good blood flow into the erectile tissue of the penis and a desire to have sex  - I explained that conditions like diabetes, hypertension, coronary artery disease, peripheral vascular disease, smoking, alcohol consumption, age, sleep apnea and BPH can diminish the ability to have an erection  - I explained the ED may be a risk marker for underlying CVD and he should follow up with PCP for further studies - has been evaluated by cardiology  - we will obtain a serum testosterone level at this time; if it is abnormal we will need to repeat the study for confirmation  - A recent study published in Sex Med 2018 Apr 13 revealed moderate to vigorous aerobic exercise for 40 minutes 4 times per week can decrease erectile problems caused by physical inactivity, obesity, hypertension, metabolic syndrome and/or cardiovascular diseases  - We discussed trying a PDE5 inhibitor, sildenafil 20 mg, 3 to 5 tablets prior to intercourseTake the medication two hours prior to intercourse on an empty stomach.  He is warned not to take the medication with medications that contain nitrates.  I also advised him of the side effects, such as: headache, flushing, dyspepsia, abnormal vision, nasal congestion, back pain, myalgia, nausea, dizziness, and rash.  - he will speak with Dr. Nicki Reaper about repeating his sleep study  - RTC pending testosterone results  2. BPH with LUTS  - IPSS score is 8/2  - Continue conservative management, avoiding bladder irritants and timed voiding's   Return for pending testosterone level.  These notes generated with voice recognition software. I apologize for typographical errors.  Zara Council, [facility] [address], [place], [place] [address] [phone]

## 2017-07-04 ENCOUNTER — Encounter: Payer: Self-pay | Admitting: Urology

## 2017-07-04 ENCOUNTER — Ambulatory Visit (INDEPENDENT_AMBULATORY_CARE_PROVIDER_SITE_OTHER): Payer: BLUE CROSS/BLUE SHIELD | Admitting: Urology

## 2017-07-04 VITALS — BP 135/87 | HR 86 | Ht 71.0 in | Wt 250.0 lb

## 2017-07-04 DIAGNOSIS — N401 Enlarged prostate with lower urinary tract symptoms: Secondary | ICD-10-CM | POA: Diagnosis not present

## 2017-07-04 DIAGNOSIS — N138 Other obstructive and reflux uropathy: Secondary | ICD-10-CM | POA: Diagnosis not present

## 2017-07-04 DIAGNOSIS — N529 Male erectile dysfunction, unspecified: Secondary | ICD-10-CM

## 2017-07-04 MED ORDER — SILDENAFIL CITRATE 20 MG PO TABS: ORAL_TABLET | ORAL | 3 refills | Status: DC

## 2017-07-05 ENCOUNTER — Telehealth: Payer: Self-pay

## 2017-07-05 DIAGNOSIS — E291 Testicular hypofunction: Secondary | ICD-10-CM

## 2017-07-05 LAB — TESTOSTERONE: Testosterone: 201 ng/dL — ABNORMAL LOW (ref 264–916)

## 2017-07-05 NOTE — Telephone Encounter (Signed)
-----   Message from Nori Riis, PA-C sent at 07/05/2017  7:20 AM EDT ----- Please let Mr. Francisco Jackson know that his testosterone level returned low.  We will need a morning sample before 9AM for confirmation.

## 2017-07-05 NOTE — Telephone Encounter (Signed)
Patient notified and scheduled for test draw, order placed

## 2017-07-06 ENCOUNTER — Other Ambulatory Visit: Payer: BLUE CROSS/BLUE SHIELD

## 2017-07-06 ENCOUNTER — Other Ambulatory Visit: Payer: Self-pay | Admitting: Internal Medicine

## 2017-07-06 DIAGNOSIS — E291 Testicular hypofunction: Secondary | ICD-10-CM | POA: Diagnosis not present

## 2017-07-07 ENCOUNTER — Telehealth: Payer: Self-pay

## 2017-07-07 LAB — TESTOSTERONE: Testosterone: 314 ng/dL (ref 264–916)

## 2017-07-07 NOTE — Telephone Encounter (Signed)
Patient notified

## 2017-07-07 NOTE — Telephone Encounter (Signed)
-----   Message from Nori Riis, PA-C sent at 07/07/2017  8:25 AM EDT ----- Please let [name] know that his testosterone level was normal.

## 2017-08-09 LAB — CBC AND DIFFERENTIAL
HCT: 47 (ref 41–53)
Hemoglobin: 16.2 (ref 13.5–17.5)
Platelets: 268 (ref 150–399)
WBC: 7.5

## 2017-08-09 LAB — BASIC METABOLIC PANEL
BUN: 15 (ref 4–21)
Creatinine: 1.1 (ref 0.6–1.3)
Glucose: 143
Potassium: 4.5 (ref 3.4–5.3)
Sodium: 139 (ref 137–147)

## 2017-08-09 LAB — HEMOGLOBIN A1C: Hemoglobin A1C: 6.7

## 2017-08-09 LAB — HEPATIC FUNCTION PANEL
ALT: 45 — AB (ref 10–40)
AST: 24 (ref 14–40)
Alkaline Phosphatase: 59 (ref 25–125)
Bilirubin, Total: 0.4

## 2017-08-09 LAB — LIPID PANEL
Cholesterol: 135 (ref 0–200)
LDL Cholesterol: 84
Triglycerides: 80 (ref 40–160)

## 2017-08-09 LAB — TSH: TSH: 2.1 (ref 0.41–5.90)

## 2017-08-15 ENCOUNTER — Ambulatory Visit (INDEPENDENT_AMBULATORY_CARE_PROVIDER_SITE_OTHER): Payer: BLUE CROSS/BLUE SHIELD | Admitting: Internal Medicine

## 2017-08-15 ENCOUNTER — Encounter: Payer: Self-pay | Admitting: Internal Medicine

## 2017-08-15 DIAGNOSIS — J3089 Other allergic rhinitis: Secondary | ICD-10-CM | POA: Diagnosis not present

## 2017-08-15 DIAGNOSIS — E7800 Pure hypercholesterolemia, unspecified: Secondary | ICD-10-CM

## 2017-08-15 DIAGNOSIS — E119 Type 2 diabetes mellitus without complications: Secondary | ICD-10-CM

## 2017-08-15 DIAGNOSIS — R945 Abnormal results of liver function studies: Secondary | ICD-10-CM | POA: Diagnosis not present

## 2017-08-15 DIAGNOSIS — N529 Male erectile dysfunction, unspecified: Secondary | ICD-10-CM | POA: Diagnosis not present

## 2017-08-15 DIAGNOSIS — I10 Essential (primary) hypertension: Secondary | ICD-10-CM

## 2017-08-15 DIAGNOSIS — F439 Reaction to severe stress, unspecified: Secondary | ICD-10-CM | POA: Diagnosis not present

## 2017-08-15 DIAGNOSIS — R7989 Other specified abnormal findings of blood chemistry: Secondary | ICD-10-CM | POA: Insufficient documentation

## 2017-08-15 LAB — HM DIABETES FOOT EXAM

## 2017-08-15 MED ORDER — FLUTICASONE PROPIONATE 50 MCG/ACT NA SUSP: NASAL | 3 refills | Status: DC

## 2017-08-15 MED ORDER — SERTRALINE HCL 100 MG PO TABS: 100.0000 mg | ORAL_TABLET | Freq: Every day | ORAL | 3 refills | Status: DC

## 2017-08-15 NOTE — Progress Notes (Signed)
Patient ID: Francisco Jackson, male   DOB: [birth year], 41 y.o.   MRN: [id number]   Subjective:    Patient ID: Francisco Jackson, male    DOB: March 24, 1976, 41 y.o.   MRN: [id number]  HPI  Patient here for a scheduled follow up.  States he is doing better.  Feels better.  Weight has increased some. Discussed diet and exercise.  Taking zoloft.  Doing better.  Feels may need to increase dose.  No chest pain.  No sob.  No acid reflux.  No abdominal pain.  Bowels moving.  Seeing urology.  Discussed labs.  a1c 6.7.  Discussed possible fatty liver.     Past Medical History:  Diagnosis Date  . Asthma   . Diabetes mellitus without complication ([place])   . Hypertension   . Personality disorder ([place])    borderline   Past Surgical History:  Procedure Laterality Date  . ANTERIOR CRUCIATE LIGAMENT REPAIR Right 2006  . [place] SURGERY  2010  . MENISCUS REPAIR Left 1996   Family History  Problem Relation Age of Onset  . Diabetes Mother   . Melanoma Mother   . Diabetes Paternal Aunt   . Heart attack Paternal Aunt   . Diabetes Paternal Uncle   . Diabetes Maternal Grandmother   . Diabetes Maternal Grandfather   . Heart disease Maternal Grandfather   . Heart attack Maternal Grandfather   . Heart attack Father   . Hypertension Father   . Diabetes Sister   . Hypertension Sister   . Kidney cancer Neg Hx   . Kidney disease Neg Hx   . Prostate cancer Neg Hx    Social History   Socioeconomic History  . Marital status: Married    Spouse name: None  . Number of children: None  . Years of education: None  . Highest education level: None  Social Needs  . Financial resource strain: None  . Food insecurity - worry: None  . Food insecurity - inability: None  . Transportation needs - medical: None  . Transportation needs - non-medical: None  Occupational History  . None  Tobacco Use  . Smoking status: Former Smoker    Packs/day: 0.50    Years: 10.00    Pack years: 5.00    Types:  Cigarettes    Last attempt to quit: 10/11/2002    Years since quitting: 14.8  . Smokeless tobacco: Former Systems developer    Types: Snuff    Quit date: 10/11/2009  Substance and Sexual Activity  . Alcohol use: Yes    Alcohol/week: 2.4 oz    Types: 4 Glasses of wine per week    Comment: ocassional  . Drug use: No  . Sexual activity: None  Other Topics Concern  . None  Social History Narrative  . None    Outpatient Encounter Medications as of 08/15/2017  Medication Sig  . albuterol (PROVENTIL HFA;VENTOLIN HFA) 108 (90 BASE) MCG/ACT inhaler Inhale 1-2 puffs into the lungs every 6 (six) hours as needed.  . ALLERGY RELIEF 180 MG tablet TAKE ONE TABLET BY MOUTH EVERY DAY  . amLODipine (NORVASC) 10 MG tablet TAKE ONE TABLET BY MOUTH EVERY DAY  . aspirin EC 81 MG tablet Take 1 tablet (81 mg total) by mouth daily.  . budesonide-formoterol (SYMBICORT) 160-4.5 MCG/ACT inhaler Inhale 2 puffs into the lungs 2 (two) times daily.  . clonazePAM (KLONOPIN) 0.5 MG tablet TAKE ONE TABLET BY MOUTH TWICE DAILY AS NEEDED  . fluticasone (FLONASE) 50 MCG/ACT  nasal spray USE 2 SPRAYS IN BOTH NOSTRILS DAILY  . hydrochlorothiazide (MICROZIDE) 12.5 MG capsule Take 1 capsule (12.5 mg total) by mouth daily.  . Meloxicam (VIVLODEX) 10 MG CAPS Take 10 mg by mouth as needed.   . metFORMIN (GLUCOPHAGE-XR) 500 MG 24 hr tablet TAKE ONE TABLET TWICE DAILY BEFORE A MEAL  . montelukast (SINGULAIR) 10 MG tablet TAKE ONE TABLET BY MOUTH EVERY MORNING  . rosuvastatin (CRESTOR) 5 MG tablet TAKE 1 TABLET BY MOUTH DAILY  . sildenafil (REVATIO) 20 MG tablet Take 3 to 5 tablets two hours before intercouse on an empty stomach.  Do not take with nitrates.  . [DISCONTINUED] fluticasone (FLONASE) 50 MCG/ACT nasal spray USE 2 SPRAYS IN BOTH NOSTRILS DAILY  . [DISCONTINUED] sertraline (ZOLOFT) 50 MG tablet Take 1 1/2 tablet q day  . sertraline (ZOLOFT) 100 MG tablet Take 1 tablet (100 mg total) daily by mouth.   No facility-administered encounter  medications on file as of 08/15/2017.     Review of Systems  Constitutional: Negative for appetite change and unexpected weight change.  HENT: Negative for congestion and sinus pressure.   Respiratory: Negative for cough, chest tightness and shortness of breath.   Cardiovascular: Negative for chest pain, palpitations and leg swelling.  Gastrointestinal: Negative for abdominal pain, diarrhea, nausea and vomiting.  Genitourinary: Negative for difficulty urinating and dysuria.  Musculoskeletal: Negative for back pain and joint swelling.  Skin: Negative for color change and rash.  Neurological: Negative for dizziness, light-headedness and headaches.  Psychiatric/Behavioral: Negative for agitation and dysphoric mood.       Feels better on zoloft.        Objective:    Physical Exam  Constitutional: He appears well-developed and well-nourished. No distress.  HENT:  Nose: Nose normal.  Mouth/Throat: Oropharynx is clear and moist.  Neck: Neck supple. No thyromegaly present.  Cardiovascular: Normal rate and regular rhythm.  Pulmonary/Chest: Effort normal and breath sounds normal. No respiratory distress.  Abdominal: Soft. Bowel sounds are normal. There is no tenderness.  Musculoskeletal: He exhibits no edema or tenderness.  Feet:  Intact to light touch and pin prick.  DP pulses palpable and equal bilaterally.    Lymphadenopathy:    He has no cervical adenopathy.  Skin: No rash noted. No erythema.  Psychiatric: He has a normal mood and affect. His behavior is normal.    BP 130/84   Pulse 76   Temp 98.3 F (36.8 C) (Oral)   Resp 14   Ht 5' 11"  (1.803 m)   Wt 256 lb (116.1 kg)   SpO2 97%   BMI 35.70 kg/m  Wt Readings from Last 3 Encounters:  08/15/17 256 lb (116.1 kg)  07/04/17 250 lb (113.4 kg)  06/20/17 248 lb 12.8 oz (112.9 kg)     Lab Results  Component Value Date   WBC 7.3 12/22/2016   HGB 16.6 12/22/2016   HCT 48 12/22/2016   PLT 243 12/22/2016   GLUCOSE 198 (H)  10/26/2016   CHOL 209 (A) 12/22/2016   TRIG 105 12/22/2016   HDL 36 12/22/2016   LDLCALC 152 12/22/2016   ALT 40 12/22/2016   AST 25 12/22/2016   NA 136 10/26/2016   K 3.8 10/26/2016   CL 104 10/26/2016   CREATININE 1.21 10/26/2016   BUN 19 10/26/2016   CO2 24 10/26/2016   TSH 2.64 12/22/2016   HGBA1C 6.5 12/22/2016   MICROALBUR 138.8 12/22/2016    Ct Cardiac Scoring  Addendum Date: 02/10/2017  ADDENDUM REPORT: 02/10/2017 12:14 CLINICAL DATA:  Risk stratification EXAM: Coronary Calcium Score TECHNIQUE: The patient was scanned on a Siemens Somatom 64 slice scanner. Axial non-contrast 3 mm slices were carried out through the heart. The data set was analyzed on a dedicated work station and scored using the [place]. FINDINGS: Non-cardiac: See separate report from [facility]. Ascending Aorta:  3.3 cm Pericardium: Normal Coronary arteries: Small foci of calcium seen in proximal LAD/ostial D1 area IMPRESSION: Coronary calcium score of 1.5 . This was 50th percentile for age and sex matched control. Jenkins Rouge Electronically Signed   By: Jenkins Rouge M.D.   On: 02/10/2017 12:14   Result Date: 02/10/2017 EXAM: OVER-READ INTERPRETATION  CT CHEST The following report is an over-read performed by radiologist Dr. Alvino Blood [facility], [place] on 02/10/2017. This over-read does not include interpretation of cardiac or coronary anatomy or pathology. The coronary calcium score interpretation by the cardiologist is attached. COMPARISON:  None. FINDINGS: Limited view of the lung parenchyma demonstrates no suspicious nodularity. Airways are normal. Limited view of the mediastinum demonstrates no adenopathy. Esophagus normal. Limited view of the upper abdomen unremarkable. Limited view of the skeleton and chest wall is unremarkable. IMPRESSION: No significant extracardiac findings. Electronically Signed: By: Suzy Bouchard M.D. On: 02/10/2017 12:03       Assessment & Plan:    Problem List Items Addressed This Visit    Abnormal liver function tests    ALT slightly elevated.  Remainder of liver function tests wnl.  Low carb diet and exercise.  Follow liver function tests.        Diabetes mellitus ([place])    Low carb diet and exercise.  Discussed recent labs.  a1c 6.7.  Follow met b and a1c.        Erectile dysfunction    Seeing urology.  On sildenafil - prn.  Follow.        Essential hypertension    Blood pressure as outlined.  Follow pressures.  Follow metabolic panel.        Hypercholesterolemia    Low cholesterol diet and exercise.  On crestor.  Follow lipid panel and liver function tests.  LDL on recent check 84.        Stress    Increased stress.  Overall doing better.  Will increase zoloft to 185m q day.  Follow.         Other Visit Diagnoses    Environmental and seasonal allergies       Relevant Medications   fluticasone (FLONASE) 50 MCG/ACT nasal spray       SEinar Pheasant MD

## 2017-08-17 ENCOUNTER — Encounter: Payer: Self-pay | Admitting: Internal Medicine

## 2017-08-17 NOTE — Assessment & Plan Note (Signed)
Seeing urology.  On sildenafil - prn.  Follow.

## 2017-08-17 NOTE — Assessment & Plan Note (Signed)
Low carb diet and exercise.  Discussed recent labs.  a1c 6.7.  Follow met b and a1c.

## 2017-08-17 NOTE — Assessment & Plan Note (Signed)
Low cholesterol diet and exercise.  On crestor.  Follow lipid panel and liver function tests.  LDL on recent check 84.

## 2017-08-17 NOTE — Assessment & Plan Note (Signed)
ALT slightly elevated.  Remainder of liver function tests wnl.  Low carb diet and exercise.  Follow liver function tests.

## 2017-08-17 NOTE — Assessment & Plan Note (Signed)
Blood pressure as outlined.  Follow pressures.  Follow metabolic panel.   

## 2017-08-17 NOTE — Assessment & Plan Note (Signed)
Increased stress.  Overall doing better.  Will increase zoloft to 100mg  q day.  Follow.

## 2017-08-27 ENCOUNTER — Other Ambulatory Visit: Payer: Self-pay | Admitting: Internal Medicine

## 2017-09-02 ENCOUNTER — Other Ambulatory Visit: Payer: Self-pay | Admitting: Internal Medicine

## 2017-10-05 ENCOUNTER — Other Ambulatory Visit: Payer: Self-pay | Admitting: Internal Medicine

## 2017-11-08 ENCOUNTER — Ambulatory Visit: Payer: BLUE CROSS/BLUE SHIELD | Admitting: Internal Medicine

## 2017-12-10 ENCOUNTER — Other Ambulatory Visit: Payer: Self-pay | Admitting: Internal Medicine

## 2018-01-09 ENCOUNTER — Ambulatory Visit
Admission: RE | Admit: 2018-01-09 | Discharge: 2018-01-09 | Disposition: A | Discharge status: Home / Self Care | Payer: BLUE CROSS/BLUE SHIELD | Source: Ambulatory Visit | Attending: Family Medicine | Admitting: Family Medicine

## 2018-01-09 ENCOUNTER — Other Ambulatory Visit: Payer: Self-pay | Admitting: Family Medicine

## 2018-01-09 ENCOUNTER — Telehealth: Payer: Self-pay

## 2018-01-09 DIAGNOSIS — R509 Fever, unspecified: Secondary | ICD-10-CM | POA: Diagnosis not present

## 2018-01-09 DIAGNOSIS — R05 Cough: Secondary | ICD-10-CM | POA: Insufficient documentation

## 2018-01-09 DIAGNOSIS — R059 Cough, unspecified: Secondary | ICD-10-CM

## 2018-01-09 NOTE — Telephone Encounter (Signed)
Copied from [place] [phone]. Topic: Inquiry >> Jan 09, 2018  8:46 AM Pricilla Handler wrote: Reason for CRM: Patient's wife Nevin Bloodgood called wanting Dr. Nicki Reaper to call her today at [phone]. Nevin Bloodgood is very concerned about her husband's health. She states that he is losing weight and gets fevers often. Patient is coughing and sweezing a lot. Patient is a diabetic and has had constant High Blood Sugar. Patient's wife wants Dr. Nicki Reaper to call her today. Nevin Bloodgood states that she wants to speak with Dr. Nicki Reaper only.       Thank You!!!

## 2018-01-10 ENCOUNTER — Other Ambulatory Visit: Payer: Self-pay | Admitting: Internal Medicine

## 2018-01-10 NOTE — Telephone Encounter (Signed)
Pt scheduled with Dr. Nicki Reaper tomorrow

## 2018-01-11 ENCOUNTER — Ambulatory Visit (INDEPENDENT_AMBULATORY_CARE_PROVIDER_SITE_OTHER): Payer: BLUE CROSS/BLUE SHIELD | Admitting: Internal Medicine

## 2018-01-11 ENCOUNTER — Encounter: Payer: Self-pay | Admitting: Internal Medicine

## 2018-01-11 DIAGNOSIS — I10 Essential (primary) hypertension: Secondary | ICD-10-CM | POA: Diagnosis not present

## 2018-01-11 DIAGNOSIS — E119 Type 2 diabetes mellitus without complications: Secondary | ICD-10-CM

## 2018-01-11 DIAGNOSIS — E7800 Pure hypercholesterolemia, unspecified: Secondary | ICD-10-CM | POA: Diagnosis not present

## 2018-01-11 DIAGNOSIS — J189 Pneumonia, unspecified organism: Secondary | ICD-10-CM | POA: Insufficient documentation

## 2018-01-11 DIAGNOSIS — J181 Lobar pneumonia, unspecified organism: Secondary | ICD-10-CM

## 2018-01-11 NOTE — Patient Instructions (Signed)
mucinex DM - take one tablet in the am and robitussin DM in the evening  flonase nasal spray - 2 sprays each nostril one time per day.  Do this in the evening.    Complete the levaquin.  Ok to take in the am.    Stop mucinex D.    flovent (or symbicort) inhaler - twice a day - every day  Rescue inhaler - as needed   Take a probiotic daily while you are on the antibiotics and for two weeks after completing the antibiotics.    Examples of probiotics:  Culturelle, florastor or align

## 2018-01-11 NOTE — Progress Notes (Signed)
Patient ID: Francisco Jackson, male   DOB: 1976/03/23, 42 y.o.   MRN: [id number]   Subjective:    Patient ID: Francisco Jackson, male    DOB: [birth year], 43 y.o.   MRN: [id number]  HPI  Patient here as a work in for f/u of recent diagnosis of pneumonia.  He is accompanied by his wife.  History obtained from both of them.  States that he started with some intermittent congestion a couple of months ago.  Over the last several weeks, symptoms seem to become more persistent.  Increased cough and congestion.  Was treated with zpak and prednisone.  Xray ordered, but since he got better - did not have the xray at that time.  The following week, he mowed. Noticed increased cough and congestion.  Some wheezing.  Decreased energy.  Went and had the cxr that was previously ordered.  Showed pneumonia.  Was placed on levaquin.  This is the third day.  He is also taking mucinex D, tussionex and using flonase.  No vomiting.  Eating.  Sugars have been elevated - especially when on prednisone.  Did not bring in recorded readings.  feeliing better.     Past Medical History:  Diagnosis Date  . Asthma   . Diabetes mellitus without complication ([place])   . Hypertension   . Personality disorder ([place])    borderline   Past Surgical History:  Procedure Laterality Date  . ANTERIOR CRUCIATE LIGAMENT REPAIR Right 2006  . [place] SURGERY  2010  . MENISCUS REPAIR Left 1996   Family History  Problem Relation Age of Onset  . Diabetes Mother   . Melanoma Mother   . Diabetes Paternal Aunt   . Heart attack Paternal Aunt   . Diabetes Paternal Uncle   . Diabetes Maternal Grandmother   . Diabetes Maternal Grandfather   . Heart disease Maternal Grandfather   . Heart attack Maternal Grandfather   . Heart attack Father   . Hypertension Father   . Diabetes Sister   . Hypertension Sister   . Kidney cancer Neg Hx   . Kidney disease Neg Hx   . Prostate cancer Neg Hx    Social History   Socioeconomic History  .  Marital status: Married    Spouse name: Not on file  . Number of children: Not on file  . Years of education: Not on file  . Highest education level: Not on file  Occupational History  . Not on file  Social Needs  . Financial resource strain: Not on file  . Food insecurity:    Worry: Not on file    Inability: Not on file  . Transportation needs:    Medical: Not on file    Non-medical: Not on file  Tobacco Use  . Smoking status: Former Smoker    Packs/day: 0.50    Years: 10.00    Pack years: 5.00    Types: Cigarettes    Last attempt to quit: 10/11/2002    Years since quitting: 15.2  . Smokeless tobacco: Former Systems developer    Types: Snuff    Quit date: 10/11/2009  Substance and Sexual Activity  . Alcohol use: Yes    Alcohol/week: 2.4 oz    Types: 4 Glasses of wine per week    Comment: ocassional  . Drug use: No  . Sexual activity: Not on file  Lifestyle  . Physical activity:    Days per week: Not on file    Minutes per session:  Not on file  . Stress: Not on file  Relationships  . Social connections:    Talks on phone: Not on file    Gets together: Not on file    Attends religious service: Not on file    Active member of club or organization: Not on file    Attends meetings of clubs or organizations: Not on file    Relationship status: Not on file  Other Topics Concern  . Not on file  Social History Narrative  . Not on file    Outpatient Encounter Medications as of 01/11/2018  Medication Sig  . albuterol (PROVENTIL HFA;VENTOLIN HFA) 108 (90 BASE) MCG/ACT inhaler Inhale 1-2 puffs into the lungs every 6 (six) hours as needed.  . ALLERGY RELIEF 180 MG tablet TAKE ONE TABLET BY MOUTH EVERY DAY  . amLODipine (NORVASC) 10 MG tablet TAKE ONE TABLET EVERY DAY  . aspirin EC 81 MG tablet Take 1 tablet (81 mg total) by mouth daily.  . budesonide-formoterol (SYMBICORT) 160-4.5 MCG/ACT inhaler Inhale 2 puffs into the lungs 2 (two) times daily.  . clonazePAM (KLONOPIN) 0.5 MG tablet TAKE  ONE TABLET BY MOUTH TWICE DAILY AS NEEDED  . fluticasone (FLONASE) 50 MCG/ACT nasal spray USE 2 SPRAYS IN BOTH NOSTRILS DAILY  . hydrochlorothiazide (MICROZIDE) 12.5 MG capsule TAKE 1 CAPSULE BY MOUTH EVERY DAY  . Meloxicam (VIVLODEX) 10 MG CAPS Take 10 mg by mouth as needed.   . metFORMIN (GLUCOPHAGE-XR) 500 MG 24 hr tablet TAKE ONE TABLET TWICE DAILY BEFORE A MEAL  . montelukast (SINGULAIR) 10 MG tablet TAKE ONE TABLET BY MOUTH EVERY MORNING  . rosuvastatin (CRESTOR) 5 MG tablet TAKE ONE TABLET EVERY DAY  . sertraline (ZOLOFT) 100 MG tablet TAKE ONE TABLET BY MOUTH EVERY DAY  . sildenafil (REVATIO) 20 MG tablet Take 3 to 5 tablets two hours before intercouse on an empty stomach.  Do not take with nitrates.   No facility-administered encounter medications on file as of 01/11/2018.     Review of Systems  Constitutional: Negative for appetite change.       Previous fever.  Has lost weight.    HENT: Positive for congestion and postnasal drip. Negative for sinus pressure.   Respiratory: Positive for cough and wheezing. Negative for chest tightness and shortness of breath.   Cardiovascular: Negative for chest pain, palpitations and leg swelling.  Gastrointestinal: Negative for abdominal pain, diarrhea, nausea and vomiting.  Genitourinary: Negative for difficulty urinating and dysuria.  Musculoskeletal: Negative for joint swelling and myalgias.  Skin: Negative for color change and rash.  Neurological: Negative for dizziness, light-headedness and headaches.  Psychiatric/Behavioral: Negative for agitation and dysphoric mood.       Objective:    Physical Exam  Constitutional: He appears well-developed and well-nourished. No distress.  HENT:  Nose: Nose normal.  Mouth/Throat: Oropharynx is clear and moist.  Neck: Neck supple.  Cardiovascular: Normal rate and regular rhythm.  Pulmonary/Chest: Effort normal. No respiratory distress.  Some minimal increased cough with forced expiration.      Abdominal: Soft. Bowel sounds are normal. There is no tenderness.  Musculoskeletal: He exhibits no edema or tenderness.  Lymphadenopathy:    He has no cervical adenopathy.  Skin: No rash noted. No erythema.  Psychiatric: He has a normal mood and affect. His behavior is normal.    BP 136/78 (BP Location: Left Arm, Patient Position: Sitting, Cuff Size: Large)   Pulse 81   Temp 98.1 F (36.7 C) (Oral)   Resp 18  Wt 243 lb (110.2 kg)   SpO2 97%   BMI 33.89 kg/m  Wt Readings from Last 3 Encounters:  01/11/18 243 lb (110.2 kg)  08/15/17 256 lb (116.1 kg)  07/04/17 250 lb (113.4 kg)     Lab Results  Component Value Date   WBC 7.5 08/09/2017   HGB 16.2 08/09/2017   HCT 47 08/09/2017   PLT 268 08/09/2017   GLUCOSE 198 (H) 10/26/2016   CHOL 135 08/09/2017   TRIG 80 08/09/2017   HDL 36 12/22/2016   LDLCALC 84 08/09/2017   ALT 45 (A) 08/09/2017   AST 24 08/09/2017   NA 139 08/09/2017   K 4.5 08/09/2017   CL 104 10/26/2016   CREATININE 1.1 08/09/2017   BUN 15 08/09/2017   CO2 24 10/26/2016   TSH 2.10 08/09/2017   HGBA1C 6.7 08/09/2017   MICROALBUR 138.8 12/22/2016    Dg Chest 2 View  Result Date: 01/09/2018 CLINICAL DATA:  Cough, congestion, fever for 1 month EXAM: CHEST - 2 VIEW COMPARISON:  10/26/2016 FINDINGS: Patchy airspace disease at the left base. Right lung is clear. Upper lungs clear. Normal heart size. No pneumothorax. IMPRESSION: Patchy airspace disease at the left base. Followup PA and lateral chest X-ray is recommended in 3-4 weeks following trial of antibiotic therapy to ensure resolution and exclude underlying malignancy. Electronically Signed   By: Marybelle Killings M.D.   On: 01/09/2018 15:03       Assessment & Plan:   Problem List Items Addressed This Visit    Diabetes mellitus ([place])    Low carb diet and exercise.  Off prednisone.  Follow weight.  Follow met b anda 1c.  Discussed elevation with being sick and prednisone.        Essential hypertension     Stop mucinex D.  Follow pressures.  Same medication regimen.        Hypercholesterolemia    Low cholesterol diet and exercise.  Follow lipid panel.       Pneumonia    Recently diagnosed with pneumonia.  On levaquin.  Symptoms improved.  Complete levaquin.  Stop mucinex D.  Start mucinex DM. Continue flonase daily.  albuteraol inhaler.  Hold on another course of prednisone.  Will need f/u cxr in several weeks.            Einar Pheasant, MD

## 2018-01-13 ENCOUNTER — Ambulatory Visit: Payer: BLUE CROSS/BLUE SHIELD | Admitting: Internal Medicine

## 2018-01-15 ENCOUNTER — Encounter: Payer: Self-pay | Admitting: Internal Medicine

## 2018-01-15 NOTE — Assessment & Plan Note (Signed)
Recently diagnosed with pneumonia.  On levaquin.  Symptoms improved.  Complete levaquin.  Stop mucinex D.  Start mucinex DM. Continue flonase daily.  albuteraol inhaler.  Hold on another course of prednisone.  Will need f/u cxr in several weeks.

## 2018-01-15 NOTE — Assessment & Plan Note (Signed)
Stop mucinex D.  Follow pressures.  Same medication regimen.

## 2018-01-15 NOTE — Assessment & Plan Note (Signed)
Low cholesterol diet and exercise.  Follow lipid panel.   

## 2018-01-15 NOTE — Assessment & Plan Note (Signed)
Low carb diet and exercise.  Off prednisone.  Follow weight.  Follow met b anda 1c.  Discussed elevation with being sick and prednisone.

## 2018-01-18 DIAGNOSIS — J45901 Unspecified asthma with (acute) exacerbation: Secondary | ICD-10-CM | POA: Diagnosis not present

## 2018-01-18 DIAGNOSIS — R05 Cough: Secondary | ICD-10-CM | POA: Diagnosis not present

## 2018-01-18 DIAGNOSIS — Z7984 Long term (current) use of oral hypoglycemic drugs: Secondary | ICD-10-CM | POA: Diagnosis not present

## 2018-01-18 DIAGNOSIS — E119 Type 2 diabetes mellitus without complications: Secondary | ICD-10-CM | POA: Diagnosis not present

## 2018-01-18 DIAGNOSIS — Z87891 Personal history of nicotine dependence: Secondary | ICD-10-CM | POA: Diagnosis not present

## 2018-01-18 DIAGNOSIS — I10 Essential (primary) hypertension: Secondary | ICD-10-CM | POA: Diagnosis not present

## 2018-01-23 ENCOUNTER — Encounter: Payer: Self-pay | Admitting: Internal Medicine

## 2018-01-23 ENCOUNTER — Ambulatory Visit (INDEPENDENT_AMBULATORY_CARE_PROVIDER_SITE_OTHER): Payer: BLUE CROSS/BLUE SHIELD | Admitting: Internal Medicine

## 2018-01-23 DIAGNOSIS — J40 Bronchitis, not specified as acute or chronic: Secondary | ICD-10-CM | POA: Diagnosis not present

## 2018-01-23 DIAGNOSIS — F439 Reaction to severe stress, unspecified: Secondary | ICD-10-CM

## 2018-01-23 DIAGNOSIS — E7800 Pure hypercholesterolemia, unspecified: Secondary | ICD-10-CM

## 2018-01-23 DIAGNOSIS — I10 Essential (primary) hypertension: Secondary | ICD-10-CM

## 2018-01-23 DIAGNOSIS — R945 Abnormal results of liver function studies: Secondary | ICD-10-CM

## 2018-01-23 DIAGNOSIS — N529 Male erectile dysfunction, unspecified: Secondary | ICD-10-CM

## 2018-01-23 DIAGNOSIS — J181 Lobar pneumonia, unspecified organism: Secondary | ICD-10-CM

## 2018-01-23 DIAGNOSIS — R0681 Apnea, not elsewhere classified: Secondary | ICD-10-CM | POA: Diagnosis not present

## 2018-01-23 DIAGNOSIS — R7989 Other specified abnormal findings of blood chemistry: Secondary | ICD-10-CM

## 2018-01-23 DIAGNOSIS — E119 Type 2 diabetes mellitus without complications: Secondary | ICD-10-CM

## 2018-01-23 DIAGNOSIS — J189 Pneumonia, unspecified organism: Secondary | ICD-10-CM

## 2018-01-23 MED ORDER — FLUTICASONE PROPIONATE HFA 110 MCG/ACT IN AERO: 2.0000 | INHALATION_SPRAY | Freq: Two times a day (BID) | RESPIRATORY_TRACT | 2 refills | Status: DC

## 2018-01-23 NOTE — Progress Notes (Signed)
Subjective:    Patient ID: Francisco Jackson, male    DOB: 1976-02-10, 42 y.o.   MRN: [id number]  HPI  Patient here for a scheduled follow up.  He is accompanied by his wife.  History obtained from both of them.  Was seen here on 01/11/18 with concern regarding pneumonia.  Had outside cxr that revealed concern regarding pneumonia.  Had been placed on levaquin.  When I saw him, he was feeling better.  Went to [place].  While in [place], became more sob.  Was seen in ER on 01/18/18 (in [place]) and was diagnosed with asthma exacerbation and bronchitis.  Was given nebs in ER.  Had f/u cxr.  Do not have results.  Placed on augmentin and prednisone. Has completed prednisone therapy.  Still on augmentin.  Doing better.  Decreased cough and congestion.  Breathing better.  No increased sob.  Eating better.  Appetite better.  No checking his sugars regularly.  No chest pain.  No acid reflux.  No abdominal pain.  Bowels moving.  Some increased stress.  Increased fatigue.  Does not wake up rested.  Wife has noticed episodes of apnea.  Also concerned regarding low testosterone.  Some trouble at times obtaining/sustaining an erection.      Past Medical History:  Diagnosis Date  . Asthma   . Diabetes mellitus without complication ([place])   . Hypertension   . Personality disorder ([place])    borderline   Past Surgical History:  Procedure Laterality Date  . ANTERIOR CRUCIATE LIGAMENT REPAIR Right 2006  . [place] SURGERY  2010  . MENISCUS REPAIR Left 1996   Family History  Problem Relation Age of Onset  . Diabetes Mother   . Melanoma Mother   . Diabetes Paternal Aunt   . Heart attack Paternal Aunt   . Diabetes Paternal Uncle   . Diabetes Maternal Grandmother   . Diabetes Maternal Grandfather   . Heart disease Maternal Grandfather   . Heart attack Maternal Grandfather   . Heart attack Father   . Hypertension Father   . Diabetes Sister   . Hypertension Sister   . Kidney cancer Neg Hx   . Kidney  disease Neg Hx   . Prostate cancer Neg Hx    Social History   Socioeconomic History  . Marital status: Married    Spouse name: Not on file  . Number of children: Not on file  . Years of education: Not on file  . Highest education level: Not on file  Occupational History  . Not on file  Social Needs  . Financial resource strain: Not on file  . Food insecurity:    Worry: Not on file    Inability: Not on file  . Transportation needs:    Medical: Not on file    Non-medical: Not on file  Tobacco Use  . Smoking status: Former Smoker    Packs/day: 0.50    Years: 10.00    Pack years: 5.00    Types: Cigarettes    Last attempt to quit: 10/11/2002    Years since quitting: 15.2  . Smokeless tobacco: Former Systems developer    Types: Snuff    Quit date: 10/11/2009  Substance and Sexual Activity  . Alcohol use: Yes    Alcohol/week: 2.4 oz    Types: 4 Glasses of wine per week    Comment: ocassional  . Drug use: No  . Sexual activity: Not on file  Lifestyle  . Physical activity:  Days per week: Not on file    Minutes per session: Not on file  . Stress: Not on file  Relationships  . Social connections:    Talks on phone: Not on file    Gets together: Not on file    Attends religious service: Not on file    Active member of club or organization: Not on file    Attends meetings of clubs or organizations: Not on file    Relationship status: Not on file  Other Topics Concern  . Not on file  Social History Narrative  . Not on file    Outpatient Encounter Medications as of 01/23/2018  Medication Sig  . albuterol (PROVENTIL HFA;VENTOLIN HFA) 108 (90 BASE) MCG/ACT inhaler Inhale 1-2 puffs into the lungs every 6 (six) hours as needed.  . ALLERGY RELIEF 180 MG tablet TAKE ONE TABLET BY MOUTH EVERY DAY  . amLODipine (NORVASC) 10 MG tablet TAKE ONE TABLET EVERY DAY  . amoxicillin-clavulanate (AUGMENTIN) 875-125 MG tablet TK 1 T PO  Q 12 H FOR 10 DAYS  . aspirin EC 81 MG tablet Take 1 tablet (81  mg total) by mouth daily.  . budesonide-formoterol (SYMBICORT) 160-4.5 MCG/ACT inhaler Inhale 2 puffs into the lungs 2 (two) times daily.  . clonazePAM (KLONOPIN) 0.5 MG tablet TAKE ONE TABLET BY MOUTH TWICE DAILY AS NEEDED  . fluticasone (FLONASE) 50 MCG/ACT nasal spray USE 2 SPRAYS IN BOTH NOSTRILS DAILY  . hydrochlorothiazide (MICROZIDE) 12.5 MG capsule TAKE 1 CAPSULE BY MOUTH EVERY DAY  . Meloxicam (VIVLODEX) 10 MG CAPS Take 10 mg by mouth as needed.   . metFORMIN (GLUCOPHAGE-XR) 500 MG 24 hr tablet TAKE ONE TABLET TWICE DAILY BEFORE A MEAL  . montelukast (SINGULAIR) 10 MG tablet TAKE ONE TABLET BY MOUTH EVERY MORNING  . rosuvastatin (CRESTOR) 5 MG tablet TAKE ONE TABLET EVERY DAY  . sertraline (ZOLOFT) 100 MG tablet TAKE ONE TABLET BY MOUTH EVERY DAY  . sildenafil (REVATIO) 20 MG tablet Take 3 to 5 tablets two hours before intercouse on an empty stomach.  Do not take with nitrates.  . fluticasone (FLOVENT HFA) 110 MCG/ACT inhaler Inhale 2 puffs into the lungs 2 (two) times daily.   No facility-administered encounter medications on file as of 01/23/2018.     Review of Systems  Constitutional: Positive for fatigue.       Appetite better.   HENT: Negative for congestion and sinus pressure.   Respiratory: Positive for cough. Negative for chest tightness and shortness of breath.   Cardiovascular: Negative for chest pain, palpitations and leg swelling.  Gastrointestinal: Negative for abdominal pain, diarrhea, nausea and vomiting.  Genitourinary: Negative for difficulty urinating and dysuria.       Some trouble sustaining and obtaining an erection.   Musculoskeletal: Negative for joint swelling and myalgias.  Skin: Negative for color change and rash.  Neurological: Negative for dizziness, light-headedness and headaches.  Psychiatric/Behavioral: Negative for dysphoric mood.       Increased stress.        Objective:     Blood pressure rechecked by me:  122/78  Physical Exam    Constitutional: He appears well-developed and well-nourished. No distress.  HENT:  Nose: Nose normal.  Mouth/Throat: Oropharynx is clear and moist.  Eyes: Conjunctivae are normal. Right eye exhibits no discharge. Left eye exhibits no discharge.  Neck: Neck supple. No thyromegaly present.  Cardiovascular: Normal rate and regular rhythm.  Pulmonary/Chest: Effort normal and breath sounds normal. No respiratory distress.  Abdominal:  Soft. Bowel sounds are normal. There is no tenderness.  Musculoskeletal: He exhibits no edema or tenderness.  Lymphadenopathy:    He has no cervical adenopathy.  Skin: No rash noted. No erythema.  Psychiatric: He has a normal mood and affect. His behavior is normal.    BP 120/82 (BP Location: Left Arm, Patient Position: Sitting, Cuff Size: Large)   Pulse 91   Temp 97.9 F (36.6 C) (Oral)   Resp 18   Wt 244 lb 9.6 oz (110.9 kg)   SpO2 95%   BMI 34.11 kg/m  Wt Readings from Last 3 Encounters:  01/23/18 244 lb 9.6 oz (110.9 kg)  01/11/18 243 lb (110.2 kg)  08/15/17 256 lb (116.1 kg)     Lab Results  Component Value Date   WBC 7.5 08/09/2017   HGB 16.2 08/09/2017   HCT 47 08/09/2017   PLT 268 08/09/2017   GLUCOSE 198 (H) 10/26/2016   CHOL 135 08/09/2017   TRIG 80 08/09/2017   HDL 36 12/22/2016   LDLCALC 84 08/09/2017   ALT 45 (A) 08/09/2017   AST 24 08/09/2017   NA 139 08/09/2017   K 4.5 08/09/2017   CL 104 10/26/2016   CREATININE 1.1 08/09/2017   BUN 15 08/09/2017   CO2 24 10/26/2016   TSH 2.10 08/09/2017   HGBA1C 6.7 08/09/2017   MICROALBUR 138.8 12/22/2016    Dg Chest 2 View  Result Date: 01/09/2018 CLINICAL DATA:  Cough, congestion, fever for 1 month EXAM: CHEST - 2 VIEW COMPARISON:  10/26/2016 FINDINGS: Patchy airspace disease at the left base. Right lung is clear. Upper lungs clear. Normal heart size. No pneumothorax. IMPRESSION: Patchy airspace disease at the left base. Followup PA and lateral chest X-ray is recommended in 3-4  weeks following trial of antibiotic therapy to ensure resolution and exclude underlying malignancy. Electronically Signed   By: Marybelle Killings M.D.   On: 01/09/2018 15:03       Assessment & Plan:   Problem List Items Addressed This Visit    Abnormal liver function tests    Low carb diet and exercise.  Follow liver panel.       Diabetes mellitus ([place])    Not checking his sugars.  Discussed the need to check.  Low carb diet and exercise. Continue metformin.  Check met b and a1c.  Keep up to date with eye checks.        Erectile dysfunction    Has seen urology.  Trouble with sustaining/obtaining an erection and decreased libido.  Probably multifactorial.  Wants testosterone rechecked.  Follow.        Essential hypertension    Blood pressure doing well.  Continue same medication regimen.  Follow pressures.  Follow metabolic panel.       Frequent episodes of bronchitis    Has had intermittent and persistent episodes of cough and congestion.  Recently diagnosed with pneumonia.  cxr as outlined.  Add steroid inhaler.  Completed abx for recent infection.  Rescue inhaler if needed.  Discussed the need for formal pulmonary evaluation.  He agrees.        Relevant Orders   Ambulatory referral to Pulmonology   Hypercholesterolemia    Low cholesterol diet and exercise.  On crestor.  Follow lipid panel and liver function tests.        Pneumonia    Recently diagnosed with pneumonia.  cxr as outlined.  Will need f/u cxr.  On augmentin now.  Add steroid inhaler.  Continue rescue  inhaler as needed.  Continue mucinex and nasal spray.        Relevant Medications   amoxicillin-clavulanate (AUGMENTIN) 875-125 MG tablet   fluticasone (FLOVENT HFA) 110 MCG/ACT inhaler   Stress    Increased stress.  On zoloft.  Discussed with him today.  Hold on making changes.  Follow.        Witnessed apneic spells    Concern over possible sleep apnea.  Wife reports witnessed apneic episodes.  Does not feel rested  when wakes up.  Discussed with him today.  Will refer to pulmonary for evaluation for sleep study and further w/up as outlined.        Relevant Orders   Ambulatory referral to Pulmonology      I spent 40 minutes with the patient and more than 50% of the time was spent in consultation regarding the above.  Time spent discussing his recent diagnosis and treatment and his ongoing symptoms.  Also discussed the need for further evaluation and continued treatment and testing.      Einar Pheasant, MD

## 2018-01-24 ENCOUNTER — Encounter: Payer: Self-pay | Admitting: Internal Medicine

## 2018-01-24 DIAGNOSIS — R0681 Apnea, not elsewhere classified: Secondary | ICD-10-CM | POA: Insufficient documentation

## 2018-01-24 DIAGNOSIS — G4730 Sleep apnea, unspecified: Secondary | ICD-10-CM | POA: Insufficient documentation

## 2018-01-24 NOTE — Assessment & Plan Note (Signed)
Increased stress.  On zoloft.  Discussed with him today.  Hold on making changes.  Follow.

## 2018-01-24 NOTE — Assessment & Plan Note (Signed)
Has had intermittent and persistent episodes of cough and congestion.  Recently diagnosed with pneumonia.  cxr as outlined.  Add steroid inhaler.  Completed abx for recent infection.  Rescue inhaler if needed.  Discussed the need for formal pulmonary evaluation.  He agrees.

## 2018-01-24 NOTE — Assessment & Plan Note (Signed)
Low cholesterol diet and exercise.  On crestor.  Follow lipid panel and liver function tests.  

## 2018-01-24 NOTE — Assessment & Plan Note (Signed)
Concern over possible sleep apnea.  Wife reports witnessed apneic episodes.  Does not feel rested when wakes up.  Discussed with him today.  Will refer to pulmonary for evaluation for sleep study and further w/up as outlined.

## 2018-01-24 NOTE — Assessment & Plan Note (Signed)
Recently diagnosed with pneumonia.  cxr as outlined.  Will need f/u cxr.  On augmentin now.  Add steroid inhaler.  Continue rescue inhaler as needed.  Continue mucinex and nasal spray.

## 2018-01-24 NOTE — Assessment & Plan Note (Signed)
Blood pressure doing well.  Continue same medication regimen.  Follow pressures.  Follow metabolic panel.

## 2018-01-24 NOTE — Assessment & Plan Note (Signed)
Not checking his sugars.  Discussed the need to check.  Low carb diet and exercise. Continue metformin.  Check met b and a1c.  Keep up to date with eye checks.   

## 2018-01-24 NOTE — Assessment & Plan Note (Signed)
Low carb diet and exercise.  Follow liver panel.   

## 2018-01-24 NOTE — Assessment & Plan Note (Signed)
Has seen urology.  Trouble with sustaining/obtaining an erection and decreased libido.  Probably multifactorial.  Wants testosterone rechecked.  Follow.

## 2018-01-25 LAB — BASIC METABOLIC PANEL
BUN: 23 — AB (ref 4–21)
Creatinine: 1.1 (ref 0.6–1.3)
Glucose: 167

## 2018-01-25 LAB — LIPID PANEL
Cholesterol: 150 (ref 0–200)
HDL: 43 (ref 35–70)
LDL Cholesterol: 75
Triglycerides: 158 (ref 40–160)

## 2018-01-25 LAB — HEPATIC FUNCTION PANEL
ALT: 44 — AB (ref 10–40)
AST: 20 (ref 14–40)
Alkaline Phosphatase: 68 (ref 25–125)
Bilirubin, Total: 0.3

## 2018-01-25 LAB — CBC AND DIFFERENTIAL
HCT: 47 (ref 41–53)
Hemoglobin: 16.5 (ref 13.5–17.5)
Neutrophils Absolute: 10
Platelets: 255 (ref 150–399)
WBC: 13.9

## 2018-01-25 LAB — IRON,TIBC AND FERRITIN PANEL: Iron: 43

## 2018-01-25 LAB — HEMOGLOBIN A1C: Hemoglobin A1C: 8.4

## 2018-01-25 LAB — TSH: TSH: 2.71 (ref 0.41–5.90)

## 2018-02-06 DIAGNOSIS — R0609 Other forms of dyspnea: Secondary | ICD-10-CM | POA: Diagnosis not present

## 2018-02-06 DIAGNOSIS — J189 Pneumonia, unspecified organism: Secondary | ICD-10-CM | POA: Diagnosis not present

## 2018-02-06 DIAGNOSIS — G479 Sleep disorder, unspecified: Secondary | ICD-10-CM | POA: Diagnosis not present

## 2018-02-06 DIAGNOSIS — R05 Cough: Secondary | ICD-10-CM | POA: Diagnosis not present

## 2018-02-08 ENCOUNTER — Other Ambulatory Visit: Payer: Self-pay | Admitting: Internal Medicine

## 2018-03-09 DIAGNOSIS — R05 Cough: Secondary | ICD-10-CM | POA: Diagnosis not present

## 2018-03-09 DIAGNOSIS — G4710 Hypersomnia, unspecified: Secondary | ICD-10-CM | POA: Diagnosis not present

## 2018-03-09 DIAGNOSIS — R0609 Other forms of dyspnea: Secondary | ICD-10-CM | POA: Diagnosis not present

## 2018-03-12 DIAGNOSIS — G4733 Obstructive sleep apnea (adult) (pediatric): Secondary | ICD-10-CM | POA: Diagnosis not present

## 2018-03-14 ENCOUNTER — Encounter: Payer: Self-pay | Admitting: Internal Medicine

## 2018-03-14 ENCOUNTER — Other Ambulatory Visit: Payer: Self-pay | Admitting: Internal Medicine

## 2018-04-07 DIAGNOSIS — G4733 Obstructive sleep apnea (adult) (pediatric): Secondary | ICD-10-CM | POA: Diagnosis not present

## 2018-04-08 ENCOUNTER — Other Ambulatory Visit: Payer: Self-pay | Admitting: Internal Medicine

## 2018-05-04 ENCOUNTER — Other Ambulatory Visit: Payer: Self-pay | Admitting: Internal Medicine

## 2018-05-06 ENCOUNTER — Other Ambulatory Visit: Payer: Self-pay | Admitting: Internal Medicine

## 2018-05-07 DIAGNOSIS — G4733 Obstructive sleep apnea (adult) (pediatric): Secondary | ICD-10-CM | POA: Diagnosis not present

## 2018-05-09 DIAGNOSIS — J309 Allergic rhinitis, unspecified: Secondary | ICD-10-CM | POA: Diagnosis not present

## 2018-05-09 DIAGNOSIS — J4530 Mild persistent asthma, uncomplicated: Secondary | ICD-10-CM | POA: Diagnosis not present

## 2018-05-09 DIAGNOSIS — J3089 Other allergic rhinitis: Secondary | ICD-10-CM | POA: Diagnosis not present

## 2018-05-09 DIAGNOSIS — J3081 Allergic rhinitis due to animal (cat) (dog) hair and dander: Secondary | ICD-10-CM | POA: Diagnosis not present

## 2018-05-15 DIAGNOSIS — J3081 Allergic rhinitis due to animal (cat) (dog) hair and dander: Secondary | ICD-10-CM | POA: Diagnosis not present

## 2018-05-15 DIAGNOSIS — J3089 Other allergic rhinitis: Secondary | ICD-10-CM | POA: Diagnosis not present

## 2018-05-29 ENCOUNTER — Ambulatory Visit: Payer: Self-pay

## 2018-05-29 NOTE — Telephone Encounter (Signed)
Patient's wife called and patient was present on the speaker phone with c/o "high blood sugar." She says "his blood sugar 2 weeks ago was over 500. He was thirsty and kept going out of the movies to get something to drink. He checked it when he got home and it was over 500. He checked it a few minutes ago and it was 277. He only takes Metformin twice a day." I asked about other symptoms, she says "frequent urination." According to protocol, home care, but I recommended him to see a provider. He asked when will Dr. Nicki Reaper be back in the office, I advised Monday, but her schedule is full all the way until towards the end of September. He says "can you just let her know on Monday how my blood sugars have been running." I advised should come into the office this week to be evaluated by another provider, just in case he may need medication adjustment to better regulate his blood sugar, he agrees. Appointment scheduled for Wednesday, 05/31/18 at 75 with Philis Nettle, FNP, care advice given, patient verbalized understanding.   Reason for Disposition . [1] Blood glucose 240 - 300 mg/dL (13.3 - 16.7 mmol/L) AND [2] does not  use insulin (e.g., not insulin-dependent; most people with type 2 diabetes)  Answer Assessment - Initial Assessment Questions 1. BLOOD GLUCOSE: "What is your blood glucose level?"      277 2. ONSET: "When did you check the blood glucose?"     While on hold 3. USUAL RANGE: "What is your glucose level usually?" (e.g., usual fasting morning value, usual evening value)     Not consistently checked 4. KETONES: "Do you check for ketones (urine or blood test strips)?" If yes, ask: "What does the test show now?"      No 5. TYPE 1 or 2:  "Do you know what type of diabetes you have?"  (e.g., Type 1, Type 2, Gestational; doesn't know)      Type 2 6. INSULIN: "Do you take insulin?" "What type of insulin(s) do you use? What is the mode of delivery? (syringe, pen (e.g., injection or  pump)?"       No 7. DIABETES PILLS: "Do you take any pills for your diabetes?" If yes, ask: "Have you missed taking any pills recently?"     Metformin bid 8. OTHER SYMPTOMS: "Do you have any symptoms?" (e.g., fever, frequent urination, difficulty breathing, dizziness, weakness, vomiting)     Frequent urination 9. PREGNANCY: "Is there any chance you are pregnant?" "When was your last menstrual period?"     N/A  Protocols used: DIABETES - HIGH BLOOD SUGAR-A-AH

## 2018-05-31 ENCOUNTER — Other Ambulatory Visit: Payer: Self-pay | Admitting: Internal Medicine

## 2018-05-31 ENCOUNTER — Encounter: Payer: Self-pay | Admitting: Family Medicine

## 2018-05-31 ENCOUNTER — Ambulatory Visit (INDEPENDENT_AMBULATORY_CARE_PROVIDER_SITE_OTHER): Payer: BLUE CROSS/BLUE SHIELD | Admitting: Family Medicine

## 2018-05-31 VITALS — BP 128/98 | HR 73 | Temp 98.5°F | Ht 71.0 in | Wt 250.0 lb

## 2018-05-31 DIAGNOSIS — I10 Essential (primary) hypertension: Secondary | ICD-10-CM | POA: Diagnosis not present

## 2018-05-31 DIAGNOSIS — R739 Hyperglycemia, unspecified: Secondary | ICD-10-CM | POA: Diagnosis not present

## 2018-05-31 DIAGNOSIS — E7800 Pure hypercholesterolemia, unspecified: Secondary | ICD-10-CM | POA: Diagnosis not present

## 2018-05-31 DIAGNOSIS — E119 Type 2 diabetes mellitus without complications: Secondary | ICD-10-CM | POA: Diagnosis not present

## 2018-05-31 DIAGNOSIS — E1165 Type 2 diabetes mellitus with hyperglycemia: Secondary | ICD-10-CM

## 2018-05-31 LAB — HEMOGLOBIN A1C: Hgb A1c MFr Bld: 9.2 % — ABNORMAL HIGH (ref 4.6–6.5)

## 2018-05-31 MED ORDER — DULAGLUTIDE 0.75 MG/0.5ML ~~LOC~~ SOAJ: SUBCUTANEOUS | 5 refills | Status: DC

## 2018-05-31 MED ORDER — PEN NEEDLES 30G X 5 MM MISC: 1.0000 | 2 refills | Status: DC

## 2018-05-31 MED ORDER — METFORMIN HCL 1000 MG PO TABS: 1000.0000 mg | ORAL_TABLET | Freq: Two times a day (BID) | ORAL | 3 refills | Status: DC

## 2018-05-31 NOTE — Patient Instructions (Signed)
Diabetes Mellitus and Nutrition When you have diabetes (diabetes mellitus), it is very important to have healthy eating habits because your blood sugar (glucose) levels are greatly affected by what you eat and drink. Eating healthy foods in the appropriate amounts, at about the same times every day, can help you:  Control your blood glucose.  Lower your risk of heart disease.  Improve your blood pressure.  Reach or maintain a healthy weight.  Every person with diabetes is different, and each person has different needs for a meal plan. Your health care provider may recommend that you work with a diet and nutrition specialist (dietitian) to make a meal plan that is best for you. Your meal plan may vary depending on factors such as:  The calories you need.  The medicines you take.  Your weight.  Your blood glucose, blood pressure, and cholesterol levels.  Your activity level.  Other health conditions you have, such as heart or kidney disease.  How do carbohydrates affect me? Carbohydrates affect your blood glucose level more than any other type of food. Eating carbohydrates naturally increases the amount of glucose in your blood. Carbohydrate counting is a method for keeping track of how many carbohydrates you eat. Counting carbohydrates is important to keep your blood glucose at a healthy level, especially if you use insulin or take certain oral diabetes medicines. It is important to know how many carbohydrates you can safely have in each meal. This is different for every person. Your dietitian can help you calculate how many carbohydrates you should have at each meal and for snack. Foods that contain carbohydrates include:  Bread, cereal, rice, pasta, and crackers.  Potatoes and corn.  Peas, beans, and lentils.  Milk and yogurt.  Fruit and juice.  Desserts, such as cakes, cookies, ice cream, and candy.  How does alcohol affect me? Alcohol can cause a sudden decrease in blood  glucose (hypoglycemia), especially if you use insulin or take certain oral diabetes medicines. Hypoglycemia can be a life-threatening condition. Symptoms of hypoglycemia (sleepiness, dizziness, and confusion) are similar to symptoms of having too much alcohol. If your health care provider says that alcohol is safe for you, follow these guidelines:  Limit alcohol intake to no more than 1 drink per day for nonpregnant women and 2 drinks per day for men. One drink equals 12 oz of beer, 5 oz of wine, or 1 oz of hard liquor.  Do not drink on an empty stomach.  Keep yourself hydrated with water, diet soda, or unsweetened iced tea.  Keep in mind that regular soda, juice, and other mixers may contain a lot of sugar and must be counted as carbohydrates.  What are tips for following this plan? Reading food labels  Start by checking the serving size on the label. The amount of calories, carbohydrates, fats, and other nutrients listed on the label are based on one serving of the food. Many foods contain more than one serving per package.  Check the total grams (g) of carbohydrates in one serving. You can calculate the number of servings of carbohydrates in one serving by dividing the total carbohydrates by 15. For example, if a food has 30 g of total carbohydrates, it would be equal to 2 servings of carbohydrates.  Check the number of grams (g) of saturated and trans fats in one serving. Choose foods that have low or no amount of these fats.  Check the number of milligrams (mg) of sodium in one serving. Most people   should limit total sodium intake to less than 2,300 mg per day.  Always check the nutrition information of foods labeled as "low-fat" or "nonfat". These foods may be higher in added sugar or refined carbohydrates and should be avoided.  Talk to your dietitian to identify your daily goals for nutrients listed on the label. Shopping  Avoid buying canned, premade, or processed foods. These  foods tend to be high in fat, sodium, and added sugar.  Shop around the outside edge of the grocery store. This includes fresh fruits and vegetables, bulk grains, fresh meats, and fresh dairy. Cooking  Use low-heat cooking methods, such as baking, instead of high-heat cooking methods like deep frying.  Cook using healthy oils, such as olive, canola, or sunflower oil.  Avoid cooking with butter, cream, or high-fat meats. Meal planning  Eat meals and snacks regularly, preferably at the same times every day. Avoid going long periods of time without eating.  Eat foods high in fiber, such as fresh fruits, vegetables, beans, and whole grains. Talk to your dietitian about how many servings of carbohydrates you can eat at each meal.  Eat 4-6 ounces of lean protein each day, such as lean meat, chicken, fish, eggs, or tofu. 1 ounce is equal to 1 ounce of meat, chicken, or fish, 1 egg, or 1/4 cup of tofu.  Eat some foods each day that contain healthy fats, such as avocado, nuts, seeds, and fish. Lifestyle   Check your blood glucose regularly.  Exercise at least 30 minutes 5 or more days each week, or as told by your health care provider.  Take medicines as told by your health care provider.  Do not use any products that contain nicotine or tobacco, such as cigarettes and e-cigarettes. If you need help quitting, ask your health care provider.  Work with a counselor or diabetes educator to identify strategies to manage stress and any emotional and social challenges. What are some questions to ask my health care provider?  Do I need to meet with a diabetes educator?  Do I need to meet with a dietitian?  What number can I call if I have questions?  When are the best times to check my blood glucose? Where to find more information:  American Diabetes Association: diabetes.org/food-and-fitness/food  Academy of Nutrition and Dietetics:  www.eatright.org/resources/health/diseases-and-conditions/diabetes  National Institute of Diabetes and Digestive and Kidney Diseases (NIH): www.niddk.nih.gov/health-information/diabetes/overview/diet-eating-physical-activity Summary  A healthy meal plan will help you control your blood glucose and maintain a healthy lifestyle.  Working with a diet and nutrition specialist (dietitian) can help you make a meal plan that is best for you.  Keep in mind that carbohydrates and alcohol have immediate effects on your blood glucose levels. It is important to count carbohydrates and to use alcohol carefully. This information is not intended to replace advice given to you by your health care provider. Make sure you discuss any questions you have with your health care provider. Document Released: 06/24/2005 Document Revised: 11/01/2016 Document Reviewed: 11/01/2016 Elsevier Interactive Patient Education  2018 [employer]  

## 2018-05-31 NOTE — Progress Notes (Signed)
Subjective:    Patient ID: Francisco Jackson, male    DOB: 1976/04/14, 42 y.o.   MRN: [id number]  HPI   Patient presents to clinic to discuss elevated blood sugars.  Has been monitoring his sugars over the past 4 weeks more closely and has noticed he has been running [place], even had a few occasions of 400 readings and one occasion of 600.  He currently takes metformin 500 mg twice daily.  Patient states he went to the diabetes education class, but it was a group meeting and he really had a hard time getting/retaining good information.  He would prefer going to a one-on-one nutrition education class.  States he tries to make healthy food choices but often gets overwhelmed with different diet recommendations and is unsure of what to eat.  Wife goes to endocrinologist for her thyroid issues, and would like her husband also to see an endocrinologist for his diabetes.  Blood pressure has been stable on amlodipine, takes Crestor for cholesterol control.  Lab Results  Component Value Date   HGBA1C 8.4 01/25/2018   HGBA1C 6.7 08/09/2017   HGBA1C 6.5 12/22/2016   Lab Results  Component Value Date   MICROALBUR 138.8 12/22/2016   LDLCALC 75 01/25/2018   CREATININE 1.1 01/25/2018    Patient Active Problem List   Diagnosis Date Noted  . Witnessed apneic spells 01/24/2018  . Pneumonia 01/11/2018  . Abnormal liver function tests 08/15/2017  . Erectile dysfunction 06/20/2017  . Chest pain 12/20/2016  . Stress 10/24/2016  . Essential hypertension 10/24/2016  . Diabetes mellitus ([place]) 08/01/2016  . Hypercholesterolemia 08/01/2016  . Frequent episodes of bronchitis 08/01/2016   Social History   Tobacco Use  . Smoking status: Former Smoker    Packs/day: 0.50    Years: 10.00    Pack years: 5.00    Types: Cigarettes    Last attempt to quit: 10/11/2002    Years since quitting: 15.6  . Smokeless tobacco: Former Systems developer    Types: Snuff    Quit date: 10/11/2009  Substance Use Topics  .  Alcohol use: Yes    Alcohol/week: 4.0 standard drinks    Types: 4 Glasses of wine per week    Comment: ocassional    Review of Systems  Constitutional: Negative for chills, fatigue and fever.  HENT: Negative for congestion, ear pain, sinus pain and sore throat.   Eyes: Negative.   Respiratory: Negative for cough, shortness of breath and wheezing.   Cardiovascular: Negative for chest pain, palpitations and leg swelling.  Gastrointestinal: Negative for abdominal pain, diarrhea, nausea and vomiting.  Genitourinary: Negative for dysuria, frequency and urgency.  Musculoskeletal: Negative for arthralgias and myalgias.  Skin: Negative for color change, pallor and rash.  Neurological: Negative for syncope, light-headedness and headaches.  Psychiatric/Behavioral: The patient is not nervous/anxious.       Objective:   Physical Exam  Constitutional: He appears well-developed and well-nourished. No distress.  Head: Normocephalic and atraumatic.  Eyes: EOM are normal. No scleral icterus.  Neck: Normal range of motion. Neck supple. No tracheal deviation present.  Cardiovascular: Normal rate, regular rhythm and normal heart sounds.  Pulmonary/Chest: Effort normal and breath sounds normal. No respiratory distress. He has no wheezes. He has no rales.   Neurological: He is alert and oriented to person, place, and time. Gait normal.  Skin: Skin is warm and dry. He is not diaphoretic. No pallor.  Psychiatric: He has a normal mood and affect. His behavior is normal.  Nursing note and vitals reviewed.  Vitals:   05/31/18 1137  BP: (!) 128/98  Pulse: 73  Temp: 98.5 F (36.9 C)  SpO2: 96%      Assessment & Plan:   Type 2 DM/elevated sugar readings --new A1c drawn in clinic.  Patient will increase metformin to 1000 mg twice daily.  We also discussed different medication options to add on top of metformin and patient mother has had good success with Trulicity.  We also discussed for farxiga,  but he prefers the once weekly dosing of Trulicity.  We will start on the lower Trulicity dose of 3.47 once weekly, and we can increase the dose from there accordingly.  Referral to endocrinology given.  Patient aware that I will talk with our referral coordinator to see different options for meeting with a nutritionist.  Patient also given one of my books about healthy food choices -discussed the best types of diets for diabetes/blood pressure/cholesterol patient's tend to be lower carb, high lean protein, lots of vegetables and good portion control.  HTN --stable on amlodipine  Hypercholesterolemia --- still on Crestor  Follow-up in 4 to 6 weeks to see how blood sugars are doing with addition of Trulicity.  Patient aware that someone will be contacting him in regards to his endocrinology referral.

## 2018-06-05 DIAGNOSIS — E1165 Type 2 diabetes mellitus with hyperglycemia: Secondary | ICD-10-CM

## 2018-06-06 ENCOUNTER — Telehealth: Payer: Self-pay

## 2018-06-06 NOTE — Telephone Encounter (Signed)
Nutrition referral is in  And yes his wife did mention Dr Beverlyn Roux was leaving and did not know the name of her replacement - they would still like to go to that office  Thanks! LG

## 2018-06-06 NOTE — Telephone Encounter (Signed)
Sometimes changing to extended release metformin will help with diarrhea.  If willing, can try to change to extended release metformin 500mg  (with instructions as outlined in previous note).  Agree with endocrinology referral as outlined in [facility] note.  It appears also started trulicity.  Follow sugars.  If persistent problems with diarrhea, may need to decrease back down and titrate up trulicity, etc.

## 2018-06-06 NOTE — Telephone Encounter (Signed)
Stomach upset can be a common side effect from metformin dose increases, usually improves after 2-3 weeks or so on the medication. I would suggest taking an OTC pepto bismol tablet as needed to calm symptoms. If they are not improving, we can back down on dose to 1500mg  total per day instead of 2000 total per day.

## 2018-06-06 NOTE — Telephone Encounter (Signed)
Called patient to give him Lauren's recommendations, patient states that she wanted to talk to Dr. Nicki Reaper about it. He states that it is more than just needing pepto for an upset stomach and that he has lost at least 10 pounds.

## 2018-06-06 NOTE — Telephone Encounter (Signed)
See last unrouted message

## 2018-06-06 NOTE — Telephone Encounter (Signed)
Copied from [facility] [id number]. Topic: General - Other >> Jun 06, 2018  8:45 AM Carolyn Stare wrote:  Pt saw Philis Nettle and she increased his  metFORMIN (GLUCOPHAGE) 1000 MG tablet  pt said the increase has caused him to have nausea and diarrh bad abd cramping . Would like a call back

## 2018-06-06 NOTE — Telephone Encounter (Signed)
Patient is requesting consultation from you about his recent medication changes. He has already been advised by Ander Purpura

## 2018-06-06 NOTE — Telephone Encounter (Signed)
Lauren, can you place a referral for a nutritionist-per pt please? Thanks! Melissa

## 2018-06-07 ENCOUNTER — Other Ambulatory Visit: Payer: Self-pay

## 2018-06-07 DIAGNOSIS — G4733 Obstructive sleep apnea (adult) (pediatric): Secondary | ICD-10-CM | POA: Diagnosis not present

## 2018-06-07 MED ORDER — METFORMIN HCL ER 500 MG PO TB24: 1000.0000 mg | ORAL_TABLET | Freq: Two times a day (BID) | ORAL | 1 refills | Status: DC

## 2018-06-07 NOTE — Telephone Encounter (Signed)
Patient is agreeable to endocrinology referral. He is also agreeable to switch to metformin extended release. Tolerating trulicity well. He does need a new script of metformin xr.  We are sending in metformin xr 500 mg 2 tabs BID, correct?

## 2018-06-07 NOTE — Telephone Encounter (Signed)
rx sent in 

## 2018-06-07 NOTE — Telephone Encounter (Signed)
Yes.  Let us know if persistent problems.

## 2018-06-08 ENCOUNTER — Other Ambulatory Visit: Payer: Self-pay | Admitting: Internal Medicine

## 2018-06-15 DIAGNOSIS — J3081 Allergic rhinitis due to animal (cat) (dog) hair and dander: Secondary | ICD-10-CM | POA: Diagnosis not present

## 2018-06-15 DIAGNOSIS — J3089 Other allergic rhinitis: Secondary | ICD-10-CM | POA: Diagnosis not present

## 2018-06-27 DIAGNOSIS — J3089 Other allergic rhinitis: Secondary | ICD-10-CM | POA: Diagnosis not present

## 2018-06-27 DIAGNOSIS — J3081 Allergic rhinitis due to animal (cat) (dog) hair and dander: Secondary | ICD-10-CM | POA: Diagnosis not present

## 2018-07-05 ENCOUNTER — Encounter: Payer: Self-pay | Admitting: Dietician

## 2018-07-05 ENCOUNTER — Encounter: Payer: BLUE CROSS/BLUE SHIELD | Attending: Internal Medicine | Admitting: Dietician

## 2018-07-05 VITALS — Ht 71.0 in | Wt 247.7 lb

## 2018-07-05 DIAGNOSIS — E119 Type 2 diabetes mellitus without complications: Secondary | ICD-10-CM

## 2018-07-05 NOTE — Patient Instructions (Signed)
   Gradually reduce portions of starches and foods in general. Eat generous portions of low-carb veggies and include some fruits.

## 2018-07-05 NOTE — Progress Notes (Signed)
Medical Nutrition Therapy: Visit start time: 1500  end time: 1600  Assessment:  Diagnosis: Type 2 Diabetes Past medical history: HTN, hyperlipidemia, sleep apnea Psychosocial issues/ stress concerns: Patient reports high stress level  Preferred learning method:  . Visual   Current weight: 247.7lbs Height: 5'11" Medications, supplements: reconciled list in medical record  Progress and evaluation: Patient reports diagnosis of diabetes about 2 years ago; BGs have recently increased but now improving since starting Trulicity. He came for some diabetes education 09/2015. Wife reports patient's dad died suddenely at age 49, and she and patient are concerned with his health now. Patient would like help with appropriate food choices for diabetes as well as for blood pressure and cholesterol control. Tests fasting BGs with results in 140s - 150s.    Physical activity: no structured acitivty  Dietary Intake:  Usual eating pattern includes 3 meals and 1-2 snacks per day. Dining out frequency: 10-11 meals per week.  Breakfast: 8-9am boiled eggs or cereal (usually), or sandwich with egg, sausage and cheese Snack: none Lunch: meat and veg., leftovers; salad; occasional pizza -- mostly out [place], [place], [place], [employer]: occasionally nuts, ice cream, occasional m&ms, pkg crackers whole grain Supper: same type meals at lunch; out often  Snack: sometimes ice cream, brownies-- small piece Beverages: diet coke, unsweetened tea, water, 1c coffee in am  Nutrition Care Education: Topics covered: diabetes, heart health Basic nutrition: basic food groups, appropriate nutrient balance, appropriate meal and snack schedule   Weight control: benefits of weight control, determining reasonable weight goal, portion control, low fat and low sugar food choices Advanced nutrition: dining out, food label reading Diabetes:  appropriate meal and snack schedule, appropriate carb intake and healthy carb choices,  healthy food portions, increasing vegetables and fruits for fiber and low calorie content.  Hypertension:  identifying food sources of potassium, magnesium Hyperlipidemia: healthy and unhealthy fats, role of fiber, food sources of phytochemicals Other lifestyle changes: readiness for change, making changes gradually and a few changes at a time.   Nutritional Diagnosis:  [place]-2.2 Altered nutrition-related laboratory As related to diabetes.  As evidenced by recent HbA1C of 9.2% and patient report of home blood glucose test results. [place]-3.3 Overweight/obesity As related to excess calories, inactivity.  As evidenced by patient with BMI of 34.5, and patient report of dietary intake.  Intervention: Instruction as noted above.   Patient voices struggle with being able to limit food portions to recommended levels; encouraged gradual changes and limiting to 2-3 changes at a time.    Set goals with direction from patient.  Education Materials given:  . Plate Planner with food lists . Dining out resource . Goals/ instructions   Learner/ who was taught:  . Patient  . Spouse/ partner   Level of understanding: [place] Kitchen Verbalizes/ demonstrates competency   Demonstrated degree of understanding via:   Teach back Learning barriers: . None   Willingness to learn/ readiness for change: . Acceptance, ready for some change   Monitoring and Evaluation:  Dietary intake, exercise, BG control, and body weight      follow up: 08/16/18

## 2018-07-08 DIAGNOSIS — G4733 Obstructive sleep apnea (adult) (pediatric): Secondary | ICD-10-CM | POA: Diagnosis not present

## 2018-07-17 DIAGNOSIS — E119 Type 2 diabetes mellitus without complications: Secondary | ICD-10-CM | POA: Diagnosis not present

## 2018-07-17 DIAGNOSIS — F413 Other mixed anxiety disorders: Secondary | ICD-10-CM | POA: Insufficient documentation

## 2018-07-17 DIAGNOSIS — Z7189 Other specified counseling: Secondary | ICD-10-CM | POA: Diagnosis not present

## 2018-07-20 DIAGNOSIS — E119 Type 2 diabetes mellitus without complications: Secondary | ICD-10-CM | POA: Diagnosis not present

## 2018-08-07 DIAGNOSIS — G4733 Obstructive sleep apnea (adult) (pediatric): Secondary | ICD-10-CM | POA: Diagnosis not present

## 2018-08-09 DIAGNOSIS — R7989 Other specified abnormal findings of blood chemistry: Secondary | ICD-10-CM | POA: Diagnosis not present

## 2018-08-10 ENCOUNTER — Other Ambulatory Visit: Payer: Self-pay | Admitting: Internal Medicine

## 2018-08-14 ENCOUNTER — Other Ambulatory Visit: Payer: Self-pay | Admitting: Internal Medicine

## 2018-08-14 DIAGNOSIS — I10 Essential (primary) hypertension: Secondary | ICD-10-CM | POA: Diagnosis not present

## 2018-08-14 DIAGNOSIS — E349 Endocrine disorder, unspecified: Secondary | ICD-10-CM | POA: Diagnosis not present

## 2018-08-14 DIAGNOSIS — E119 Type 2 diabetes mellitus without complications: Secondary | ICD-10-CM | POA: Diagnosis not present

## 2018-08-14 DIAGNOSIS — E1165 Type 2 diabetes mellitus with hyperglycemia: Secondary | ICD-10-CM | POA: Diagnosis not present

## 2018-08-14 DIAGNOSIS — Z23 Encounter for immunization: Secondary | ICD-10-CM | POA: Diagnosis not present

## 2018-08-14 DIAGNOSIS — E782 Mixed hyperlipidemia: Secondary | ICD-10-CM | POA: Diagnosis not present

## 2018-08-14 DIAGNOSIS — Z87891 Personal history of nicotine dependence: Secondary | ICD-10-CM | POA: Diagnosis not present

## 2018-08-16 ENCOUNTER — Ambulatory Visit: Payer: BLUE CROSS/BLUE SHIELD | Admitting: Dietician

## 2018-09-07 DIAGNOSIS — G4733 Obstructive sleep apnea (adult) (pediatric): Secondary | ICD-10-CM | POA: Diagnosis not present

## 2018-09-20 ENCOUNTER — Encounter: Payer: Self-pay | Admitting: Dietician

## 2018-09-20 NOTE — Progress Notes (Signed)
Have not heard back from patient to reschedule his missed appointment from 08/16/18. Sent letter to referring provider.

## 2018-09-25 NOTE — Progress Notes (Signed)
He does not have any future follow up scheduled -- please see if he can come in for office visit for regular diabetes follow up

## 2018-10-05 ENCOUNTER — Ambulatory Visit: Payer: Self-pay | Admitting: *Deleted

## 2018-10-05 ENCOUNTER — Other Ambulatory Visit: Payer: Self-pay | Admitting: Internal Medicine

## 2018-10-05 MED ORDER — OSELTAMIVIR PHOSPHATE 75 MG PO CAPS: 75.0000 mg | ORAL_CAPSULE | Freq: Every day | ORAL | 0 refills | Status: DC

## 2018-10-05 NOTE — Telephone Encounter (Signed)
Spoke with pt to let him know that Dr. Caryl Bis sent in the tamiflu and also advised him of the complications that can occur from taking tamiflu. Pt gave a verbal understanding.

## 2018-10-05 NOTE — Telephone Encounter (Signed)
Noted.  I have sent Tamiflu to the patient's pharmacy.  His uncontrolled diabetes does place him at risk for complications from influenza.  He should be aware that the Tamiflu may be expensive.  He should also be aware that there is a risk of psychiatric issues with delirium, confusion, and hallucinations occurring.  If those things occur he needs to be evaluated.  If he develops flu symptoms he needs to contact us.  Thanks.

## 2018-10-05 NOTE — Telephone Encounter (Signed)
Pt's wife, Nevin Bloodgood calling to see if the pt could be given a prescription for Tamiflu. Pt's wife states that both of their children tested positive for the flu today. Pt does not currently have symptoms. Pt's wife states her PCP gave her a prescription for Tamiflu for prevention and it was recommended that the pt also obtain a prescription. Pt's wife states the pt is at work currently and may not be able to answer the phone. Pt's wife states she could be called back at [phone]. If prescription is called in she would like it sent to  [place], [place] - [place] [phone] (Phone) [phone] (Fax)  Reason for Disposition . [1] Influenza EXPOSURE within last 48 hours (2 days) AND [2] NOT HIGH RISK AND [3] strongly requests antiviral medication  Answer Assessment - Initial Assessment Questions 1. TYPE of EXPOSURE: "How were you exposed?" (e.g., close contact, not a close contact)     Both children tested positive for the Flu today 2. DATE of EXPOSURE: "When did the exposure occur?" (e.g., hour, days, weeks)     Flu was confirmed today and symptoms started on yesterday 3. SYMPTOMS: "Do you have any symptoms?" (e.g., cough, fever, sore throat, difficulty breathing).     Pt does not have any Flu symptoms currently  Protocols used: INFLUENZA EXPOSURE-A-AH

## 2018-10-07 DIAGNOSIS — G4733 Obstructive sleep apnea (adult) (pediatric): Secondary | ICD-10-CM | POA: Diagnosis not present

## 2018-11-08 ENCOUNTER — Other Ambulatory Visit: Payer: Self-pay | Admitting: Internal Medicine

## 2018-11-08 DIAGNOSIS — J3089 Other allergic rhinitis: Secondary | ICD-10-CM

## 2018-11-09 DIAGNOSIS — H1045 Other chronic allergic conjunctivitis: Secondary | ICD-10-CM | POA: Diagnosis not present

## 2018-11-09 DIAGNOSIS — J3081 Allergic rhinitis due to animal (cat) (dog) hair and dander: Secondary | ICD-10-CM | POA: Diagnosis not present

## 2018-11-09 DIAGNOSIS — J3089 Other allergic rhinitis: Secondary | ICD-10-CM | POA: Diagnosis not present

## 2018-11-09 DIAGNOSIS — J4530 Mild persistent asthma, uncomplicated: Secondary | ICD-10-CM | POA: Diagnosis not present

## 2018-11-10 DIAGNOSIS — J3089 Other allergic rhinitis: Secondary | ICD-10-CM | POA: Diagnosis not present

## 2018-11-10 DIAGNOSIS — J3081 Allergic rhinitis due to animal (cat) (dog) hair and dander: Secondary | ICD-10-CM | POA: Diagnosis not present

## 2018-11-20 DIAGNOSIS — Z833 Family history of diabetes mellitus: Secondary | ICD-10-CM | POA: Diagnosis not present

## 2018-11-20 DIAGNOSIS — Z79899 Other long term (current) drug therapy: Secondary | ICD-10-CM | POA: Diagnosis not present

## 2018-11-20 DIAGNOSIS — I10 Essential (primary) hypertension: Secondary | ICD-10-CM | POA: Diagnosis not present

## 2018-11-20 DIAGNOSIS — Z87891 Personal history of nicotine dependence: Secondary | ICD-10-CM | POA: Diagnosis not present

## 2018-11-20 DIAGNOSIS — R6882 Decreased libido: Secondary | ICD-10-CM | POA: Diagnosis not present

## 2018-11-20 DIAGNOSIS — E119 Type 2 diabetes mellitus without complications: Secondary | ICD-10-CM | POA: Diagnosis not present

## 2018-11-20 DIAGNOSIS — Z7984 Long term (current) use of oral hypoglycemic drugs: Secondary | ICD-10-CM | POA: Diagnosis not present

## 2018-11-20 DIAGNOSIS — R5383 Other fatigue: Secondary | ICD-10-CM | POA: Diagnosis not present

## 2018-11-20 DIAGNOSIS — E782 Mixed hyperlipidemia: Secondary | ICD-10-CM | POA: Diagnosis not present

## 2018-11-20 DIAGNOSIS — Z713 Dietary counseling and surveillance: Secondary | ICD-10-CM | POA: Diagnosis not present

## 2018-11-20 DIAGNOSIS — Z6835 Body mass index (BMI) 35.0-35.9, adult: Secondary | ICD-10-CM | POA: Diagnosis not present

## 2018-11-20 DIAGNOSIS — Z79890 Hormone replacement therapy (postmenopausal): Secondary | ICD-10-CM | POA: Diagnosis not present

## 2018-11-20 DIAGNOSIS — E669 Obesity, unspecified: Secondary | ICD-10-CM | POA: Diagnosis not present

## 2018-11-20 DIAGNOSIS — G4733 Obstructive sleep apnea (adult) (pediatric): Secondary | ICD-10-CM | POA: Diagnosis not present

## 2018-11-20 DIAGNOSIS — E291 Testicular hypofunction: Secondary | ICD-10-CM | POA: Diagnosis not present

## 2018-11-24 DIAGNOSIS — J3089 Other allergic rhinitis: Secondary | ICD-10-CM | POA: Diagnosis not present

## 2018-11-24 DIAGNOSIS — J3081 Allergic rhinitis due to animal (cat) (dog) hair and dander: Secondary | ICD-10-CM | POA: Diagnosis not present

## 2018-11-30 DIAGNOSIS — G4733 Obstructive sleep apnea (adult) (pediatric): Secondary | ICD-10-CM | POA: Diagnosis not present

## 2018-12-05 ENCOUNTER — Encounter: Payer: Self-pay | Admitting: Internal Medicine

## 2018-12-06 ENCOUNTER — Other Ambulatory Visit: Payer: Self-pay | Admitting: Internal Medicine

## 2018-12-28 DIAGNOSIS — G4733 Obstructive sleep apnea (adult) (pediatric): Secondary | ICD-10-CM | POA: Diagnosis not present

## 2019-01-01 ENCOUNTER — Other Ambulatory Visit: Payer: Self-pay | Admitting: Internal Medicine

## 2019-02-02 ENCOUNTER — Other Ambulatory Visit: Payer: Self-pay | Admitting: Internal Medicine

## 2019-02-02 DIAGNOSIS — J3089 Other allergic rhinitis: Secondary | ICD-10-CM

## 2019-02-09 DIAGNOSIS — R51 Headache: Secondary | ICD-10-CM | POA: Diagnosis not present

## 2019-02-09 DIAGNOSIS — M5414 Radiculopathy, thoracic region: Secondary | ICD-10-CM | POA: Diagnosis not present

## 2019-02-09 DIAGNOSIS — M9901 Segmental and somatic dysfunction of cervical region: Secondary | ICD-10-CM | POA: Diagnosis not present

## 2019-02-09 DIAGNOSIS — M9902 Segmental and somatic dysfunction of thoracic region: Secondary | ICD-10-CM | POA: Diagnosis not present

## 2019-02-21 DIAGNOSIS — M9902 Segmental and somatic dysfunction of thoracic region: Secondary | ICD-10-CM | POA: Diagnosis not present

## 2019-02-21 DIAGNOSIS — M5414 Radiculopathy, thoracic region: Secondary | ICD-10-CM | POA: Diagnosis not present

## 2019-02-21 DIAGNOSIS — M9901 Segmental and somatic dysfunction of cervical region: Secondary | ICD-10-CM | POA: Diagnosis not present

## 2019-02-21 DIAGNOSIS — H1031 Unspecified acute conjunctivitis, right eye: Secondary | ICD-10-CM | POA: Diagnosis not present

## 2019-02-21 DIAGNOSIS — R51 Headache: Secondary | ICD-10-CM | POA: Diagnosis not present

## 2019-02-28 DIAGNOSIS — G4733 Obstructive sleep apnea (adult) (pediatric): Secondary | ICD-10-CM | POA: Diagnosis not present

## 2019-03-05 ENCOUNTER — Other Ambulatory Visit: Payer: Self-pay | Admitting: Internal Medicine

## 2019-03-13 NOTE — Telephone Encounter (Signed)
LM to call and schedule f/u appt in order to get refills

## 2019-04-05 ENCOUNTER — Other Ambulatory Visit: Payer: Self-pay | Admitting: Internal Medicine

## 2019-05-04 ENCOUNTER — Other Ambulatory Visit: Payer: Self-pay | Admitting: Internal Medicine

## 2019-05-07 NOTE — Telephone Encounter (Signed)
Needs f/u appt (ok for doxy).  Once scheduled, ok to refill until appt.

## 2019-05-28 ENCOUNTER — Other Ambulatory Visit: Payer: Self-pay

## 2019-05-30 ENCOUNTER — Encounter: Payer: Self-pay | Admitting: Internal Medicine

## 2019-05-30 ENCOUNTER — Other Ambulatory Visit: Payer: Self-pay

## 2019-05-30 ENCOUNTER — Ambulatory Visit (INDEPENDENT_AMBULATORY_CARE_PROVIDER_SITE_OTHER): Payer: 59 | Admitting: Internal Medicine

## 2019-05-30 VITALS — BP 126/82 | HR 73 | Temp 98.3°F | Resp 16 | Ht 71.0 in | Wt 241.0 lb

## 2019-05-30 DIAGNOSIS — E119 Type 2 diabetes mellitus without complications: Secondary | ICD-10-CM

## 2019-05-30 DIAGNOSIS — H524 Presbyopia: Secondary | ICD-10-CM | POA: Diagnosis not present

## 2019-05-30 DIAGNOSIS — Z6833 Body mass index (BMI) 33.0-33.9, adult: Secondary | ICD-10-CM

## 2019-05-30 DIAGNOSIS — R945 Abnormal results of liver function studies: Secondary | ICD-10-CM

## 2019-05-30 DIAGNOSIS — I10 Essential (primary) hypertension: Secondary | ICD-10-CM

## 2019-05-30 DIAGNOSIS — Z0000 Encounter for general adult medical examination without abnormal findings: Secondary | ICD-10-CM

## 2019-05-30 DIAGNOSIS — J181 Lobar pneumonia, unspecified organism: Secondary | ICD-10-CM

## 2019-05-30 DIAGNOSIS — R7989 Other specified abnormal findings of blood chemistry: Secondary | ICD-10-CM

## 2019-05-30 DIAGNOSIS — E7800 Pure hypercholesterolemia, unspecified: Secondary | ICD-10-CM

## 2019-05-30 DIAGNOSIS — J189 Pneumonia, unspecified organism: Secondary | ICD-10-CM

## 2019-05-30 DIAGNOSIS — R221 Localized swelling, mass and lump, neck: Secondary | ICD-10-CM

## 2019-05-30 DIAGNOSIS — Z23 Encounter for immunization: Secondary | ICD-10-CM | POA: Diagnosis not present

## 2019-05-30 DIAGNOSIS — R69 Illness, unspecified: Secondary | ICD-10-CM | POA: Diagnosis not present

## 2019-05-30 DIAGNOSIS — F439 Reaction to severe stress, unspecified: Secondary | ICD-10-CM

## 2019-05-30 LAB — HM DIABETES EYE EXAM

## 2019-05-30 MED ORDER — SILDENAFIL CITRATE 20 MG PO TABS: ORAL_TABLET | ORAL | 1 refills | Status: DC

## 2019-05-30 NOTE — Progress Notes (Addendum)
Patient ID: Francisco Jackson, male   DOB: December 17, 1975, 43 y.o.   MRN: [id number]   Subjective:    Patient ID: Francisco Jackson, male    DOB: Oct 31, 1975, 43 y.o.   MRN: [id number]  HPI  Patient here for a physical exam.  Reports he is doing well.  Feels good.  Trying to watch his diet.  Has lost weight.  States sugars improved.  Brought in no recorded sugar readings.  No chest pain.  No sob.  No acid reflux.  No abdominal pain.  Bowels moving.  Stress is better.  Blood pressure doing well.  Overall feels better.  Overdue labs.     Past Medical History:  Diagnosis Date  . Asthma   . Diabetes mellitus without complication ([place])   . Hypertension   . Personality disorder ([place])    borderline   Past Surgical History:  Procedure Laterality Date  . ANTERIOR CRUCIATE LIGAMENT REPAIR Right 2006  . [place] SURGERY  2010  . MENISCUS REPAIR Left 1996   Family History  Problem Relation Age of Onset  . Diabetes Mother   . Melanoma Mother   . Diabetes Paternal Aunt   . Heart attack Paternal Aunt   . Diabetes Paternal Uncle   . Diabetes Maternal Grandmother   . Diabetes Maternal Grandfather   . Heart disease Maternal Grandfather   . Heart attack Maternal Grandfather   . Heart attack Father   . Hypertension Father   . Diabetes Sister   . Hypertension Sister   . Kidney cancer Neg Hx   . Kidney disease Neg Hx   . Prostate cancer Neg Hx    Social History   Socioeconomic History  . Marital status: Married    Spouse name: Not on file  . Number of children: Not on file  . Years of education: Not on file  . Highest education level: Not on file  Occupational History  . Not on file  Social Needs  . Financial resource strain: Not on file  . Food insecurity    Worry: Not on file    Inability: Not on file  . Transportation needs    Medical: Not on file    Non-medical: Not on file  Tobacco Use  . Smoking status: Former Smoker    Packs/day: 0.50    Years: 10.00    Pack years:  5.00    Types: Cigarettes    Quit date: 10/11/2002    Years since quitting: 16.6  . Smokeless tobacco: Former Systems developer    Types: Snuff    Quit date: 10/11/2009  Substance and Sexual Activity  . Alcohol use: Yes    Alcohol/week: 4.0 standard drinks    Types: 4 Glasses of wine per week    Comment: ocassional  . Drug use: No  . Sexual activity: Not on file  Lifestyle  . Physical activity    Days per week: Not on file    Minutes per session: Not on file  . Stress: Not on file  Relationships  . Social Herbalist on phone: Not on file    Gets together: Not on file    Attends religious service: Not on file    Active member of club or organization: Not on file    Attends meetings of clubs or organizations: Not on file    Relationship status: Not on file  Other Topics Concern  . Not on file  Social History Narrative  . Not on  file    Outpatient Encounter Medications as of 05/30/2019  Medication Sig  . metFORMIN (GLUCOPHAGE-XR) 500 MG 24 hr tablet Take 1,000 mg by mouth 2 (two) times daily.  Marland Kitchen albuterol (PROVENTIL HFA;VENTOLIN HFA) 108 (90 BASE) MCG/ACT inhaler Inhale 1-2 puffs into the lungs every 6 (six) hours as needed.  Marland Kitchen amLODipine (NORVASC) 10 MG tablet TAKE ONE TABLET EVERY DAY  . azelastine (ASTELIN) 0.1 % nasal spray   . budesonide-formoterol (SYMBICORT) 160-4.5 MCG/ACT inhaler Inhale 2 puffs into the lungs 2 (two) times daily.  Marland Kitchen FLOVENT HFA 110 MCG/ACT inhaler INHALE 2 PUFFS INTO THE LUNGS TWICE DAILY  . fluticasone (FLONASE) 50 MCG/ACT nasal spray USE 2 SPRAYS IN EACH NOSTRIL DAILY  . hydrochlorothiazide (MICROZIDE) 12.5 MG capsule TAKE 1 CAPSULE BY MOUTH ONCE DAILY  . Insulin Pen Needle (PEN NEEDLES) 30G X 5 MM MISC Inject 1 Applicatorful into the skin once a week. Use with trulicity pen  . levocetirizine (XYZAL) 5 MG tablet   . montelukast (SINGULAIR) 10 MG tablet TAKE ONE TABLET BY MOUTH EVERY MORNING  . rosuvastatin (CRESTOR) 5 MG tablet TAKE ONE TABLET EVERY DAY   . sertraline (ZOLOFT) 100 MG tablet TAKE ONE TABLET BY MOUTH DAILY  . sildenafil (REVATIO) 20 MG tablet Take 2-4 tablets two hours before intercouse on an empty stomach.  Do not take with nitrates.  . TRULICITY 1.5 YQ/8.2NO SOPN   . [DISCONTINUED] Dulaglutide (TRULICITY) 0.37 CW/8.8QB SOPN Inject 0.90m subcutaneously once every week  . [DISCONTINUED] metFORMIN (GLUCOPHAGE-XR) 500 MG 24 hr tablet TAKE ONE TABLET TWICE DAILY BEFORE A MEAL  . [DISCONTINUED] oseltamivir (TAMIFLU) 75 MG capsule Take 1 capsule (75 mg total) by mouth daily.  . [DISCONTINUED] sildenafil (REVATIO) 20 MG tablet Take 3 to 5 tablets two hours before intercouse on an empty stomach.  Do not take with nitrates.   No facility-administered encounter medications on file as of 05/30/2019.     Review of Systems  Constitutional: Negative for appetite change and unexpected weight change.  HENT: Negative for congestion and sinus pressure.   Eyes: Negative for pain and visual disturbance.  Respiratory: Negative for cough, chest tightness and shortness of breath.   Cardiovascular: Negative for chest pain, palpitations and leg swelling.  Gastrointestinal: Negative for abdominal pain, diarrhea, nausea and vomiting.  Genitourinary: Negative for difficulty urinating and dysuria.  Musculoskeletal: Negative for joint swelling and myalgias.  Skin: Negative for color change and rash.  Neurological: Negative for dizziness, light-headedness and headaches.  Hematological: Negative for adenopathy. Does not bruise/bleed easily.  Psychiatric/Behavioral: Negative for agitation and dysphoric mood.       Objective:    Physical Exam Nursing note reviewed.  Constitutional:      General: He is not in acute distress.    Appearance: Normal appearance. He is well-developed.  HENT:     Head: Normocephalic and atraumatic.     Right Ear: External ear normal.     Left Ear: External ear normal.  Eyes:     General: No scleral icterus.        Right eye: No discharge.        Left eye: No discharge.     Conjunctiva/sclera: Conjunctivae normal.  Neck:     Musculoskeletal: Neck supple. No muscular tenderness.     Thyroid: No thyromegaly.     Comments: Left neck fullness.   Cardiovascular:     Rate and Rhythm: Normal rate and regular rhythm.  Pulmonary:     Effort: No respiratory distress.  Breath sounds: Normal breath sounds. No wheezing.  Abdominal:     General: Bowel sounds are normal.     Palpations: Abdomen is soft.     Tenderness: There is no abdominal tenderness.  Genitourinary:    Comments: Not performed.  Musculoskeletal:        General: No tenderness.  Lymphadenopathy:     Cervical: No cervical adenopathy.  Skin:    Findings: No erythema or rash.  Neurological:     Mental Status: He is alert and oriented to person, place, and time.  Psychiatric:        Mood and Affect: Mood normal.        Behavior: Behavior normal.     BP 126/82   Pulse 73   Temp 98.3 F (36.8 C) (Oral)   Resp 16   Ht _0  (1.803 m)   Wt 241 lb (109.3 kg)   SpO2 97%   BMI 33.61 kg/m  Wt Readings from Last 3 Encounters:  05/30/19 241 lb (109.3 kg)  07/05/18 247 lb 11.2 oz (112.4 kg)  05/31/18 250 lb (113.4 kg)     Lab Results  Component Value Date   WBC 13.9 01/25/2018   HGB 16.5 01/25/2018   HCT 47 01/25/2018   PLT 255 01/25/2018   GLUCOSE 198 (H) 10/26/2016   CHOL 150 01/25/2018   TRIG 158 01/25/2018   HDL 43 01/25/2018   LDLCALC 75 01/25/2018   ALT 44 (A) 01/25/2018   AST 20 01/25/2018   NA 139 08/09/2017   K 4.5 08/09/2017   CL 104 10/26/2016   CREATININE 1.1 01/25/2018   BUN 23 (A) 01/25/2018   CO2 24 10/26/2016   TSH 2.71 01/25/2018   HGBA1C 9.2 (H) 05/31/2018   MICROALBUR 138.8 12/22/2016    Dg Chest 2 View  Result Date: 01/09/2018 CLINICAL DATA:  Cough, congestion, fever for 1 month EXAM: CHEST - 2 VIEW COMPARISON:  10/26/2016 FINDINGS: Patchy airspace disease at the left base. Right lung is clear.  Upper lungs clear. Normal heart size. No pneumothorax. IMPRESSION: Patchy airspace disease at the left base. Followup PA and lateral chest X-ray is recommended in 3-4 weeks following trial of antibiotic therapy to ensure resolution and exclude underlying malignancy. Electronically Signed   By: Marybelle Killings M.D.   On: 01/09/2018 15:03       Assessment & Plan:   Problem List Items Addressed This Visit    Abnormal liver function tests    Continue diet and exercise.  Has lost weight.  Follow liver function tests.        BMI 33.0-33.9,adult    Has lost weight.  Continue low carb diet and exercise.  Follow.        Diabetes mellitus ([place])    Low carb diet and exercise.  Has lost weight.  Follow met b and a1c.        Relevant Medications   TRULICITY 1.5 [place]/7.0WC SOPN   metFORMIN (GLUCOPHAGE-XR) 500 MG 24 hr tablet   Essential hypertension    Blood pressure doing well. Continue current medication regimen.  Follow pressures.  Follow metabolic panel.       Relevant Medications   sildenafil (REVATIO) 20 MG tablet   Hypercholesterolemia    Low cholesterol diet and exercise.  Follow lipid panel.        Relevant Medications   sildenafil (REVATIO) 20 MG tablet   Neck fullness    Left neck fullness on exam.  Schedule thyroid ultrasound.  Relevant Orders   US THYROID   Pneumonia    Previous diagnosis of pneumonia.  Needs f/u cxr.  Schedule.        Relevant Orders   DG Chest 2 View   Stress    On zoloft.  Doing well.  Better.  Follow.         Other Visit Diagnoses    Routine general medical examination at a health care facility    -  Primary   Need for immunization against influenza       Relevant Orders   Flu Vaccine QUAD 36+ mos IM (Completed)       Einar Pheasant, MD

## 2019-06-01 ENCOUNTER — Telehealth: Payer: Self-pay | Admitting: *Deleted

## 2019-06-01 DIAGNOSIS — R7989 Other specified abnormal findings of blood chemistry: Secondary | ICD-10-CM

## 2019-06-01 DIAGNOSIS — E7800 Pure hypercholesterolemia, unspecified: Secondary | ICD-10-CM

## 2019-06-01 DIAGNOSIS — Z125 Encounter for screening for malignant neoplasm of prostate: Secondary | ICD-10-CM

## 2019-06-01 DIAGNOSIS — R945 Abnormal results of liver function studies: Secondary | ICD-10-CM

## 2019-06-01 DIAGNOSIS — E119 Type 2 diabetes mellitus without complications: Secondary | ICD-10-CM

## 2019-06-01 DIAGNOSIS — I10 Essential (primary) hypertension: Secondary | ICD-10-CM

## 2019-06-01 NOTE — Telephone Encounter (Signed)
Please place future orders for lab appt.  

## 2019-06-02 NOTE — Telephone Encounter (Signed)
Order placed for labs.

## 2019-06-04 ENCOUNTER — Other Ambulatory Visit: Payer: Self-pay | Admitting: Internal Medicine

## 2019-06-04 ENCOUNTER — Encounter: Payer: Self-pay | Admitting: Internal Medicine

## 2019-06-04 DIAGNOSIS — Z6833 Body mass index (BMI) 33.0-33.9, adult: Secondary | ICD-10-CM | POA: Insufficient documentation

## 2019-06-04 DIAGNOSIS — J3089 Other allergic rhinitis: Secondary | ICD-10-CM

## 2019-06-04 DIAGNOSIS — R221 Localized swelling, mass and lump, neck: Secondary | ICD-10-CM | POA: Insufficient documentation

## 2019-06-04 NOTE — Assessment & Plan Note (Signed)
Low carb diet and exercise.  Has lost weight.  Follow met b and a1c.

## 2019-06-04 NOTE — Addendum Note (Signed)
Addended by: Alisa Graff on: 06/04/2019 05:37 AM   Modules accepted: Orders

## 2019-06-04 NOTE — Assessment & Plan Note (Signed)
On zoloft.  Doing well.  Better.  Follow.

## 2019-06-04 NOTE — Assessment & Plan Note (Signed)
Previous diagnosis of pneumonia.  Needs f/u cxr.  Schedule.

## 2019-06-04 NOTE — Assessment & Plan Note (Signed)
Low cholesterol diet and exercise.  Follow lipid panel.   

## 2019-06-04 NOTE — Assessment & Plan Note (Signed)
Continue diet and exercise.  Has lost weight.  Follow liver function tests.

## 2019-06-04 NOTE — Assessment & Plan Note (Signed)
Left neck fullness on exam.  Schedule thyroid ultrasound.

## 2019-06-04 NOTE — Assessment & Plan Note (Signed)
Has lost weight.  Continue low carb diet and exercise.  Follow.

## 2019-06-04 NOTE — Assessment & Plan Note (Signed)
Blood pressure doing well.  Continue current medication regimen.  Follow pressures.  Follow metabolic panel.  

## 2019-06-05 ENCOUNTER — Other Ambulatory Visit: Payer: 59

## 2019-06-05 ENCOUNTER — Other Ambulatory Visit: Payer: Self-pay

## 2019-06-05 ENCOUNTER — Ambulatory Visit: Payer: 59

## 2019-06-14 ENCOUNTER — Other Ambulatory Visit: Payer: Self-pay

## 2019-06-14 ENCOUNTER — Ambulatory Visit
Admission: RE | Admit: 2019-06-14 | Discharge: 2019-06-14 | Disposition: A | Discharge status: Home / Self Care | Payer: 59 | Source: Ambulatory Visit | Attending: Internal Medicine | Admitting: Internal Medicine

## 2019-06-14 DIAGNOSIS — E041 Nontoxic single thyroid nodule: Secondary | ICD-10-CM | POA: Diagnosis not present

## 2019-06-14 DIAGNOSIS — R221 Localized swelling, mass and lump, neck: Secondary | ICD-10-CM | POA: Diagnosis not present

## 2019-06-14 DIAGNOSIS — E119 Type 2 diabetes mellitus without complications: Secondary | ICD-10-CM

## 2019-06-14 NOTE — Progress Notes (Signed)
Sees Dr. Einar Pheasant, but they couldn't get him an appointment for his labs.  He's on spouse's [employer] insurance & she called requesting an appointment for labs only.  The orders for labs are in [place].  AMD

## 2019-06-16 LAB — CMP12+LP+TP+TSH+6AC+PSA+CBC…
ALT: 37 IU/L (ref 0–44)
AST: 24 IU/L (ref 0–40)
Albumin/Globulin Ratio: 2 (ref 1.2–2.2)
Albumin: 4.3 g/dL (ref 4.0–5.0)
Alkaline Phosphatase: 54 IU/L (ref 39–117)
BUN/Creatinine Ratio: 13 (ref 9–20)
BUN: 14 mg/dL (ref 6–24)
Basophils Absolute: 0.1 10*3/uL (ref 0.0–0.2)
Basos: 1 %
Bilirubin Total: 0.4 mg/dL (ref 0.0–1.2)
Calcium: 9.2 mg/dL (ref 8.7–10.2)
Chloride: 101 mmol/L (ref 96–106)
Chol/HDL Ratio: 3.7 ratio (ref 0.0–5.0)
Cholesterol, Total: 110 mg/dL (ref 100–199)
Creatinine, Ser: 1.05 mg/dL (ref 0.76–1.27)
EOS (ABSOLUTE): 0.4 10*3/uL (ref 0.0–0.4)
Eos: 5 %
Estimated CHD Risk: 0.6 times avg. (ref 0.0–1.0)
Free Thyroxine Index: 2 (ref 1.2–4.9)
GFR calc Af Amer: 99 mL/min/{1.73_m2} (ref >59)
GFR calc non Af Amer: 86 mL/min/{1.73_m2} (ref >59)
GGT: 33 IU/L (ref 0–65)
Globulin, Total: 2.1 g/dL (ref 1.5–4.5)
Glucose: 136 mg/dL — ABNORMAL HIGH (ref 65–99)
HDL: 30 mg/dL — ABNORMAL LOW (ref >39)
Hematocrit: 49.2 % (ref 37.5–51.0)
Hemoglobin: 17.1 g/dL (ref 13.0–17.7)
Immature Grans (Abs): 0 10*3/uL (ref 0.0–0.1)
Immature Granulocytes: 0 %
Iron: 83 ug/dL (ref 38–169)
LDH: 163 IU/L (ref 121–224)
LDL Chol Calc (NIH): 60 mg/dL (ref 0–99)
Lymphocytes Absolute: 2.8 10*3/uL (ref 0.7–3.1)
Lymphs: 37 %
MCH: 30.2 pg (ref 26.6–33.0)
MCHC: 34.8 g/dL (ref 31.5–35.7)
MCV: 87 fL (ref 79–97)
Monocytes Absolute: 0.6 10*3/uL (ref 0.1–0.9)
Monocytes: 8 %
Neutrophils Absolute: 3.8 10*3/uL (ref 1.4–7.0)
Neutrophils: 49 %
Phosphorus: 3.5 mg/dL (ref 2.8–4.1)
Platelets: 231 10*3/uL (ref 150–450)
Potassium: 4.1 mmol/L (ref 3.5–5.2)
Prostate Specific Ag, Serum: 0.4 ng/mL (ref 0.0–4.0)
RBC: 5.67 x10E6/uL (ref 4.14–5.80)
RDW: 12.9 % (ref 11.6–15.4)
Sodium: 137 mmol/L (ref 134–144)
T3 Uptake Ratio: 31 % (ref 24–39)
T4, Total: 6.4 ug/dL (ref 4.5–12.0)
TSH: 1.85 u[IU]/mL (ref 0.450–4.500)
Total Protein: 6.4 g/dL (ref 6.0–8.5)
Triglycerides: 104 mg/dL (ref 0–149)
Uric Acid: 5.3 mg/dL (ref 3.7–8.6)
VLDL Cholesterol Cal: 20 mg/dL (ref 5–40)
WBC: 7.6 10*3/uL (ref 3.4–10.8)

## 2019-06-16 LAB — MICROALBUMIN / CREATININE URINE RATIO
Creatinine, Urine: 342.3 mg/dL
Microalb/Creat Ratio: 4 mg/g creat (ref 0–29)
Microalbumin, Urine: 13.3 ug/mL

## 2019-06-16 LAB — HGB A1C W/O EAG: Hgb A1c MFr Bld: 8.3 % — ABNORMAL HIGH (ref 4.8–5.6)

## 2019-06-18 NOTE — Progress Notes (Signed)
Followed by Dr.Scott,  last physical 05/30/2019 with Dr. Nicki Reaper.

## 2019-06-20 ENCOUNTER — Ambulatory Visit: Payer: Self-pay | Admitting: *Deleted

## 2019-06-20 ENCOUNTER — Encounter: Payer: Self-pay | Admitting: Internal Medicine

## 2019-06-20 ENCOUNTER — Other Ambulatory Visit: Payer: Self-pay

## 2019-06-20 ENCOUNTER — Telehealth: Payer: Self-pay | Admitting: Internal Medicine

## 2019-06-20 ENCOUNTER — Ambulatory Visit (INDEPENDENT_AMBULATORY_CARE_PROVIDER_SITE_OTHER): Payer: 59 | Admitting: Internal Medicine

## 2019-06-20 DIAGNOSIS — F320 Major depressive disorder, single episode, mild: Secondary | ICD-10-CM

## 2019-06-20 DIAGNOSIS — R69 Illness, unspecified: Secondary | ICD-10-CM | POA: Diagnosis not present

## 2019-06-20 DIAGNOSIS — F439 Reaction to severe stress, unspecified: Secondary | ICD-10-CM | POA: Diagnosis not present

## 2019-06-20 DIAGNOSIS — R7989 Other specified abnormal findings of blood chemistry: Secondary | ICD-10-CM

## 2019-06-20 DIAGNOSIS — R945 Abnormal results of liver function studies: Secondary | ICD-10-CM

## 2019-06-20 DIAGNOSIS — E7800 Pure hypercholesterolemia, unspecified: Secondary | ICD-10-CM | POA: Diagnosis not present

## 2019-06-20 DIAGNOSIS — F1010 Alcohol abuse, uncomplicated: Secondary | ICD-10-CM

## 2019-06-20 DIAGNOSIS — F32A Depression, unspecified: Secondary | ICD-10-CM

## 2019-06-20 DIAGNOSIS — E1165 Type 2 diabetes mellitus with hyperglycemia: Secondary | ICD-10-CM

## 2019-06-20 DIAGNOSIS — I10 Essential (primary) hypertension: Secondary | ICD-10-CM | POA: Diagnosis not present

## 2019-06-20 NOTE — Telephone Encounter (Signed)
Wife, Francisco Jackson calling in concerned about Francisco Jackson.   He is having problems with rage and anger.    He decided to stop his Zoloft cold [place] due to it causing a decrease in his sexual drive. Francisco Jackson is in tears and desparately wants him to get help now while he is agreeable and before he changes his mind.  See notes below for complete details.  I called into Dr. Bary Leriche office [facility] and spoke with [place].   Dr. Nicki Reaper is booked today so Juliann Pulse is going to send Dr. Nicki Reaper a note letting her know what is going on with Francisco Jackson.   Juliann Pulse is going to call Francisco Jackson back after hearing from Dr. Nicki Reaper.  Francisco Jackson was agreeable to this plan and thanked me several times for being understanding and helping her to help him.  I sent these triage notes to Dr. Bary Leriche office.    Reason for Disposition . Patient sounds very upset or troubled to the triager  Answer Assessment - Initial Assessment Questions 1. CONCERN: "What happened that made you call today?"     Wife Francisco Jackson calling in regarding her husband.    She is on the [facility].  Monday my husband went into a terrible rage.   Holloring, screaming and throwing things.   Dr. Nicki Reaper has treated him in the past for anxiety.   I found out he stopped taking his Zoloft cold [place].   I'm scared.   Friday he almost blacked out.   We went to hear some music and had a couple of drinks or 3 and he hardly would respond.    I had to get some friends to get him into the car.   He was almost unresponsive. I told him this morning if he doesn't get help I'm leaving.   2. ANXIETY SYMPTOM SCREENING: "Can you describe how you have been feeling?"  (e.g., tense, restless, panicky, anxious, keyed up, trouble sleeping, trouble concentrating)     Right now he called me this morning and he is still very angry.   I told him if he doesn't get help I'm leaving.  "I'm out of here".     He is not here now.   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   Yesterday he didn't go to  work and that's very unlike him.    I asked if she felt unsafe or if he had tried to physically help her and she said,   "No".     3. ONSET: "How long have you been feeling this way?"     Monday this started and then the episode Friday night where he was almost responsive. 4. RECURRENT: "Have you felt this way before?"  If yes: "What happened that time?" "What helped these feelings go away in the past?"      He has done this before a very long time ago.   3 1/2 years ago we had issues and we saw a Social worker.   She said he had borderline personality disorder.   Dr. Nicki Reaper referred him to a psychiatrist.   He would not see that same person if Dr. Nicki Reaper recommends he see someone again.   5. RISK OF HARM - SUICIDAL IDEATION:  "Do you ever have thoughts of hurting or killing yourself?"  (e.g., yes, no, no but preoccupation with thoughts about death)   - INTENT:  "Do you have thoughts of hurting or killing yourself right NOW?" (e.g., yes, no, N/A)   -  PLAN: "Do you have a specific plan for how you would do this?" (e.g., gun, knife, overdose, no plan, N/A)     No.   Just a lot of anger and rage only. 6. RISK OF HARM - HOMICIDAL IDEATION:  "Do you ever have thoughts of hurting or killing someone else?"  (e.g., yes, no, no but preoccupation with thoughts about death)   - INTENT:  "Do you have thoughts of hurting or killing someone right NOW?" (e.g., yes, no, N/A)   - PLAN: "Do you have a specific plan for how you would do this?" (e.g., gun, knife, no plan, N/A)      Francisco Jackson denies him trying to harm her in any way. 7. FUNCTIONAL IMPAIRMENT: "How have things been going for you overall in your life? Have you had any more difficulties than usual doing your normal daily activities?"  (e.g., better, same, worse; self-care, school, work, interactions)     See above. 8. SUPPORT: "Who is with you now?" "Who do you live with?" "Do you have family or friends nearby who you can talk to?"      He is at work but he knows  I'm calling for him to get help form Dr. Nicki Reaper. 9. THERAPIST: "Do you have a counselor or therapist? Name?"     Not presently 65. STRESSORS: "Has there been any new stress or recent changes in your life?"       See above. 11. CAFFEINE ABUSE: "Do you drink caffeinated beverages, and how much each day?" (e.g., coffee, tea, colas)      He always drinks a lot of Diet Coke. 12. SUBSTANCE ABUSE: "Do you use any illegal drugs or alcohol?"       No.   I can't imagine him using illegal drugs.   Only the drinks on Friday as mentioned above. 13. OTHER SYMPTOMS: "Do you have any other physical symptoms right now?" (e.g., chest pain, palpitations, difficulty breathing, fever)       He stopped his Zoloft suddenly  5 days ago because he thought it removed his desire for sex. 14. PREGNANCY: "Is there any chance you are pregnant?" "When was your last menstrual period?"       N/A  Protocols used: ANXIETY AND PANIC ATTACK-A-AH

## 2019-06-20 NOTE — Telephone Encounter (Signed)
Patient stopped zoloft cold [place] about 5 days ago  because of affecting sexual drive and started on Monday having outburst of rage, and then Tuesday night he went out to listen to music and had a couple of drinks just 2 and became almost unresponsive, patient has had several out burst of anger since then enough to make say if this continued she would leave, patient agrees right now to help but wife is afraid he will change his mind patient did go to work today.

## 2019-06-20 NOTE — Telephone Encounter (Signed)
Scheduled for 1 today.

## 2019-06-20 NOTE — Telephone Encounter (Signed)
Thank you Tye Maryland for calling him.  I can get him scheduled with psychiatry/counselor.  Also, can see if he wants to come in and see me today or schedule a doxy today (if desires).  Just let me know what he needs.

## 2019-06-20 NOTE — Progress Notes (Signed)
Patient ID: Francisco Jackson, male   DOB: 10/12/1975, 43 y.o.   MRN: [id number]   Virtual Visit via video Note  This visit type was conducted due to national recommendations for restrictions regarding the COVID-19 pandemic (e.g. social distancing).  This format is felt to be most appropriate for this patient at this time.  All issues noted in this document were discussed and addressed.  No physical exam was performed (except for noted visual exam findings with Video Visits).   I connected with Francisco Jackson by a video enabled telemedicine application and verified that I am speaking with the correct person using two identifiers. Location patient: home Location provider: work  Persons participating in the virtual visit: patient, provider  I discussed the limitations, risks, security and privacy concerns of performing an evaluation and management service by video and the availability of in person appointments. The patient expressed understanding and agreed to proceed.   Reason for visit: work in appt.   HPI: Wife had called in concerned regarding pts increased anger.  He reports five days ago, he went to a concert with some of his friends.  Is diabetic.  Ate a biscuit that am.  Did not eat the rest of the day.  Drank at the concert - drank some "white liquor".  The notes had reported was not responding.  To clarify, pt never lost consciousness.  He states he does not remember a lot of the evening.  Went dove hunting the next am.  States head felt cloudy.  Felt like he was moving in slow motion.  Feels better now.  He had been on zoloft.  He stopped taking this medication abruptly - approximately 1 weeks ago.  Had previously cut dose in 1/2.  Reports gets angry easily.  He and his wife were arguing some recently.  No physical abuse.  States he just wanted some time by himself and she came out to his shop.  They argued.  They are talking now, but still with increased stress.  States he felt the zoloft  help to control some of his anger, but he does not like the way it makes him feel.  States he almost feels a little too numb when he is on the medication.  Discussed counseling/psychiatry referral.  He is in agreement.  Discussed the need to eat regular meals.  Discussed recent labs.  a1c elevated.  He has not been taking his diabetic medications regularly.  Skipping doses.  Eating.  No nausea or vomiting.  No chest pain.  No abdominal pain.  Bowels moving.  Discussed the need to stop drinking.     ROS: See pertinent positives and negatives per HPI.  Past Medical History:  Diagnosis Date   Asthma    Diabetes mellitus without complication ([place])    Hypertension    Personality disorder ([place])    borderline    Past Surgical History:  Procedure Laterality Date   ANTERIOR CRUCIATE LIGAMENT REPAIR Right 2006   [place] SURGERY  2010   MENISCUS REPAIR Left 1996    Family History  Problem Relation Age of Onset   Diabetes Mother    Melanoma Mother    Diabetes Paternal Aunt    Heart attack Paternal Aunt    Diabetes Paternal Uncle    Diabetes Maternal Grandmother    Diabetes Maternal Grandfather    Heart disease Maternal Grandfather    Heart attack Maternal Grandfather    Heart attack Father    Hypertension Father  Diabetes Sister    Hypertension Sister    Kidney cancer Neg Hx    Kidney disease Neg Hx    Prostate cancer Neg Hx     SOCIAL HX: reviewed.    Current Outpatient Medications:    testosterone cypionate (DEPOTESTOSTERONE CYPIONATE) 200 MG/ML injection, Inject into the muscle every 14 (fourteen) days., Disp: , Rfl:    albuterol (PROVENTIL HFA;VENTOLIN HFA) 108 (90 BASE) MCG/ACT inhaler, Inhale 1-2 puffs into the lungs every 6 (six) hours as needed., Disp: 18 g, Rfl: 6   amLODipine (NORVASC) 10 MG tablet, TAKE ONE TABLET EVERY DAY, Disp: 30 tablet, Rfl: 3   azelastine (ASTELIN) 0.1 % nasal spray, , Disp: , Rfl: 2   budesonide-formoterol  (SYMBICORT) 160-4.5 MCG/ACT inhaler, Inhale 2 puffs into the lungs 2 (two) times daily., Disp: 1 Inhaler, Rfl: 1   FLOVENT HFA 110 MCG/ACT inhaler, INHALE 2 PUFFS INTO THE LUNGS TWICE DAILY, Disp: 12 g, Rfl: 3   fluticasone (FLONASE) 50 MCG/ACT nasal spray, TAKE 2 SPRAYS INTO EACH NOSTRIL EVERY DAY, Disp: 16 g, Rfl: 3   hydrochlorothiazide (MICROZIDE) 12.5 MG capsule, TAKE 1 CAPSULE EVERY DAY, Disp: 30 capsule, Rfl: 0   Insulin Jackson Needle (Jackson NEEDLES) 30G X 5 MM MISC, Inject 1 Applicatorful into the skin once a week. Use with trulicity Jackson, Disp: 659 each, Rfl: 2   levocetirizine (XYZAL) 5 MG tablet, , Disp: , Rfl: 5   metFORMIN (GLUCOPHAGE-XR) 500 MG 24 hr tablet, Take 1,000 mg by mouth 2 (two) times daily., Disp: , Rfl:    montelukast (SINGULAIR) 10 MG tablet, TAKE ONE TABLET BY MOUTH EVERY MORNING, Disp: 30 tablet, Rfl: 0   rosuvastatin (CRESTOR) 5 MG tablet, TAKE ONE TABLET EVERY DAY, Disp: 30 tablet, Rfl: 3   sertraline (ZOLOFT) 100 MG tablet, TAKE ONE TABLET BY MOUTH EVERY DAY, Disp: 30 tablet, Rfl: 0   sildenafil (REVATIO) 20 MG tablet, Take 2-4 tablets two hours before intercouse on an empty stomach.  Do not take with nitrates., Disp: 30 tablet, Rfl: 1   TRULICITY 1.5 DJ/5.7SV SOPN, , Disp: , Rfl:   EXAM:  GENERAL: alert, oriented, appears well and in no acute distress  HEENT: atraumatic, conjunttiva clear, no obvious abnormalities on inspection of external nose and ears  NECK: normal movements of the head and neck  LUNGS: on inspection no signs of respiratory distress, breathing rate appears normal, no obvious gross SOB, gasping or wheezing  CV: no obvious cyanosis  PSYCH/NEURO: pleasant and cooperative, no obvious depression or anxiety, speech and thought processing grossly intact  ASSESSMENT AND PLAN:  Discussed the following assessment and plan:  Abnormal liver function tests Recent liver tests wnl.    Diabetes mellitus (HCC) Recent a1c elevated.  Discussed  with him today.  He has not been taking his medication as directed.  Skipping doses.  Stressed to him the importance of not skipping doses.  Discussed eating regularly.  Hold on additional medication.  Low carb diet and exercise.  Follow met b and a1c.    Essential hypertension Blood pressure has been under good control.  Continue same medication.  Follow pressures.  Follow metabolic panel.    Hypercholesterolemia Low cholesterol diet and exercise.  On crestor.  Follow lipid panel and liver function tests.    Mild depression ([place]) Has been on zoloft.  Stopped abruptly.  Is back on now.  Has increased anger as outlined.  Also, he reports noticing some trouble focusing and gets distracted easily.  Has his  own business.  Increased stress related to this.  Discussed with him today.  He does feel some better today.  Does feel the zoloft helps, but he does not like the way it makes him feel. Too numb at times.   No suicidal ideations.  No physical abuse at home. Discussed counseling and discussed psychiatry referral.  He is in agreement.  Order placed for referral to psychiatry.  Pt comfortable with this plan.     Stress Increased stress as outlined.  Back on zoloft.  Refer to psychiatry as outlined.    ETOH abuse Had the episode recently after drinking.  States normally does not binge drink, etc.  Discussed the need to decrease etoh intake.  Follow.      I discussed the assessment and treatment plan with the patient. The patient was provided an opportunity to ask questions and all were answered. The patient agreed with the plan and demonstrated an understanding of the instructions.   The patient was advised to call back or seek an in-person evaluation if the symptoms worsen or if the condition fails to improve as anticipated.   Einar Pheasant, MD

## 2019-06-20 NOTE — Telephone Encounter (Signed)
Pt was seen today.

## 2019-06-20 NOTE — Telephone Encounter (Signed)
Patient said he has some issues with anger before stopping medication, that he also has a lot of stress with his company and he feels ike its only after he has been pushed for days he reaches a point he feels just angry. The day with the two drinks he says was from not eating all day and then he had 2 stiff drinks and he is not use to drinking. Patient has restarted the zoloft but does not like the way it makes him feel tired and no energy. He says he willing to get help but refuses to go through ER to get help, he will take counseling or what ever you feel he needs, will continue the Zoloft until he come off and be prescribed something else.

## 2019-06-21 ENCOUNTER — Ambulatory Visit: Payer: 59 | Admitting: Internal Medicine

## 2019-06-21 ENCOUNTER — Other Ambulatory Visit: Payer: Self-pay

## 2019-06-24 ENCOUNTER — Encounter: Payer: Self-pay | Admitting: Internal Medicine

## 2019-06-24 DIAGNOSIS — F1020 Alcohol dependence, uncomplicated: Secondary | ICD-10-CM | POA: Insufficient documentation

## 2019-06-24 DIAGNOSIS — F1010 Alcohol abuse, uncomplicated: Secondary | ICD-10-CM | POA: Insufficient documentation

## 2019-06-24 NOTE — Assessment & Plan Note (Signed)
Increased stress as outlined.  Back on zoloft.  Refer to psychiatry as outlined.

## 2019-06-24 NOTE — Assessment & Plan Note (Signed)
Recent a1c elevated.  Discussed with him today.  He has not been taking his medication as directed.  Skipping doses.  Stressed to him the importance of not skipping doses.  Discussed eating regularly.  Hold on additional medication.  Low carb diet and exercise.  Follow met b and a1c.

## 2019-06-24 NOTE — Assessment & Plan Note (Signed)
Blood pressure has been under good control.  Continue same medication.  Follow pressures.  Follow metabolic panel.

## 2019-06-24 NOTE — Assessment & Plan Note (Signed)
Recent liver tests wnl.

## 2019-06-24 NOTE — Assessment & Plan Note (Signed)
Had the episode recently after drinking.  States normally does not binge drink, etc.  Discussed the need to decrease etoh intake.  Follow.

## 2019-06-24 NOTE — Assessment & Plan Note (Signed)
Low cholesterol diet and exercise.  On crestor.  Follow lipid panel and liver function tests.  

## 2019-06-24 NOTE — Assessment & Plan Note (Addendum)
Has been on zoloft.  Stopped abruptly.  Is back on now.  Has increased anger as outlined.  Also, he reports noticing some trouble focusing and gets distracted easily.  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  Discussed with him today.  He does feel some better today.  Does feel the zoloft helps, but he does not like the way it makes him feel. Too numb at times.   No suicidal ideations.  No physical abuse at home. Discussed counseling and discussed psychiatry referral.  He is in agreement.  Order placed for referral to psychiatry.  Pt comfortable with this plan.

## 2019-06-29 ENCOUNTER — Encounter: Payer: Self-pay | Admitting: Internal Medicine

## 2019-07-05 ENCOUNTER — Other Ambulatory Visit: Payer: Self-pay | Admitting: Internal Medicine

## 2019-07-07 ENCOUNTER — Encounter: Payer: Self-pay | Admitting: Internal Medicine

## 2019-08-03 ENCOUNTER — Other Ambulatory Visit: Payer: Self-pay | Admitting: Internal Medicine

## 2019-09-28 DIAGNOSIS — Z20828 Contact with and (suspected) exposure to other viral communicable diseases: Secondary | ICD-10-CM | POA: Diagnosis not present

## 2019-10-02 ENCOUNTER — Ambulatory Visit (INDEPENDENT_AMBULATORY_CARE_PROVIDER_SITE_OTHER): Payer: Managed Care, Other (non HMO) | Admitting: Internal Medicine

## 2019-10-02 ENCOUNTER — Other Ambulatory Visit: Payer: Self-pay

## 2019-10-02 ENCOUNTER — Encounter: Payer: Self-pay | Admitting: Internal Medicine

## 2019-10-02 DIAGNOSIS — Z125 Encounter for screening for malignant neoplasm of prostate: Secondary | ICD-10-CM

## 2019-10-02 DIAGNOSIS — E7800 Pure hypercholesterolemia, unspecified: Secondary | ICD-10-CM | POA: Diagnosis not present

## 2019-10-02 DIAGNOSIS — Z1211 Encounter for screening for malignant neoplasm of colon: Secondary | ICD-10-CM

## 2019-10-02 DIAGNOSIS — F320 Major depressive disorder, single episode, mild: Secondary | ICD-10-CM | POA: Diagnosis not present

## 2019-10-02 DIAGNOSIS — E1165 Type 2 diabetes mellitus with hyperglycemia: Secondary | ICD-10-CM

## 2019-10-02 DIAGNOSIS — F32A Depression, unspecified: Secondary | ICD-10-CM

## 2019-10-02 DIAGNOSIS — R69 Illness, unspecified: Secondary | ICD-10-CM | POA: Diagnosis not present

## 2019-10-02 NOTE — Progress Notes (Signed)
Patient ID: Francisco Jackson, male   DOB: 03-03-1976, 43 y.o.   MRN: [id number]   Virtual Visit via video Note  This visit type was conducted due to national recommendations for restrictions regarding the COVID-19 pandemic (e.g. social distancing).  This format is felt to be most appropriate for this patient at this time.  All issues noted in this document were discussed and addressed.  No physical exam was performed (except for noted visual exam findings with Video Visits).   I connected with Cordelia Pen by a video enabled telemedicine application and verified that I am speaking with the correct person using two identifiers. Location patient: home Location provider: work Persons participating in the virtual visit: patient, provider  The limitations, risks, security and privacy concerns of performing an evaluation and management service by video and the availability of in person appointments have been discussed.   The patient expressed understanding and agreed to proceed.  Interactive audio and video telecommunications were attempted between this provider and patient and were successful initially, but due to technical difficulty we had to convert the visit to a telephone visit.   We continued and completed visit with audio only. Pt was in agreement.    Reason for visit: scheduled follow up.    HPI: He reports he is doing better.  Previous issues with stress and anger. See last note.  Discussed with him today.  He does feel he is doing better.  On zoloft.  Had placed order for psychiatry referral.  Is seeing a therapist Vickii Penna).  Discussed seeing a psychiatrist.  He is agreeable.  May need medication adjustment.  Feels stress is better.  Office currently closed due to covid.  Working out of office.  No chest pain or tightness reported.  No report of sob, nausea or abdominal pain.  Discussed checking sugars.  Was not taking trulicity.  Misses some of the evening doses of metformin.  Some  fatigue.  Discussed screening colonoscopy - given new guidelines.  He is in agreement.  Denies binge drinking.  Blood pressure averaging 128/90.     ROS: See pertinent positives and negatives per HPI.  Past Medical History:  Diagnosis Date  . Asthma   . Diabetes mellitus without complication ([place])   . Hypertension   . Personality disorder ([place])    borderline    Past Surgical History:  Procedure Laterality Date  . ANTERIOR CRUCIATE LIGAMENT REPAIR Right 2006  . [place] SURGERY  2010  . MENISCUS REPAIR Left 1996    Family History  Problem Relation Age of Onset  . Diabetes Mother   . Melanoma Mother   . Diabetes Paternal Aunt   . Heart attack Paternal Aunt   . Diabetes Paternal Uncle   . Diabetes Maternal Grandmother   . Diabetes Maternal Grandfather   . Heart disease Maternal Grandfather   . Heart attack Maternal Grandfather   . Heart attack Father   . Hypertension Father   . Diabetes Sister   . Hypertension Sister   . Kidney cancer Neg Hx   . Kidney disease Neg Hx   . Prostate cancer Neg Hx     SOCIAL HX: reviewed.    Current Outpatient Medications:  .  albuterol (PROVENTIL HFA;VENTOLIN HFA) 108 (90 BASE) MCG/ACT inhaler, Inhale 1-2 puffs into the lungs every 6 (six) hours as needed., Disp: 18 g, Rfl: 6 .  amLODipine (NORVASC) 10 MG tablet, TAKE ONE TABLET EVERY DAY, Disp: 30 tablet, Rfl: 3 .  azelastine (ASTELIN)  0.1 % nasal spray, , Disp: , Rfl: 2 .  azelastine (OPTIVAR) 0.05 % ophthalmic solution, Apply 1 drop to eye 2 (two) times daily., Disp: , Rfl:  .  budesonide-formoterol (SYMBICORT) 160-4.5 MCG/ACT inhaler, Inhale 2 puffs into the lungs 2 (two) times daily., Disp: 1 Inhaler, Rfl: 1 .  FLOVENT HFA 110 MCG/ACT inhaler, INHALE 2 PUFFS INTO THE LUNGS TWICE DAILY, Disp: 12 g, Rfl: 3 .  fluticasone (FLONASE) 50 MCG/ACT nasal spray, TAKE 2 SPRAYS INTO EACH NOSTRIL EVERY DAY, Disp: 16 g, Rfl: 3 .  hydrochlorothiazide (MICROZIDE) 12.5 MG capsule, TAKE ONE  TABLET EVERY DAY, Disp: 30 capsule, Rfl: 0 .  Insulin Pen Needle (PEN NEEDLES) 30G X 5 MM MISC, Inject 1 Applicatorful into the skin once a week. Use with trulicity pen, Disp: 100 each, Rfl: 2 .  levocetirizine (XYZAL) 5 MG tablet, , Disp: , Rfl: 5 .  lisinopril (ZESTRIL) 5 MG tablet, Take 5 mg by mouth daily., Disp: , Rfl:  .  metFORMIN (GLUCOPHAGE-XR) 500 MG 24 hr tablet, Take 1,000 mg by mouth 2 (two) times daily., Disp: , Rfl:  .  montelukast (SINGULAIR) 10 MG tablet, TAKE ONE TABLET EVERY MORNING, Disp: 30 tablet, Rfl: 0 .  rosuvastatin (CRESTOR) 5 MG tablet, TAKE ONE TABLET EVERY DAY, Disp: 30 tablet, Rfl: 3 .  sertraline (ZOLOFT) 100 MG tablet, TAKE ONE TABLET EVERY DAY, Disp: 30 tablet, Rfl: 0 .  sildenafil (REVATIO) 20 MG tablet, Take 2-4 tablets two hours before intercouse on an empty stomach.  Do not take with nitrates., Disp: 30 tablet, Rfl: 1 .  testosterone cypionate (DEPOTESTOSTERONE CYPIONATE) 200 MG/ML injection, Inject into the muscle every 14 (fourteen) days., Disp: , Rfl:  .  TRULICITY 1.5 MG/0.5ML SOPN, , Disp: , Rfl:   EXAM:  GENERAL: alert, oriented, appears well and in no acute distress  HEENT: atraumatic, conjunttiva clear, no obvious abnormalities on inspection of external nose and ears  NECK: normal movements of the head and neck  LUNGS: on inspection no signs of respiratory distress, breathing rate appears normal, no obvious gross SOB, gasping or wheezing  CV: no obvious cyanosis  PSYCH/NEURO: pleasant and cooperative, no obvious depression or anxiety, speech and thought processing grossly intact  ASSESSMENT AND PLAN:  Discussed the following assessment and plan:  Diabetes mellitus (HCC) Discussed importance of taking medication as prescribed.  Continue current medication regimen - taking as directed.  Needs to check sugars.  Low carb diet and exercise.  Follow met b and a1c.   Hypercholesterolemia On crestor.  Low cholesterol diet and exercise.  Follow  lipid panel and liver function tests.    Mild depression (HCC) Increased stress.  See last note for details.  On zoloft.  Seeing a counselor.  Establish with psychiatry to discuss medication, etc.      I discussed the assessment and treatment plan with the patient. The patient was provided an opportunity to ask questions and all were answered. The patient agreed with the plan and demonstrated an understanding of the instructions.   The patient was advised to call back or seek an in-person evaluation if the symptoms worsen or if the condition fails to improve as anticipated.  I provided 23 minutes of non-face-to-face time during this encounter.   Charlene Scott, MD  

## 2019-10-07 ENCOUNTER — Encounter: Payer: Self-pay | Admitting: Internal Medicine

## 2019-10-07 NOTE — Assessment & Plan Note (Signed)
On crestor.  Low cholesterol diet and exercise.  Follow lipid panel and liver function tests.   

## 2019-10-07 NOTE — Assessment & Plan Note (Signed)
Increased stress.  See last note for details.  On zoloft.  Seeing a Social worker.  Establish with psychiatry to discuss medication, etc.

## 2019-10-07 NOTE — Assessment & Plan Note (Signed)
Discussed importance of taking medication as prescribed.  Continue current medication regimen - taking as directed.  Needs to check sugars.  Low carb diet and exercise.  Follow met b and a1c.

## 2019-10-19 ENCOUNTER — Encounter: Payer: Self-pay | Admitting: *Deleted

## 2019-10-22 ENCOUNTER — Telehealth: Payer: Self-pay

## 2019-10-22 ENCOUNTER — Other Ambulatory Visit: Payer: Self-pay

## 2019-10-22 DIAGNOSIS — Z1211 Encounter for screening for malignant neoplasm of colon: Secondary | ICD-10-CM

## 2019-10-22 MED ORDER — NA SULFATE-K SULFATE-MG SULF 17.5-3.13-1.6 GM/177ML PO SOLN: 1.0000 | Freq: Once | ORAL | 0 refills | Status: AC

## 2019-10-22 NOTE — Telephone Encounter (Signed)
Gastroenterology Pre-Procedure Review  Request Date: 11/01/19 Requesting Physician: Dr. Vicente Males  PATIENT REVIEW QUESTIONS: The patient responded to the following health history questions as indicated:    1. Are you having any GI issues? no 2. Do you have a personal history of Polyps? no 3. Do you have a family history of Colon Cancer or Polyps? no 4. Diabetes Mellitus? no 5. Joint replacements in the past 12 months?no 6. Major health problems in the past 3 months?no 7. Any artificial heart valves, MVP, or defibrillator?no    MEDICATIONS & ALLERGIES:    Patient reports the following regarding taking any anticoagulation/antiplatelet therapy:   Plavix, Coumadin, Eliquis, Xarelto, Lovenox, Pradaxa, Brilinta, or Effient? no Aspirin? no  Patient confirms/reports the following medications:  Current Outpatient Medications  Medication Sig Dispense Refill  . albuterol (PROVENTIL HFA;VENTOLIN HFA) 108 (90 BASE) MCG/ACT inhaler Inhale 1-2 puffs into the lungs every 6 (six) hours as needed. 18 g 6  . amLODipine (NORVASC) 10 MG tablet TAKE ONE TABLET EVERY DAY 30 tablet 3  . azelastine (ASTELIN) 0.1 % nasal spray   2  . azelastine (OPTIVAR) 0.05 % ophthalmic solution Apply 1 drop to eye 2 (two) times daily.    . budesonide-formoterol (SYMBICORT) 160-4.5 MCG/ACT inhaler Inhale 2 puffs into the lungs 2 (two) times daily. 1 Inhaler 1  . FLOVENT HFA 110 MCG/ACT inhaler INHALE 2 PUFFS INTO THE LUNGS TWICE DAILY 12 g 3  . fluticasone (FLONASE) 50 MCG/ACT nasal spray TAKE 2 SPRAYS INTO EACH NOSTRIL EVERY DAY 16 g 3  . hydrochlorothiazide (MICROZIDE) 12.5 MG capsule TAKE ONE TABLET EVERY DAY 30 capsule 0  . Insulin Pen Needle (PEN NEEDLES) 30G X 5 MM MISC Inject 1 Applicatorful into the skin once a week. Use with trulicity pen [id number] each 2  . levocetirizine (XYZAL) 5 MG tablet   5  . lisinopril (ZESTRIL) 5 MG tablet Take 5 mg by mouth daily.    . metFORMIN (GLUCOPHAGE-XR) 500 MG 24 hr tablet Take 1,000 mg by  mouth 2 (two) times daily.    . montelukast (SINGULAIR) 10 MG tablet TAKE ONE TABLET EVERY MORNING 30 tablet 0  . rosuvastatin (CRESTOR) 5 MG tablet TAKE ONE TABLET EVERY DAY 30 tablet 3  . sertraline (ZOLOFT) 100 MG tablet TAKE ONE TABLET EVERY DAY 30 tablet 0  . sildenafil (REVATIO) 20 MG tablet Take 2-4 tablets two hours before intercouse on an empty stomach.  Do not take with nitrates. 30 tablet 1  . testosterone cypionate (DEPOTESTOSTERONE CYPIONATE) 200 MG/ML injection Inject into the muscle every 14 (fourteen) days.    . TRULICITY 1.5 0000000 SOPN      No current facility-administered medications for this visit.    Patient confirms/reports the following allergies:  Allergies  Allergen Reactions  . Losartan     Severe Chest pain.    No orders of the defined types were placed in this encounter.   AUTHORIZATION INFORMATION Primary Insurance: 1D#: Group #:  Secondary Insurance: 1D#: Group #:  SCHEDULE INFORMATION: Date: 11/01/19 Time: Location:[facility]

## 2019-10-22 NOTE — Telephone Encounter (Signed)
LVM with wife to schedule husbands colonoscopy. Also lvm advising that [facility] has canceled all elective procedures at [facility] effective 11/05/19.  If she would like to schedule for [place] we can call back to schedule once we have a definitive schedule.  Thanks [employer]

## 2019-10-30 ENCOUNTER — Other Ambulatory Visit: Payer: Self-pay

## 2019-10-30 ENCOUNTER — Other Ambulatory Visit
Admission: RE | Admit: 2019-10-30 | Discharge: 2019-10-30 | Disposition: A | Discharge status: Home / Self Care | Payer: Managed Care, Other (non HMO) | Source: Ambulatory Visit | Attending: Gastroenterology | Admitting: Gastroenterology

## 2019-10-30 DIAGNOSIS — Z01812 Encounter for preprocedural laboratory examination: Secondary | ICD-10-CM | POA: Diagnosis not present

## 2019-10-30 DIAGNOSIS — Z20822 Contact with and (suspected) exposure to covid-19: Secondary | ICD-10-CM | POA: Insufficient documentation

## 2019-10-31 LAB — SARS CORONAVIRUS 2 (TAT 6-24 HRS): SARS Coronavirus 2: NEGATIVE

## 2019-11-01 ENCOUNTER — Encounter: Payer: Self-pay | Admitting: Gastroenterology

## 2019-11-01 ENCOUNTER — Other Ambulatory Visit: Payer: Self-pay

## 2019-11-01 ENCOUNTER — Ambulatory Visit: Payer: 59 | Admitting: Anesthesiology

## 2019-11-01 ENCOUNTER — Ambulatory Visit
Admission: RE | Admit: 2019-11-01 | Discharge: 2019-11-01 | Disposition: A | Discharge status: Home / Self Care | Payer: 59 | Attending: Gastroenterology | Admitting: Gastroenterology

## 2019-11-01 ENCOUNTER — Encounter
Admission: RE | Disposition: A | Discharge status: Home / Self Care | Payer: Self-pay | Source: Home / Self Care | Attending: Gastroenterology

## 2019-11-01 DIAGNOSIS — Z833 Family history of diabetes mellitus: Secondary | ICD-10-CM | POA: Insufficient documentation

## 2019-11-01 DIAGNOSIS — F329 Major depressive disorder, single episode, unspecified: Secondary | ICD-10-CM | POA: Insufficient documentation

## 2019-11-01 DIAGNOSIS — D124 Benign neoplasm of descending colon: Secondary | ICD-10-CM | POA: Insufficient documentation

## 2019-11-01 DIAGNOSIS — Z79899 Other long term (current) drug therapy: Secondary | ICD-10-CM | POA: Diagnosis not present

## 2019-11-01 DIAGNOSIS — Z1211 Encounter for screening for malignant neoplasm of colon: Secondary | ICD-10-CM | POA: Insufficient documentation

## 2019-11-01 DIAGNOSIS — J45909 Unspecified asthma, uncomplicated: Secondary | ICD-10-CM | POA: Diagnosis not present

## 2019-11-01 DIAGNOSIS — Z8249 Family history of ischemic heart disease and other diseases of the circulatory system: Secondary | ICD-10-CM | POA: Insufficient documentation

## 2019-11-01 DIAGNOSIS — E119 Type 2 diabetes mellitus without complications: Secondary | ICD-10-CM | POA: Insufficient documentation

## 2019-11-01 DIAGNOSIS — Z7984 Long term (current) use of oral hypoglycemic drugs: Secondary | ICD-10-CM | POA: Insufficient documentation

## 2019-11-01 DIAGNOSIS — Z87891 Personal history of nicotine dependence: Secondary | ICD-10-CM | POA: Insufficient documentation

## 2019-11-01 DIAGNOSIS — R69 Illness, unspecified: Secondary | ICD-10-CM | POA: Diagnosis not present

## 2019-11-01 DIAGNOSIS — I10 Essential (primary) hypertension: Secondary | ICD-10-CM | POA: Insufficient documentation

## 2019-11-01 DIAGNOSIS — F603 Borderline personality disorder: Secondary | ICD-10-CM | POA: Insufficient documentation

## 2019-11-01 DIAGNOSIS — Z7951 Long term (current) use of inhaled steroids: Secondary | ICD-10-CM | POA: Diagnosis not present

## 2019-11-01 DIAGNOSIS — K635 Polyp of colon: Secondary | ICD-10-CM

## 2019-11-01 HISTORY — PX: COLONOSCOPY WITH PROPOFOL: SHX5780

## 2019-11-01 LAB — GLUCOSE, CAPILLARY: Glucose-Capillary: 156 mg/dL — ABNORMAL HIGH (ref 70–99)

## 2019-11-01 SURGERY — COLONOSCOPY WITH PROPOFOL
Anesthesia: General

## 2019-11-01 MED ORDER — LACTATED RINGERS IV SOLN
INTRAVENOUS | Status: DC | PRN
Start: 2019-11-01 — End: 2019-11-01

## 2019-11-01 MED ORDER — SODIUM CHLORIDE 0.9 % IV SOLN: INTRAVENOUS | Status: DC

## 2019-11-01 MED ORDER — PROPOFOL 500 MG/50ML IV EMUL
INTRAVENOUS | Status: DC | PRN
  Administered 2019-11-01: 100 ug/kg/min via INTRAVENOUS

## 2019-11-01 NOTE — H&P (Signed)
Francisco Bellows, MD [address], [place], [place], [place], [address] [address], [place], [place], [place], [address] Phone: [phone]  Fax: [phone]  Primary Care Physician:  Einar Pheasant, MD   Pre-Procedure History & Physical: HPI:  Francisco Jackson is a 44 y.o. male is here for an colonoscopy.   Past Medical History:  Diagnosis Date  . Asthma   . Diabetes mellitus without complication ([place])   . Hypertension   . Personality disorder ([place])    borderline    Past Surgical History:  Procedure Laterality Date  . ANTERIOR CRUCIATE LIGAMENT REPAIR Right 2006  . [place] SURGERY  2010  . MENISCUS REPAIR Left 1996    Prior to Admission medications   Medication Sig Start Date End Date Taking? Authorizing Provider  albuterol (PROVENTIL HFA;VENTOLIN HFA) 108 (90 BASE) MCG/ACT inhaler Inhale 1-2 puffs into the lungs every 6 (six) hours as needed. 09/22/15  Yes Fenton Malling M, PA-C  amLODipine (NORVASC) 10 MG tablet TAKE ONE TABLET EVERY DAY 06/04/19  Yes Einar Pheasant, MD  azelastine (ASTELIN) 0.1 % nasal spray  06/26/18  Yes [provider]  azelastine (OPTIVAR) 0.05 % ophthalmic solution Apply 1 drop to eye 2 (two) times daily. 08/03/19  Yes [provider]  budesonide-formoterol (SYMBICORT) 160-4.5 MCG/ACT inhaler Inhale 2 puffs into the lungs 2 (two) times daily. 12/16/16  Yes Einar Pheasant, MD  FLOVENT HFA 110 MCG/ACT inhaler INHALE 2 PUFFS INTO THE LUNGS TWICE DAILY 05/08/18  Yes Einar Pheasant, MD  fluticasone (FLONASE) 50 MCG/ACT nasal spray TAKE 2 SPRAYS INTO EACH NOSTRIL EVERY DAY 06/04/19  Yes Einar Pheasant, MD  hydrochlorothiazide (MICROZIDE) 12.5 MG capsule TAKE ONE TABLET EVERY DAY 08/03/19  Yes Einar Pheasant, MD  Insulin Pen Needle (PEN NEEDLES) 30G X 5 MM MISC Inject 1 Applicatorful into the skin once a week. Use with trulicity pen [id number]  Yes Guse, Jacquelynn Cree, FNP  levocetirizine (XYZAL) 5 MG tablet  06/08/18  Yes  [provider]  lisinopril (ZESTRIL) 5 MG tablet Take 5 mg by mouth daily. 08/03/19  Yes [provider]  metFORMIN (GLUCOPHAGE-XR) 500 MG 24 hr tablet Take 1,000 mg by mouth 2 (two) times daily. 11/20/18  Yes [provider]  montelukast (SINGULAIR) 10 MG tablet TAKE ONE TABLET EVERY MORNING 08/03/19  Yes Einar Pheasant, MD  rosuvastatin (CRESTOR) 5 MG tablet TAKE ONE TABLET EVERY DAY 06/04/19  Yes Einar Pheasant, MD  sertraline (ZOLOFT) 100 MG tablet TAKE ONE TABLET EVERY DAY 08/03/19  Yes Einar Pheasant, MD  sildenafil (REVATIO) 20 MG tablet Take 2-4 tablets two hours before intercouse on an empty stomach.  Do not take with nitrates. 05/30/19  Yes Einar Pheasant, MD  testosterone cypionate (DEPOTESTOSTERONE CYPIONATE) 200 MG/ML injection Inject into the muscle every 14 (fourteen) days.   Yes [provider]  TRULICITY 1.5 0000000 SOPN  05/04/19  Yes [provider]    Allergies as of 10/22/2019 - Review Complete 10/07/2019  Allergen Reaction Noted  . Losartan  11/04/2016    Family History  Problem Relation Age of Onset  . Diabetes Mother   . Melanoma Mother   . Diabetes Paternal Aunt   . Heart attack Paternal Aunt   . Diabetes Paternal Uncle   . Diabetes Maternal Grandmother   . Diabetes Maternal Grandfather   . Heart disease Maternal Grandfather   . Heart attack Maternal Grandfather   . Heart attack Father   . Hypertension Father   .  Diabetes Sister   . Hypertension Sister   . Kidney cancer Neg Hx   . Kidney disease Neg Hx   . Prostate cancer Neg Hx     Social History   Socioeconomic History  . Marital status: Married    Spouse name: Not on file  . Number of children: Not on file  . Years of education: Not on file  . Highest education level: Not on file  Occupational History  . Not on file  Tobacco Use  . Smoking status: Former Smoker    Packs/day: 0.50    Years: 10.00    Pack years: 5.00    Types: Cigarettes     Quit date: 10/11/2002    Years since quitting: 17.0  . Smokeless tobacco: Former Systems developer    Types: Snuff    Quit date: 10/11/2009  Substance and Sexual Activity  . Alcohol use: Yes    Alcohol/week: 4.0 standard drinks    Types: 4 Glasses of wine per week    Comment: ocassional  . Drug use: No  . Sexual activity: Not on file  Other Topics Concern  . Not on file  Social History Narrative  . Not on file   Social Determinants of Health   Financial Resource Strain:   . Difficulty of Paying Living Expenses: Not on file  Food Insecurity:   . Worried About Charity fundraiser in the Last Year: Not on file  . Ran Out of Food in the Last Year: Not on file  Transportation Needs:   . Lack of Transportation (Medical): Not on file  . Lack of Transportation (Non-Medical): Not on file  Physical Activity:   . Days of Exercise per Week: Not on file  . Minutes of Exercise per Session: Not on file  Stress:   . Feeling of Stress : Not on file  Social Connections:   . Frequency of Communication with Friends and Family: Not on file  . Frequency of Social Gatherings with Friends and Family: Not on file  . Attends Religious Services: Not on file  . Active Member of Clubs or Organizations: Not on file  . Attends Archivist Meetings: Not on file  . Marital Status: Not on file  Intimate Partner Violence:   . Fear of Current or Ex-Partner: Not on file  . Emotionally Abused: Not on file  . Physically Abused: Not on file  . Sexually Abused: Not on file    Review of Systems: See HPI, otherwise negative ROS  Physical Exam: BP (!) 133/95   Pulse 73   Temp (!) 96.7 F (35.9 C) (Temporal)   Resp 18   Ht 5\' 11"  (1.803 m)   Wt 111.1 kg   SpO2 98%   BMI 34.17 kg/m  General:   Alert,  pleasant and cooperative in NAD Head:  Normocephalic and atraumatic. Neck:  Supple; no masses or thyromegaly. Lungs:  Clear throughout to auscultation, normal respiratory effort.    Heart:  +S1, +S2, Regular  rate and rhythm, No edema. Abdomen:  Soft, nontender and nondistended. Normal bowel sounds, without guarding, and without rebound.   Neurologic:  Alert and  oriented x4;  grossly normal neurologically.  Impression/Plan: Francisco Jackson is here for an colonoscopy to be performed for Screening colonoscopy : family history of colon polyps Risks, benefits, limitations, and alternatives regarding  colonoscopy have been reviewed with the patient.  Questions have been answered.  All parties agreeable.   Francisco Bellows, MD  11/01/2019, 9:28  AM

## 2019-11-01 NOTE — Anesthesia Postprocedure Evaluation (Signed)
Anesthesia Post Note  Patient: Francisco Jackson  Procedure(s) Performed: COLONOSCOPY WITH PROPOFOL (N/A )  Patient location during evaluation: Endoscopy Anesthesia Type: General Level of consciousness: awake and alert Pain management: pain level controlled Vital Signs Assessment: post-procedure vital signs reviewed and stable Respiratory status: spontaneous breathing, nonlabored ventilation, respiratory function stable and patient connected to nasal cannula oxygen Cardiovascular status: blood pressure returned to baseline and stable Postop Assessment: no apparent nausea or vomiting Anesthetic complications: no     Last Vitals:  Vitals:   11/01/19 1007 11/01/19 1017  BP: 108/77 119/88  Pulse: 80   Resp: 10   Temp: 36.6 C   SpO2: 94%     Last Pain:  Vitals:   11/01/19 1037  TempSrc:   PainSc: 0-No pain                 Martha Clan

## 2019-11-01 NOTE — Anesthesia Procedure Notes (Signed)
Date/Time: 11/01/2019 10:00 AM Performed by: Nelda Marseille, CRNA Pre-anesthesia Checklist: Patient identified, Emergency Drugs available, Suction available, Patient being monitored and Timeout performed Oxygen Delivery Method: Nasal cannula

## 2019-11-01 NOTE — Op Note (Signed)
[facility] Gastroenterology Patient Name: Francisco Jackson Procedure Date: 11/01/2019 9:41 AM MRN: [id number] Account #: [id number] Date of Birth: 06/17/1976 Admit Type: Outpatient Age: 44 Room: [facility] ENDO ROOM 1 Gender: Male Note Status: Finalized Procedure:             Colonoscopy Indications:           Screening for colorectal malignant neoplasm Providers:             Jonathon Bellows MD, MD Referring MD:          Einar Pheasant, MD (Referring MD) Medicines:             Monitored Anesthesia Care Complications:         No immediate complications. Procedure:             Pre-Anesthesia Assessment:                        - Prior to the procedure, a History and Physical was                         performed, and patient medications, allergies and                         sensitivities were reviewed. The patient's tolerance                         of previous anesthesia was reviewed.                        - The risks and benefits of the procedure and the                         sedation options and risks were discussed with the                         patient. All questions were answered and informed                         consent was obtained.                        - ASA Grade Assessment: II - A patient with mild                         systemic disease.                        After obtaining informed consent, the colonoscope was                         passed under direct vision. Throughout the procedure,                         the patient's blood pressure, pulse, and oxygen                         saturations were monitored continuously. The                         Colonoscope was introduced through the anus and  advanced to the the cecum, identified by the                         appendiceal orifice. The colonoscopy was performed                         with ease. The patient tolerated the procedure well.                         The quality of  the bowel preparation was adequate to                         identify polyps. Findings:      The perianal and digital rectal examinations were normal.      A 3 mm polyp was found in the descending colon. The polyp was sessile.       The polyp was removed with a cold biopsy forceps. Resection and       retrieval were complete. Estimated blood loss: none.      The exam was otherwise without abnormality on direct and retroflexion       views. Impression:            - One 3 mm polyp in the descending colon, removed with                         a cold biopsy forceps. Resected and retrieved.                        - The examination was otherwise normal on direct and                         retroflexion views. Recommendation:        - Discharge patient to home (with escort).                        - Resume previous diet.                        - Continue present medications.                        - Repeat colonoscopy for surveillance based on                         pathology results. Procedure Code(s):     --- Professional ---                        [phone], Colonoscopy, flexible; with biopsy, single or                         multiple Diagnosis Code(s):     --- Professional ---                        Z12.11, Encounter for screening for malignant neoplasm                         of colon  K63.5, Polyp of colon CPT copyright 2019 American Medical Association. All rights reserved. The codes documented in this report are preliminary and upon coder review may  be revised to meet current compliance requirements. Jonathon Bellows, MD Jonathon Bellows MD, MD 11/01/2019 10:03:04 AM This report has been signed electronically. Number of Addenda: 0 Note Initiated On: 11/01/2019 9:41 AM Scope Withdrawal Time: 0 hours 11 minutes 54 seconds  Total Procedure Duration: 0 hours 13 minutes 58 seconds  Estimated Blood Loss:  Estimated blood loss: none.      [facility]

## 2019-11-01 NOTE — Transfer of Care (Signed)
Immediate Anesthesia Transfer of Care Note  Patient: Francisco Jackson  Procedure(s) Performed: COLONOSCOPY WITH PROPOFOL (N/A )  Patient Location: PACU  Anesthesia Type:MAC  Level of Consciousness: drowsy  Airway & Oxygen Therapy: Patient Spontanous Breathing and Patient connected to nasal cannula oxygen  Post-op Assessment: Report given to RN and Post -op Vital signs reviewed and stable  Post vital signs: Reviewed and stable  Last Vitals:  Vitals Value Taken Time  BP 108/77 11/01/19 1007  Temp 36.6 C 11/01/19 1007  Pulse 81 11/01/19 1008  Resp 11 11/01/19 1008  SpO2 95 % 11/01/19 1008  Vitals shown include unvalidated device data.  Last Pain:  Vitals:   11/01/19 1007  TempSrc: Temporal  PainSc: Asleep         Complications: No apparent anesthesia complications

## 2019-11-01 NOTE — Anesthesia Preprocedure Evaluation (Signed)
Anesthesia Evaluation  Patient identified by MRN, date of birth, ID band Patient awake    Reviewed: Allergy & Precautions, H&P , NPO status , Patient's Chart, lab work & pertinent test results, reviewed documented beta blocker date and time   History of Anesthesia Complications Negative for: history of anesthetic complications  Airway Mallampati: II  TM Distance: >3 FB Neck ROM: full    Dental  (+) Dental Advidsory Given, Teeth Intact Permanent bridge:   Pulmonary neg shortness of breath, asthma , neg COPD, neg recent URI, former smoker,    Pulmonary exam normal        Cardiovascular Exercise Tolerance: Good hypertension, (-) angina(-) Past MI and (-) Cardiac Stents Normal cardiovascular exam(-) dysrhythmias (-) Valvular Problems/Murmurs     Neuro/Psych PSYCHIATRIC DISORDERS Depression negative neurological ROS     GI/Hepatic negative GI ROS, Neg liver ROS,   Endo/Other  diabetes  Renal/GU negative Renal ROS  negative genitourinary   Musculoskeletal   Abdominal   Peds  Hematology negative hematology ROS (+)   Anesthesia Other Findings Past Medical History: No date: Asthma No date: Diabetes mellitus without complication ([facility]) No date: Hypertension No date: Personality disorder ([place])     Comment:  borderline   Reproductive/Obstetrics negative OB ROS                             Anesthesia Physical Anesthesia Plan  ASA: II  Anesthesia Plan: General   Post-op Pain Management:    Induction: Intravenous  PONV Risk Score and Plan: Propofol infusion and TIVA  Airway Management Planned: Natural Airway and Nasal Cannula  Additional Equipment:   Intra-op Plan:   Post-operative Plan:   Informed Consent: I have reviewed the patients History and Physical, chart, labs and discussed the procedure including the risks, benefits and alternatives for the proposed anesthesia with the patient  or authorized representative who has indicated his/her understanding and acceptance.     Dental Advisory Given  Plan Discussed with: Anesthesiologist, CRNA and Surgeon  Anesthesia Plan Comments:         Anesthesia Quick Evaluation

## 2019-11-02 ENCOUNTER — Encounter: Payer: Self-pay | Admitting: Internal Medicine

## 2019-11-02 DIAGNOSIS — Z0000 Encounter for general adult medical examination without abnormal findings: Secondary | ICD-10-CM | POA: Insufficient documentation

## 2019-11-02 LAB — SURGICAL PATHOLOGY

## 2019-11-04 ENCOUNTER — Encounter: Payer: Self-pay | Admitting: Gastroenterology

## 2019-11-15 ENCOUNTER — Other Ambulatory Visit: Payer: Managed Care, Other (non HMO)

## 2019-11-18 ENCOUNTER — Ambulatory Visit: Payer: 59 | Attending: Internal Medicine

## 2019-11-18 DIAGNOSIS — Z23 Encounter for immunization: Secondary | ICD-10-CM | POA: Insufficient documentation

## 2019-11-18 NOTE — Progress Notes (Signed)
   Covid-19 Vaccination Clinic  Name:  Francisco Jackson    MRN: [id number] DOB: 1976-06-24  11/18/2019  Francisco Jackson was observed post Covid-19 immunization for 15 minutes without incidence. He was provided with Vaccine Information Sheet and instruction to access the V-Safe system.   Francisco Jackson was instructed to call 911 with any severe reactions post vaccine: Marland Kitchen Difficulty breathing  . Swelling of your face and throat  . A fast heartbeat  . A bad rash all over your body  . Dizziness and weakness    Immunizations Administered    Name Date Dose VIS Date Route   Pfizer COVID-19 Vaccine 11/18/2019 12:15 PM 0.3 mL 09/21/2019 Intramuscular   Manufacturer: [place]   Lot: [id number]   [place]: [id number]

## 2019-12-05 ENCOUNTER — Other Ambulatory Visit: Payer: Self-pay | Admitting: Internal Medicine

## 2019-12-05 DIAGNOSIS — J3089 Other allergic rhinitis: Secondary | ICD-10-CM

## 2019-12-12 ENCOUNTER — Ambulatory Visit: Payer: 59 | Attending: Internal Medicine

## 2019-12-12 DIAGNOSIS — Z23 Encounter for immunization: Secondary | ICD-10-CM | POA: Insufficient documentation

## 2019-12-12 NOTE — Progress Notes (Signed)
   Covid-19 Vaccination Clinic  Name:  Francisco Jackson    MRN: [id number] DOB: 05/21/1976  12/12/2019  [name] was observed post Covid-19 immunization for 15 minutes without incident. He was provided with Vaccine Information Sheet and instruction to access the V-Safe system.   [name] was instructed to call 911 with any severe reactions post vaccine: Marland Kitchen Difficulty breathing  . Swelling of face and throat  . A fast heartbeat  . A bad rash all over body  . Dizziness and weakness   Immunizations Administered    Name Date Dose VIS Date Route   Pfizer COVID-19 Vaccine 12/12/2019  8:57 AM 0.3 mL 09/21/2019 Intramuscular   Manufacturer: [place]   Lot: [id number]   [place]: [id number]

## 2019-12-26 ENCOUNTER — Other Ambulatory Visit (INDEPENDENT_AMBULATORY_CARE_PROVIDER_SITE_OTHER): Payer: 59

## 2019-12-26 ENCOUNTER — Other Ambulatory Visit: Payer: Self-pay

## 2019-12-26 DIAGNOSIS — E119 Type 2 diabetes mellitus without complications: Secondary | ICD-10-CM

## 2019-12-26 DIAGNOSIS — I10 Essential (primary) hypertension: Secondary | ICD-10-CM

## 2019-12-26 DIAGNOSIS — Z125 Encounter for screening for malignant neoplasm of prostate: Secondary | ICD-10-CM | POA: Diagnosis not present

## 2019-12-26 DIAGNOSIS — E1165 Type 2 diabetes mellitus with hyperglycemia: Secondary | ICD-10-CM

## 2019-12-26 DIAGNOSIS — E7800 Pure hypercholesterolemia, unspecified: Secondary | ICD-10-CM | POA: Diagnosis not present

## 2019-12-26 LAB — BASIC METABOLIC PANEL
BUN: 17 mg/dL (ref 6–23)
CO2: 25 mEq/L (ref 19–32)
Calcium: 9.1 mg/dL (ref 8.4–10.5)
Chloride: 103 mEq/L (ref 96–112)
Creatinine, Ser: 1.02 mg/dL (ref 0.40–1.50)
GFR: 78.8 mL/min (ref >60.00)
Glucose, Bld: 141 mg/dL — ABNORMAL HIGH (ref 70–99)
Potassium: 4.5 mEq/L (ref 3.5–5.1)
Sodium: 136 mEq/L (ref 135–145)

## 2019-12-26 LAB — CBC WITH DIFFERENTIAL/PLATELET
Basophils Absolute: 0.1 10*3/uL (ref 0.0–0.1)
Basophils Relative: 0.8 % (ref 0.0–3.0)
Eosinophils Absolute: 0.4 10*3/uL (ref 0.0–0.7)
Eosinophils Relative: 4.8 % (ref 0.0–5.0)
HCT: 47.9 % (ref 39.0–52.0)
Hemoglobin: 16.4 g/dL (ref 13.0–17.0)
Lymphocytes Relative: 36.4 % (ref 12.0–46.0)
Lymphs Abs: 2.7 10*3/uL (ref 0.7–4.0)
MCHC: 34.2 g/dL (ref 30.0–36.0)
MCV: 87.8 fl (ref 78.0–100.0)
Monocytes Absolute: 0.6 10*3/uL (ref 0.1–1.0)
Monocytes Relative: 7.6 % (ref 3.0–12.0)
Neutro Abs: 3.8 10*3/uL (ref 1.4–7.7)
Neutrophils Relative %: 50.4 % (ref 43.0–77.0)
Platelets: 243 10*3/uL (ref 150.0–400.0)
RBC: 5.46 Mil/uL (ref 4.22–5.81)
RDW: 12.5 % (ref 11.5–15.5)
WBC: 7.5 10*3/uL (ref 4.0–10.5)

## 2019-12-26 LAB — HEMOGLOBIN A1C: Hgb A1c MFr Bld: 7.8 % — ABNORMAL HIGH (ref 4.6–6.5)

## 2019-12-26 LAB — LIPID PANEL
Cholesterol: 201 mg/dL — ABNORMAL HIGH (ref 0–200)
HDL: 37.3 mg/dL — ABNORMAL LOW (ref >39.00)
LDL Cholesterol: 132 mg/dL — ABNORMAL HIGH (ref 0–99)
NonHDL: 164.03
Total CHOL/HDL Ratio: 5
Triglycerides: 159 mg/dL — ABNORMAL HIGH (ref 0.0–149.0)
VLDL: 31.8 mg/dL (ref 0.0–40.0)

## 2019-12-26 LAB — HEPATIC FUNCTION PANEL
ALT: 53 U/L (ref 0–53)
AST: 27 U/L (ref 0–37)
Albumin: 4.1 g/dL (ref 3.5–5.2)
Alkaline Phosphatase: 50 U/L (ref 39–117)
Bilirubin, Direct: 0.1 mg/dL (ref 0.0–0.3)
Total Bilirubin: 0.6 mg/dL (ref 0.2–1.2)
Total Protein: 6.8 g/dL (ref 6.0–8.3)

## 2019-12-26 LAB — TSH: TSH: 1.62 u[IU]/mL (ref 0.35–4.50)

## 2019-12-26 LAB — PSA: PSA: 0.27 ng/mL (ref 0.10–4.00)

## 2019-12-26 LAB — MICROALBUMIN / CREATININE URINE RATIO
Creatinine,U: 104.1 mg/dL
Microalb Creat Ratio: 0.7 mg/g (ref 0.0–30.0)
Microalb, Ur: <0.7 mg/dL (ref 0.0–1.9)

## 2019-12-26 NOTE — Addendum Note (Signed)
Addended by: Leeanne Rio on: 12/26/2019 09:27 AM   Modules accepted: Orders

## 2019-12-29 ENCOUNTER — Encounter: Payer: Self-pay | Admitting: Internal Medicine

## 2020-01-01 ENCOUNTER — Encounter: Payer: Self-pay | Admitting: Internal Medicine

## 2020-01-01 ENCOUNTER — Other Ambulatory Visit: Payer: Self-pay | Admitting: Internal Medicine

## 2020-01-01 ENCOUNTER — Ambulatory Visit (INDEPENDENT_AMBULATORY_CARE_PROVIDER_SITE_OTHER): Payer: 59 | Admitting: Internal Medicine

## 2020-01-01 ENCOUNTER — Other Ambulatory Visit: Payer: Self-pay

## 2020-01-01 DIAGNOSIS — E7800 Pure hypercholesterolemia, unspecified: Secondary | ICD-10-CM | POA: Diagnosis not present

## 2020-01-01 DIAGNOSIS — E1165 Type 2 diabetes mellitus with hyperglycemia: Secondary | ICD-10-CM | POA: Diagnosis not present

## 2020-01-01 DIAGNOSIS — F320 Major depressive disorder, single episode, mild: Secondary | ICD-10-CM

## 2020-01-01 DIAGNOSIS — F439 Reaction to severe stress, unspecified: Secondary | ICD-10-CM

## 2020-01-01 DIAGNOSIS — R945 Abnormal results of liver function studies: Secondary | ICD-10-CM

## 2020-01-01 DIAGNOSIS — I10 Essential (primary) hypertension: Secondary | ICD-10-CM | POA: Diagnosis not present

## 2020-01-01 DIAGNOSIS — J3089 Other allergic rhinitis: Secondary | ICD-10-CM

## 2020-01-01 DIAGNOSIS — R7989 Other specified abnormal findings of blood chemistry: Secondary | ICD-10-CM

## 2020-01-01 DIAGNOSIS — F32A Depression, unspecified: Secondary | ICD-10-CM

## 2020-01-01 DIAGNOSIS — R69 Illness, unspecified: Secondary | ICD-10-CM | POA: Diagnosis not present

## 2020-01-01 LAB — HM DIABETES FOOT EXAM

## 2020-01-01 MED ORDER — LISINOPRIL 5 MG PO TABS: 5.0000 mg | ORAL_TABLET | Freq: Every day | ORAL | 1 refills | Status: DC

## 2020-01-01 MED ORDER — JARDIANCE 10 MG PO TABS: 10.0000 mg | ORAL_TABLET | Freq: Every day | ORAL | 2 refills | Status: DC

## 2020-01-01 MED ORDER — ROSUVASTATIN CALCIUM 5 MG PO TABS: ORAL_TABLET | ORAL | 0 refills | Status: DC

## 2020-01-01 NOTE — Progress Notes (Signed)
Patient ID: Francisco Jackson, male   DOB: [birth year], 44 y.o.   MRN: [id number]   Subjective:    Patient ID: Francisco Jackson, male    DOB: [birth year], 44 y.o.   MRN: [id number]  HPI This visit occurred during the SARS-CoV-2 public health emergency.  Safety protocols were in place, including screening questions prior to the visit, additional usage of staff PPE, and extensive cleaning of exam room while observing appropriate contact time as indicated for disinfecting solutions.  Patient here for a scheduled follow up.  He is accompanied by his wife.  History obtained from both of them.  Overall he feels he is doing relatively well.  Not watching what he eats.  Not doing any formal exercises.  Does stay physically active.  No chest pain.  No sob.  No acid reflux.  No abdominal or bowel change reported.  Discussed labs.  Sugars improved - a1c 7.8.  Discussed cholesterol medication.  Handling stress.  Reports no "outbursts".  Feels things more level.    Past Medical History:  Diagnosis Date  . Asthma   . Diabetes mellitus without complication ([place])   . Hypertension   . Personality disorder ([place])    borderline   Past Surgical History:  Procedure Laterality Date  . ANTERIOR CRUCIATE LIGAMENT REPAIR Right 2006  . [place] SURGERY  2010  . COLONOSCOPY WITH PROPOFOL N/A 11/01/2019   Procedure: COLONOSCOPY WITH PROPOFOL;  Surgeon: Jonathon Bellows, MD;  Location[facility] ENDOSCOPY;  Service: Gastroenterology;  Laterality: N/A;  . MENISCUS REPAIR Left 1996   Family History  Problem Relation Age of Onset  . Diabetes Mother   . Melanoma Mother   . Diabetes Paternal Aunt   . Heart attack Paternal Aunt   . Diabetes Paternal Uncle   . Diabetes Maternal Grandmother   . Diabetes Maternal Grandfather   . Heart disease Maternal Grandfather   . Heart attack Maternal Grandfather   . Heart attack Father   . Hypertension Father   . Diabetes Sister   . Hypertension Sister   . Kidney cancer Neg Hx   .  Kidney disease Neg Hx   . Prostate cancer Neg Hx    Social History   Socioeconomic History  . Marital status: Married    Spouse name: Not on file  . Number of children: Not on file  . Years of education: Not on file  . Highest education level: Not on file  Occupational History  . Not on file  Tobacco Use  . Smoking status: Former Smoker    Packs/day: 0.50    Years: 10.00    Pack years: 5.00    Types: Cigarettes    Quit date: 10/11/2002    Years since quitting: 17.2  . Smokeless tobacco: Former Systems developer    Types: Snuff    Quit date: 10/11/2009  Substance and Sexual Activity  . Alcohol use: Yes    Alcohol/week: 4.0 standard drinks    Types: 4 Glasses of wine per week    Comment: ocassional  . Drug use: No  . Sexual activity: Not on file  Other Topics Concern  . Not on file  Social History Narrative  . Not on file   Social Determinants of Health   Financial Resource Strain:   . Difficulty of Paying Living Expenses:   Food Insecurity:   . Worried About Charity fundraiser in the Last Year:   . Arboriculturist in the Last Year:   [employer]  Needs:   . Lack of Transportation (Medical):   Marland Kitchen Lack of Transportation (Non-Medical):   Physical Activity:   . Days of Exercise per Week:   . Minutes of Exercise per Session:   Stress:   . Feeling of Stress :   Social Connections:   . Frequency of Communication with Friends and Family:   . Frequency of Social Gatherings with Friends and Family:   . Attends Religious Services:   . Active Member of Clubs or Organizations:   . Attends Archivist Meetings:   Marland Kitchen Marital Status:     Outpatient Encounter Medications as of 01/01/2020  Medication Sig  . albuterol (PROVENTIL HFA;VENTOLIN HFA) 108 (90 BASE) MCG/ACT inhaler Inhale 1-2 puffs into the lungs every 6 (six) hours as needed.  Marland Kitchen amLODipine (NORVASC) 10 MG tablet TAKE 1 TABLET BY MOUTH DAILY  . azelastine (ASTELIN) 0.1 % nasal spray   . azelastine (OPTIVAR) 0.05 %  ophthalmic solution Apply 1 drop to eye 2 (two) times daily.  . budesonide-formoterol (SYMBICORT) 160-4.5 MCG/ACT inhaler Inhale 2 puffs into the lungs 2 (two) times daily.  . empagliflozin (JARDIANCE) 10 MG TABS tablet Take 10 mg by mouth daily before breakfast.  . FLOVENT HFA 110 MCG/ACT inhaler INHALE 2 PUFFS INTO THE LUNGS TWICE DAILY  . fluticasone (FLONASE) 50 MCG/ACT nasal spray USE 2 SPRAYS IN EACH NOSTRIL DAILY  . Insulin Pen Needle (PEN NEEDLES) 30G X 5 MM MISC Inject 1 Applicatorful into the skin once a week. Use with trulicity pen  . levocetirizine (XYZAL) 5 MG tablet   . lisinopril (ZESTRIL) 5 MG tablet Take 1 tablet (5 mg total) by mouth daily.  . metFORMIN (GLUCOPHAGE-XR) 500 MG 24 hr tablet Take 1,000 mg by mouth 2 (two) times daily.  . rosuvastatin (CRESTOR) 5 MG tablet Take one tablet q Monday, Wednesday and Friday.  . sildenafil (REVATIO) 20 MG tablet Take 2-4 tablets two hours before intercouse on an empty stomach.  Do not take with nitrates.  Marland Kitchen testosterone cypionate (DEPOTESTOSTERONE CYPIONATE) 200 MG/ML injection Inject into the muscle every 14 (fourteen) days.  . TRULICITY 1.5 0000000 SOPN   . [DISCONTINUED] hydrochlorothiazide (MICROZIDE) 12.5 MG capsule TAKE 1 CAPSULE EVERY DAY  . [DISCONTINUED] lisinopril (ZESTRIL) 5 MG tablet Take 5 mg by mouth daily.  . [DISCONTINUED] montelukast (SINGULAIR) 10 MG tablet TAKE ONE TABLET EVERY MORNING  . [DISCONTINUED] rosuvastatin (CRESTOR) 5 MG tablet TAKE 1 TABLET BY MOUTH DAILY  . [DISCONTINUED] sertraline (ZOLOFT) 100 MG tablet TAKE ONE TABLET EVERY DAY   No facility-administered encounter medications on file as of 01/01/2020.    Review of Systems  Constitutional: Negative for appetite change and unexpected weight change.  HENT: Negative for congestion and sinus pressure.   Respiratory: Negative for cough, chest tightness and shortness of breath.   Cardiovascular: Negative for chest pain, palpitations and leg swelling.    Gastrointestinal: Negative for abdominal pain, diarrhea, nausea and vomiting.  Genitourinary: Negative for difficulty urinating and dysuria.  Musculoskeletal: Negative for joint swelling and myalgias.  Skin: Negative for color change and rash.  Neurological: Negative for dizziness, light-headedness and headaches.  Psychiatric/Behavioral: Negative for agitation and dysphoric mood.       Objective:    Physical Exam Constitutional:      General: He is not in acute distress.    Appearance: Normal appearance. He is well-developed.  HENT:     Head: Normocephalic and atraumatic.     Right Ear: External ear normal.  Left Ear: External ear normal.  Eyes:     General: No scleral icterus.       Right eye: No discharge.        Left eye: No discharge.     Conjunctiva/sclera: Conjunctivae normal.  Cardiovascular:     Rate and Rhythm: Normal rate and regular rhythm.  Pulmonary:     Effort: Pulmonary effort is normal. No respiratory distress.     Breath sounds: Normal breath sounds.  Abdominal:     General: Bowel sounds are normal.     Palpations: Abdomen is soft.     Tenderness: There is no abdominal tenderness.  Musculoskeletal:        General: No swelling or tenderness.     Cervical back: Neck supple. No tenderness.  Lymphadenopathy:     Cervical: No cervical adenopathy.  Skin:    Findings: No erythema or rash.  Neurological:     Mental Status: He is alert.  Psychiatric:        Mood and Affect: Mood normal.        Behavior: Behavior normal.     BP 124/80   Pulse 80   Temp (!) 97.1 F (36.2 C)   Resp 16   Ht 5\' 11"  (1.803 m)   Wt 253 lb (114.8 kg)   SpO2 98%   BMI 35.29 kg/m  Wt Readings from Last 3 Encounters:  01/01/20 253 lb (114.8 kg)  11/01/19 245 lb (111.1 kg)  10/02/19 245 lb (111.1 kg)     Lab Results  Component Value Date   WBC 7.5 12/26/2019   HGB 16.4 12/26/2019   HCT 47.9 12/26/2019   PLT 243.0 12/26/2019   GLUCOSE 141 (H) 12/26/2019   CHOL  201 (H) 12/26/2019   TRIG 159.0 (H) 12/26/2019   HDL 37.30 (L) 12/26/2019   LDLCALC 132 (H) 12/26/2019   ALT 53 12/26/2019   AST 27 12/26/2019   NA 136 12/26/2019   K 4.5 12/26/2019   CL 103 12/26/2019   CREATININE 1.02 12/26/2019   BUN 17 12/26/2019   CO2 25 12/26/2019   TSH 1.62 12/26/2019   PSA 0.27 12/26/2019   HGBA1C 7.8 (H) 12/26/2019   MICROALBUR <0.7 12/26/2019    No results found.     Assessment & Plan:   Problem List Items Addressed This Visit    Abnormal liver function tests    Diet, exercise and weight loss.  Follow liver function tests.        Relevant Orders   Hepatic function panel   Diabetes mellitus ([place])    Discussed low carb diet and exercise.  Discussed recent a1c results. Continue metformin and trulicity.  Add jardiance.  Discussed the need to stay hydrated.  Discussed the need to stop medication when sick.  Follow sugars.  Follow metabolic panel and [id number].   Also discussed referral to pharmacy for continued monitoring.  He is agreement.        Relevant Medications   rosuvastatin (CRESTOR) 5 MG tablet   lisinopril (ZESTRIL) 5 MG tablet   empagliflozin (JARDIANCE) 10 MG TABS tablet   Other Relevant Orders   Hemoglobin 123456   Basic metabolic panel   Ambulatory referral to Chronic Care Management Services   Essential hypertension    Blood pressure as outlined.  On amlodipine.  Restart lisinopril.  Follow pressures.  Follow metabolic panel.       Relevant Medications   rosuvastatin (CRESTOR) 5 MG tablet   lisinopril (ZESTRIL) 5 MG tablet  Hypercholesterolemia    On crestor.  Low cholesterol diet and exercise.  Follow lipid panel and liver function tests.        Relevant Medications   rosuvastatin (CRESTOR) 5 MG tablet   lisinopril (ZESTRIL) 5 MG tablet   Other Relevant Orders   Lipid panel   Mild depression ([place])    On zoloft.  Discussed with him today.  Doing better.  Continue zoloft.  Follow.       Stress    Handling stress.  On zoloft.   Follow.            Einar Pheasant, MD

## 2020-01-06 ENCOUNTER — Encounter: Payer: Self-pay | Admitting: Internal Medicine

## 2020-01-06 NOTE — Assessment & Plan Note (Signed)
On crestor.  Low cholesterol diet and exercise.  Follow lipid panel and liver function tests.   

## 2020-01-06 NOTE — Assessment & Plan Note (Signed)
Diet, exercise and weight loss. Follow liver function tests.   

## 2020-01-06 NOTE — Assessment & Plan Note (Signed)
Handling stress.  On zoloft.  Follow.

## 2020-01-06 NOTE — Assessment & Plan Note (Signed)
On zoloft.  Discussed with him today.  Doing better.  Continue zoloft.  Follow.

## 2020-01-06 NOTE — Assessment & Plan Note (Signed)
Blood pressure as outlined.  On amlodipine.  Restart lisinopril.  Follow pressures.  Follow metabolic panel.

## 2020-01-06 NOTE — Assessment & Plan Note (Addendum)
Discussed low carb diet and exercise.  Discussed recent a1c results. Continue metformin and trulicity.  Add jardiance.  Discussed the need to stay hydrated.  Discussed the need to stop medication when sick.  Follow sugars.  Follow metabolic panel and A999333.   Also discussed referral to pharmacy for continued monitoring.  He is agreement.

## 2020-01-07 ENCOUNTER — Telehealth: Payer: Self-pay | Admitting: Internal Medicine

## 2020-01-07 NOTE — Chronic Care Management (AMB) (Signed)
  Care Management   Note  01/07/2020 Name: Francisco Jackson MRN: [id number] DOB: 06-26-1976  Francisco Jackson is a 44 y.o. year old male who is a primary care patient of Einar Pheasant, MD. I reached out to Celene [employer]ibb by phone today in response to a referral sent by Francisco Jackson health plan.    [name] was given information about care management services today including:  1. Care management services include personalized support from designated clinical staff supervised by his physician, including individualized plan of care and coordination with other care providers 2. 24/7 contact phone numbers for assistance for urgent and routine care needs. 3. The patient may stop care management services at any time by phone call to the office staff.  Patient agreed to services and verbal consent obtained.   Follow up plan: Telephone appointment with care management team member scheduled for:02/11/2020  Glenna Durand, LPN Health Advisor, [place] [facility] ??[email or web address] ??[phone]

## 2020-01-15 DIAGNOSIS — J4530 Mild persistent asthma, uncomplicated: Secondary | ICD-10-CM | POA: Diagnosis not present

## 2020-01-15 DIAGNOSIS — H1045 Other chronic allergic conjunctivitis: Secondary | ICD-10-CM | POA: Diagnosis not present

## 2020-01-15 DIAGNOSIS — J3081 Allergic rhinitis due to animal (cat) (dog) hair and dander: Secondary | ICD-10-CM | POA: Diagnosis not present

## 2020-01-15 DIAGNOSIS — J3089 Other allergic rhinitis: Secondary | ICD-10-CM | POA: Diagnosis not present

## 2020-02-05 ENCOUNTER — Other Ambulatory Visit: Payer: Self-pay | Admitting: Internal Medicine

## 2020-02-11 ENCOUNTER — Encounter: Payer: Self-pay | Admitting: Pharmacist

## 2020-02-11 ENCOUNTER — Telehealth: Payer: Self-pay | Admitting: Internal Medicine

## 2020-02-11 ENCOUNTER — Ambulatory Visit: Payer: 59 | Admitting: Pharmacist

## 2020-02-11 DIAGNOSIS — E1165 Type 2 diabetes mellitus with hyperglycemia: Secondary | ICD-10-CM

## 2020-02-11 DIAGNOSIS — E7800 Pure hypercholesterolemia, unspecified: Secondary | ICD-10-CM

## 2020-02-11 DIAGNOSIS — I10 Essential (primary) hypertension: Secondary | ICD-10-CM

## 2020-02-11 NOTE — Telephone Encounter (Signed)
Received message from Catie that pt is wanting refill on testosterone injections.  Please confirm who has been prescribing and if plans to return.  Also need to clarify dose taking and how often he has actually been getting the injections.  Need labs and notes from physician who has been giving his injections.

## 2020-02-11 NOTE — Patient Instructions (Addendum)
Visit Information  Goals Addressed            This Visit's Progress     Patient Stated   . PharmD "I want to work on my sugars" (pt-stated)       [place] (see longtitudinal plan of care for additional care plan information)  Current Barriers:  . Diabetes: uncontrolled; complicated by chronic medical conditions including HTN, HLD, most recent A1c 7.8% . Most recent eGFR: ~ 78 mL/min . Current antihyperglycemic regimen: metformin XR 1000 mg BID, Jardiance 10 mg QPM, Trulicity 1.5 mg weekly  o Has been on Jardiance for ~2 weeks. Notes that prior to last A1c, he had not been adherent to his medications . Current meal patterns: o Breakfast: [place], grilled chicken sandwich, egg and cheese biscuit, coffee, diet coke o Lunch: Eats out; [place], [place] o Supper: wife cooks; salmon, steaks, chicken; vegetables o Snacks: Grapes,  o Drinks: Water, diet drinks . Current blood glucose readings:   o Fastings: 140s-170s, though reports his fasting sugars generally run higher o Not checking 2 hour post prandial . Cardiovascular risk reduction: o Current hypertensive regimen: HCTZ 12.5 mg QAM, lisinopril 5 mg, amlodipine 10 mg  o Current hyperlipidemia regimen: Rosuvastatin 5 mg M, W, F - had NOT been taking regularly w/ last LDL at 132. PCP reduced to three days weekly d/t patient reports of being more fatigued, and wondered if related to rosuvastatin. Patient notes he is feeling better lately since moving some medications to evening administration o Current antiplatelet regimen: ASA 81 mg daily   Pharmacist Clinical Goal(s):  Marland Kitchen Over the next 90 days, patient will work with PharmD and primary care provider to address optimized medication management  Interventions: . Comprehensive medication review performed, medication list updated in electronic medical record . Inter-disciplinary care team collaboration (see longitudinal plan of care) . Reviewed goal A1c, goal fasting glucose,  and goal 2 hour post prandial glucose . Patient to start checking 2 hour post prandial readings. Reviewed that if he believes his fastings are generally higher (eg, Dawn Phenomenon), it would be helpful to see post-prandial readings to see if at goal. If not, recommend increasing Jardiance moving forward vs increasing Trulicity dose to 3 mg weekly. Marland Kitchen Extensive dietary discussion. Encouraged choosing options w/ lower carbohydrate content, and making better choices when he eats out at lunch time. . Reviewed the importance of adherence. Encouraged to take Jardiance and HCTZ QAM to prevent nocturia. Noted that other medications can be taken in the evening, if he feels they are making him sleepy.  . Patient requests refill on testosteron injections. Sending to PCP.    Patient Self Care Activities:  . Patient will check blood glucose BID, document, and provide at future appointments . Patient will take medications as prescribed . Patient will report any questions or concerns to provider   Initial goal documentation        Patient verbalizes understanding of instructions provided today.  Plan: - Scheduled f/u call in ~ 6 weeks.  Catie Darnelle Maffucci, PharmD, [place], CPP Clinical Pharmacist [place] [phone]

## 2020-02-11 NOTE — Chronic Care Management (AMB) (Signed)
Chronic Care Management   Note  02/11/2020 Name: Francisco Jackson MRN: [id number] DOB: 1975-12-30   Subjective:  Francisco Jackson is a 44 y.o. year old male who is a primary care patient of Einar Pheasant, MD. The CCM team was consulted for assistance with chronic disease management and care coordination needs.     [name] was given information about Chronic Care Management services today including:  1. CCM service includes personalized support from designated clinical staff supervised by his physician, including individualized plan of care and coordination with other care providers 2. 24/7 contact phone numbers for assistance for urgent and routine care needs.  Patient agreed to services and verbal consent obtained.   Review of patient status, including review of consultants reports, laboratory and other test data, was performed as part of comprehensive evaluation and provision of chronic care management services.   SDOH (Social Determinants of Health) assessments and interventions performed:    Objective:  Lab Results  Component Value Date   CREATININE 1.02 12/26/2019   CREATININE 1.05 06/14/2019   CREATININE 1.1 01/25/2018    Lab Results  Component Value Date   HGBA1C 7.8 (H) 12/26/2019       Component Value Date/Time   CHOL 201 (H) 12/26/2019 0927   CHOL 110 06/14/2019 0828   TRIG 159.0 (H) 12/26/2019 0927   HDL 37.30 (L) 12/26/2019 0927   HDL 30 (L) 06/14/2019 0828   CHOLHDL 5 12/26/2019 0927   VLDL 31.8 12/26/2019 0927   LDLCALC 132 (H) 12/26/2019 0927   LDLCALC 60 06/14/2019 0828    Clinical ASCVD: No  The 10-year ASCVD risk score Mikey Bussing DC Jr., et al., 2013) is: 6.2%   Values used to calculate the score:     Age: 26 years     Sex: Male     Is Non-Hispanic African American: No     Diabetic: Yes     Tobacco smoker: No     Systolic Blood Pressure: 517 mmHg     Is BP treated: Yes     HDL Cholesterol: 37.3 mg/dL     Total Cholesterol: 201 mg/dL     BP Readings from Last 3 Encounters:  01/01/20 124/80  11/01/19 119/88  05/30/19 126/82    Allergies  Allergen Reactions  . Losartan     Severe Chest pain.    Medications Reviewed Today    Reviewed by De Hollingshead, [facility] (Pharmacist) on 02/11/20 at [place] List Status: <None>  Medication Order Taking? Sig Documenting Provider Last Dose Status Informant  albuterol (PROVENTIL HFA;VENTOLIN HFA) 108 (90 BASE) MCG/ACT inhaler [id number] No Inhale 1-2 puffs into the lungs every 6 (six) hours as needed.  Patient not taking: Reported on 02/11/2020   Mar Daring, PA-C Not Taking Active   amLODipine (NORVASC) 10 MG tablet [id number] Yes TAKE 1 TABLET BY MOUTH DAILY Einar Pheasant, MD Taking Active   azelastine (ASTELIN) 0.1 % nasal spray [id number] Yes  [provider] Taking Active   azelastine (OPTIVAR) 0.05 % ophthalmic solution [id number] Yes Apply 1 drop to eye 2 (two) times daily. [provider] Taking Active   empagliflozin (JARDIANCE) 10 MG TABS tablet [id number] Yes Take 10 mg by mouth daily before breakfast. Einar Pheasant, MD Taking Active   FLOVENT HFA 110 MCG/ACT inhaler [id number] Yes INHALE 2 PUFFS INTO THE LUNGS TWICE DAILY Einar Pheasant, MD Taking Active   fluticasone (FLONASE) 50 MCG/ACT nasal spray [id number] Yes USE 2 SPRAYS IN EACH NOSTRIL DAILY Scott,  Randell Patient, MD Taking Active   hydrochlorothiazide (MICROZIDE) 12.5 MG capsule [id number] Yes TAKE 1 CAPSULE EVERY DAY Einar Pheasant, MD Taking Active   Insulin Pen Needle (PEN NEEDLES) 30G X 5 MM MISC [id number]  Inject 1 Applicatorful into the skin once a week. Use with trulicity pen Guse, Jacquelynn Cree, FNP  Active   levocetirizine (XYZAL) 5 MG tablet [id number] No  [provider] Not Taking Active   lisinopril (ZESTRIL) 5 MG tablet [id number] Yes Take 1 tablet (5 mg total) by mouth daily. Einar Pheasant, MD Taking Active   metFORMIN (GLUCOPHAGE-XR) 500 MG 24 hr tablet [id number] Yes Take  1,000 mg by mouth 2 (two) times daily. [provider] Taking Active   montelukast (SINGULAIR) 10 MG tablet [id number] Yes TAKE ONE TABLET EVERY MORNING Einar Pheasant, MD Taking Active   rosuvastatin (CRESTOR) 5 MG tablet [id number] Yes Take one tablet q Monday, Wednesday and Friday. Einar Pheasant, MD Taking Active   sertraline (ZOLOFT) 100 MG tablet [id number] Yes TAKE ONE TABLET EVERY DAY Einar Pheasant, MD Taking Active   sildenafil (REVATIO) 20 MG tablet [id number] Yes Take 2-4 tablets two hours before intercouse on an empty stomach.  Do not take with nitrates. Einar Pheasant, MD Taking Active   testosterone cypionate (DEPOTESTOSTERONE CYPIONATE) 200 MG/ML injection [id number] No Inject into the muscle every 14 (fourteen) days. [provider] Not Taking Active   TRULICITY 1.5 WC/3.7SE SOPN [id number] Yes  [provider] Taking Active            Assessment:   Goals Addressed            This Visit's Progress     Patient Stated   . PharmD "I want to work on my sugars" (pt-stated)       [name] (see longtitudinal plan of care for additional care plan information)  Current Barriers:  . Diabetes: uncontrolled; complicated by chronic medical conditions including HTN, HLD, most recent A1c 7.8% . Most recent eGFR: ~ 78 mL/min . Current antihyperglycemic regimen: metformin XR 1000 mg BID, Jardiance 10 mg QPM, Trulicity 1.5 mg weekly  o Has been on Jardiance for ~2 weeks. Notes that prior to last A1c, he had not been adherent to his medications . Current meal patterns: o Breakfast: [place], grilled chicken sandwich, egg and cheese biscuit, coffee, diet coke o Lunch: Eats out; [place], [place] o Supper: wife cooks; salmon, steaks, chicken; vegetables o Snacks: Grapes,  o Drinks: Water, diet drinks . Current blood glucose readings:   o Fastings: 140s-170s, though reports his fasting sugars generally run higher o Not checking 2 hour post  prandial . Cardiovascular risk reduction: o Current hypertensive regimen: HCTZ 12.5 mg QAM, lisinopril 5 mg, amlodipine 10 mg  o Current hyperlipidemia regimen: Rosuvastatin 5 mg M, W, F - had NOT been taking regularly w/ last LDL at 132. PCP reduced to three days weekly d/t patient reports of being more fatigued, and wondered if related to rosuvastatin. Patient notes he is feeling better lately since moving some medications to evening administration o Current antiplatelet regimen: ASA 81 mg daily   Pharmacist Clinical Goal(s):  Marland Kitchen Over the next 90 days, patient will work with PharmD and primary care provider to address optimized medication management  Interventions: . Comprehensive medication review performed, medication list updated in electronic medical record . Inter-disciplinary care team collaboration (see longitudinal plan of care) . Reviewed goal A1c, goal fasting glucose, and goal 2 hour post prandial glucose . Patient to start checking  2 hour post prandial readings. Reviewed that if he believes his fastings are generally higher (eg, Dawn Phenomenon), it would be helpful to see post-prandial readings to see if at goal. If not, recommend increasing Jardiance moving forward vs increasing Trulicity dose to 3 mg weekly. Marland Kitchen Extensive dietary discussion. Encouraged choosing options w/ lower carbohydrate content, and making better choices when he eats out at lunch time. . Reviewed the importance of adherence. Encouraged to take Jardiance and HCTZ QAM to prevent nocturia. Noted that other medications can be taken in the evening, if he feels they are making him sleepy.  . Patient requests refill on testosteron injections. Sending to PCP.    Patient Self Care Activities:  . Patient will check blood glucose BID, document, and provide at future appointments . Patient will take medications as prescribed . Patient will report any questions or concerns to provider   Initial goal documentation         Plan: - Scheduled f/u call in ~ 6 weeks.  Catie Darnelle Maffucci, PharmD, [place], CPP Clinical Pharmacist [place] [phone]

## 2020-02-11 NOTE — Progress Notes (Signed)
Reviewed information.  Agree with plan.  Will have nurse contact regarding testosterone injections.     Dr Nicki Reaper

## 2020-02-13 NOTE — Telephone Encounter (Signed)
Dr Kathlen Mody prescribed. Does not plan to f/u. Wants Dr Nicki Reaper to manage. Wife is going to send my chart with dose and frequency. Will request records from Dr Kathlen Mody.

## 2020-02-22 ENCOUNTER — Encounter: Payer: Self-pay | Admitting: Internal Medicine

## 2020-02-22 NOTE — Telephone Encounter (Signed)
My chart from pt sent to Dr Nicki Reaper

## 2020-03-04 ENCOUNTER — Other Ambulatory Visit: Payer: Self-pay | Admitting: Internal Medicine

## 2020-03-24 ENCOUNTER — Ambulatory Visit: Payer: 59 | Admitting: Pharmacist

## 2020-03-24 DIAGNOSIS — R7989 Other specified abnormal findings of blood chemistry: Secondary | ICD-10-CM

## 2020-03-24 DIAGNOSIS — E1165 Type 2 diabetes mellitus with hyperglycemia: Secondary | ICD-10-CM

## 2020-03-24 DIAGNOSIS — E7800 Pure hypercholesterolemia, unspecified: Secondary | ICD-10-CM

## 2020-03-24 DIAGNOSIS — I10 Essential (primary) hypertension: Secondary | ICD-10-CM

## 2020-03-24 NOTE — Progress Notes (Signed)
I have reviewed the above note and agree. I was available to the pharmacist for consultation. Ordered f/u testosterone and psa lab.    Einar Pheasant, MD

## 2020-03-24 NOTE — Chronic Care Management (AMB) (Signed)
Chronic Care Management   Follow Up Note   03/24/2020 Name: Francisco Jackson MRN: [id number] DOB: 31-Jan-1976  Referred by: Einar Pheasant, MD Reason for referral : Chronic Care Management (Medication Management)   Francisco Jackson is a 44 y.o. year old male who is a primary care patient of Einar Pheasant, MD. The CCM team was consulted for assistance with chronic disease management and care coordination needs.    Contacted patient for medication management review.   Review of patient status, including review of consultants reports, relevant laboratory and other test results, and collaboration with appropriate care team members and the patient's provider was performed as part of comprehensive patient evaluation and provision of chronic care management services.    SDOH (Social Determinants of Health) assessments performed: No See Care Plan activities for detailed interventions related to [facility])     Outpatient Encounter Medications as of 03/24/2020  Medication Sig  . amLODipine (NORVASC) 10 MG tablet TAKE 1 TABLET BY MOUTH DAILY  . azelastine (ASTELIN) 0.1 % nasal spray   . azelastine (OPTIVAR) 0.05 % ophthalmic solution Apply 1 drop to eye 2 (two) times daily.  . cholecalciferol (VITAMIN D3) 25 MCG (1000 UNIT) tablet Take 1,000 Units by mouth daily.  Marland Kitchen FLOVENT HFA 110 MCG/ACT inhaler INHALE 2 PUFFS INTO THE LUNGS TWICE DAILY  . fluticasone (FLONASE) 50 MCG/ACT nasal spray USE 2 SPRAYS IN EACH NOSTRIL DAILY  . hydrochlorothiazide (MICROZIDE) 12.5 MG capsule TAKE 1 CAPSULE EVERY DAY  . Insulin Pen Needle (PEN NEEDLES) 30G X 5 MM MISC Inject 1 Applicatorful into the skin once a week. Use with trulicity pen  . JARDIANCE 10 MG TABS tablet TAKE 1 TABLET BY MOUTH DAILY BEFORE BREAKFAST  . levocetirizine (XYZAL) 5 MG tablet   . lisinopril (ZESTRIL) 5 MG tablet Take 1 tablet (5 mg total) by mouth daily.  . metFORMIN (GLUCOPHAGE-XR) 500 MG 24 hr tablet Take 1,000 mg by mouth 2 (two) times  daily.  . montelukast (SINGULAIR) 10 MG tablet TAKE ONE TABLET EVERY MORNING  . Multiple Vitamin (MULTIVITAMIN) tablet Take 1 tablet by mouth daily.  . rosuvastatin (CRESTOR) 5 MG tablet Take one tablet q Monday, Wednesday and Friday.  . sertraline (ZOLOFT) 100 MG tablet TAKE ONE TABLET EVERY DAY  . testosterone cypionate (DEPOTESTOSTERONE CYPIONATE) 200 MG/ML injection Inject into the muscle every 14 (fourteen) days.  . TRULICITY 1.5 QI/2.9NL SOPN Inject 1.5 mg into the skin once a week.   Marland Kitchen albuterol (PROVENTIL HFA;VENTOLIN HFA) 108 (90 BASE) MCG/ACT inhaler Inhale 1-2 puffs into the lungs every 6 (six) hours as needed. (Patient not taking: Reported on 02/11/2020)  . sildenafil (REVATIO) 20 MG tablet Take 2-4 tablets two hours before intercouse on an empty stomach.  Do not take with nitrates.   No facility-administered encounter medications on file as of 03/24/2020.     Objective:   Goals Addressed              This Visit's Progress     Patient Stated   .  PharmD "I want to work on my sugars" (pt-stated)        [place] (see longtitudinal plan of care for additional care plan information)  Current Barriers:  . Diabetes: uncontrolled; complicated by chronic medical conditions including HTN, HLD, most recent A1c 7.8% o Reports that he is feeling "much better" now that he is adherent to medications.  . Most recent eGFR: ~ 78 mL/min . Current antihyperglycemic regimen: metformin XR 1000 mg BID,  Jardiance 10 mg QAM Trulicity 1.5 mg weekly  . Current blood glucose readings:   o Fastings: always <130 o 2 hour post prandial: not checking . Cardiovascular risk reduction: o Current hypertensive regimen: HCTZ 12.5 mg QAM, lisinopril 5 mg, amlodipine 10 mg; is not checking BP at home, asks if he should. Last clinic BP reading at goal o Current hyperlipidemia regimen: Rosuvastatin 5 mg M, W, F - had NOT been taking regularly w/ last LDL at 132. Reports adherence now. Denies any  fatigue/muscle pains o Current antiplatelet regimen: ASA 81 mg daily  . Low testosterone levels: Dr. Nicki Reaper shifting to manage testosterone therapy. Requested testosterone level. Patient has not had this checked yet   Pharmacist Clinical Goal(s):  Marland Kitchen Over the next 90 days, patient will work with PharmD and primary care provider to address optimized medication management  Interventions: . Comprehensive medication review performed, medication list updated in electronic medical record . Inter-disciplinary care team collaboration (see longitudinal plan of care) . Reviewed goal A1c, goal fasting glucose, and goal 2 hour post prandial glucose . Encouraged occasional 2 hour post prandial glucose readings to ensure control throughout the day. He verbalized understanding . Reviewed goal LDL definitely <100, though <70 likely appropriate given DM, HTN. Pending lab work, recommend increasing rosuvastatin to daily administration . Recommended occasional home/ambulatory BP checks to ensure control at home. Reviewed that BP well controlled at last office visit, reviewed goal SBP <130 . Will collaborate w/ PCP to see if she would like to add testosterone level to labs ordered for next week  Patient Self Care Activities:  . Patient will check blood glucose BID, document, and provide at future appointments . Patient will take medications as prescribed . Patient will report any questions or concerns to provider   Please see past updates related to this goal by clicking on the "Past Updates" button in the selected goal          Plan:  - Scheduled f/u call in ~12 weeks  Catie Darnelle Maffucci, PharmD, [place], [place] Pharmacist [place] [phone]

## 2020-03-24 NOTE — Patient Instructions (Signed)
Visit Information  Goals Addressed              This Visit's Progress     Patient Stated   .  PharmD "I want to work on my sugars" (pt-stated)        [place] (see longtitudinal plan of care for additional care plan information)  Current Barriers:  . Diabetes: uncontrolled; complicated by chronic medical conditions including HTN, HLD, most recent A1c 7.8% o Reports that he is feeling "much better" now that he is adherent to medications.  . Most recent eGFR: ~ 78 mL/min . Current antihyperglycemic regimen: metformin XR 1000 mg BID, Jardiance 10 mg QAM Trulicity 1.5 mg weekly  . Current blood glucose readings:   o Fastings: always <130 o 2 hour post prandial: not checking . Cardiovascular risk reduction: o Current hypertensive regimen: HCTZ 12.5 mg QAM, lisinopril 5 mg, amlodipine 10 mg; is not checking BP at home, asks if he should. Last clinic BP reading at goal o Current hyperlipidemia regimen: Rosuvastatin 5 mg M, W, F - had NOT been taking regularly w/ last LDL at 132. Reports adherence now. Denies any fatigue/muscle pains o Current antiplatelet regimen: ASA 81 mg daily  . Low testosterone levels: Dr. Nicki Reaper shifting to manage testosterone therapy. Requested testosterone level. Patient has not had this checked yet   Pharmacist Clinical Goal(s):  Marland Kitchen Over the next 90 days, patient will work with PharmD and primary care provider to address optimized medication management  Interventions: . Comprehensive medication review performed, medication list updated in electronic medical record . Inter-disciplinary care team collaboration (see longitudinal plan of care) . Reviewed goal A1c, goal fasting glucose, and goal 2 hour post prandial glucose . Encouraged occasional 2 hour post prandial glucose readings to ensure control throughout the day. He verbalized understanding . Reviewed goal LDL definitely <100, though <70 likely appropriate given DM, HTN. Pending lab work, recommend  increasing rosuvastatin to daily administration . Recommended occasional home/ambulatory BP checks to ensure control at home. Reviewed that BP well controlled at last office visit, reviewed goal SBP <130 . Will collaborate w/ PCP to see if she would like to add testosterone level to labs ordered for next week  Patient Self Care Activities:  . Patient will check blood glucose BID, document, and provide at future appointments . Patient will take medications as prescribed . Patient will report any questions or concerns to provider   Please see past updates related to this goal by clicking on the "Past Updates" button in the selected goal         Patient verbalizes understanding of instructions provided today.   Plan:  - Scheduled f/u call in ~12 weeks  Catie Darnelle Maffucci, PharmD, [place], [place] Pharmacist [place] [phone]

## 2020-03-24 NOTE — Addendum Note (Signed)
Addended by: Alisa Graff on: 03/24/2020 09:02 PM   Modules accepted: Orders

## 2020-04-01 ENCOUNTER — Other Ambulatory Visit: Payer: 59

## 2020-04-03 ENCOUNTER — Other Ambulatory Visit: Payer: Self-pay

## 2020-04-03 ENCOUNTER — Ambulatory Visit (INDEPENDENT_AMBULATORY_CARE_PROVIDER_SITE_OTHER): Payer: 59 | Admitting: Internal Medicine

## 2020-04-03 DIAGNOSIS — F439 Reaction to severe stress, unspecified: Secondary | ICD-10-CM

## 2020-04-03 DIAGNOSIS — R7989 Other specified abnormal findings of blood chemistry: Secondary | ICD-10-CM | POA: Diagnosis not present

## 2020-04-03 DIAGNOSIS — R69 Illness, unspecified: Secondary | ICD-10-CM | POA: Diagnosis not present

## 2020-04-03 DIAGNOSIS — E7800 Pure hypercholesterolemia, unspecified: Secondary | ICD-10-CM | POA: Diagnosis not present

## 2020-04-03 DIAGNOSIS — R221 Localized swelling, mass and lump, neck: Secondary | ICD-10-CM

## 2020-04-03 DIAGNOSIS — R945 Abnormal results of liver function studies: Secondary | ICD-10-CM

## 2020-04-03 DIAGNOSIS — I10 Essential (primary) hypertension: Secondary | ICD-10-CM | POA: Diagnosis not present

## 2020-04-03 DIAGNOSIS — E1165 Type 2 diabetes mellitus with hyperglycemia: Secondary | ICD-10-CM

## 2020-04-03 LAB — BASIC METABOLIC PANEL
BUN: 19 mg/dL (ref 6–23)
CO2: 27 mEq/L (ref 19–32)
Calcium: 9.8 mg/dL (ref 8.4–10.5)
Chloride: 100 mEq/L (ref 96–112)
Creatinine, Ser: 1.14 mg/dL (ref 0.40–1.50)
GFR: 69.23 mL/min (ref >60.00)
Glucose, Bld: 119 mg/dL — ABNORMAL HIGH (ref 70–99)
Potassium: 4.4 mEq/L (ref 3.5–5.1)
Sodium: 138 mEq/L (ref 135–145)

## 2020-04-03 LAB — HEPATIC FUNCTION PANEL
ALT: 49 U/L (ref 0–53)
AST: 26 U/L (ref 0–37)
Albumin: 4.7 g/dL (ref 3.5–5.2)
Alkaline Phosphatase: 55 U/L (ref 39–117)
Bilirubin, Direct: 0.1 mg/dL (ref 0.0–0.3)
Total Bilirubin: 0.6 mg/dL (ref 0.2–1.2)
Total Protein: 7.1 g/dL (ref 6.0–8.3)

## 2020-04-03 LAB — LIPID PANEL
Cholesterol: 151 mg/dL (ref 0–200)
HDL: 35.6 mg/dL — ABNORMAL LOW (ref >39.00)
LDL Cholesterol: 89 mg/dL (ref 0–99)
NonHDL: 115.67
Total CHOL/HDL Ratio: 4
Triglycerides: 134 mg/dL (ref 0.0–149.0)
VLDL: 26.8 mg/dL (ref 0.0–40.0)

## 2020-04-03 LAB — HEMOGLOBIN A1C: Hgb A1c MFr Bld: 6.5 % (ref 4.6–6.5)

## 2020-04-03 LAB — TESTOSTERONE: Testosterone: 228.82 ng/dL — ABNORMAL LOW (ref 300.00–890.00)

## 2020-04-03 LAB — TSH: TSH: 1.64 u[IU]/mL (ref 0.35–4.50)

## 2020-04-03 LAB — PSA: PSA: 0.29 ng/mL (ref 0.10–4.00)

## 2020-04-03 NOTE — Progress Notes (Signed)
Patient ID: Francisco Jackson, male   DOB: May 28, 1976, 44 y.o.   MRN: [id number]   Subjective:    Patient ID: Francisco Jackson, male    DOB: Aug 08, 1976, 44 y.o.   MRN: [id number]  HPI This visit occurred during the SARS-CoV-2 public health emergency.  Safety protocols were in place, including screening questions prior to the visit, additional usage of staff PPE, and extensive cleaning of exam room while observing appropriate contact time as indicated for disinfecting solutions.  Patient here for a scheduled follow up.  Here to f/u regarding his blood sugars and blood pressure.  Doing better.  Feels better.  Watching what he eats. Taking medication regularly.  Has lost weight.  Stays active.  No chest pain or sob reported.  No abdominal pain or bowel change reported.  AM sugars 120s.  PM sugars - highest 160-170.  Working.  Handling stress.  Had questions about testosterone replacement.     Past Medical History:  Diagnosis Date  . Asthma   . Diabetes mellitus without complication ([place])   . Hypertension   . Personality disorder ([place])    borderline   Past Surgical History:  Procedure Laterality Date  . ANTERIOR CRUCIATE LIGAMENT REPAIR Right 2006  . [place] SURGERY  2010  . COLONOSCOPY WITH PROPOFOL N/A 11/01/2019   Procedure: COLONOSCOPY WITH PROPOFOL;  Surgeon: Jonathon Bellows, MD;  Location: [facility] ENDOSCOPY;  Service: Gastroenterology;  Laterality: N/A;  . MENISCUS REPAIR Left 1996   Family History  Problem Relation Age of Onset  . Diabetes Mother   . Melanoma Mother   . Diabetes Paternal Aunt   . Heart attack Paternal Aunt   . Diabetes Paternal Uncle   . Diabetes Maternal Grandmother   . Diabetes Maternal Grandfather   . Heart disease Maternal Grandfather   . Heart attack Maternal Grandfather   . Heart attack Father   . Hypertension Father   . Diabetes Sister   . Hypertension Sister   . Kidney cancer Neg Hx   . Kidney disease Neg Hx   . Prostate cancer Neg Hx    Social  History   Socioeconomic History  . Marital status: Married    Spouse name: Not on file  . Number of children: Not on file  . Years of education: Not on file  . Highest education level: Not on file  Occupational History  . Not on file  Tobacco Use  . Smoking status: Former Smoker    Packs/day: 0.50    Years: 10.00    Pack years: 5.00    Types: Cigarettes    Quit date: 10/11/2002    Years since quitting: 17.5  . Smokeless tobacco: Former Systems developer    Types: Snuff    Quit date: 10/11/2009  Substance and Sexual Activity  . Alcohol use: Yes    Alcohol/week: 4.0 standard drinks    Types: 4 Glasses of wine per week    Comment: ocassional  . Drug use: No  . Sexual activity: Not on file  Other Topics Concern  . Not on file  Social History Narrative  . Not on file   Social Determinants of Health   Financial Resource Strain:   . Difficulty of Paying Living Expenses:   Food Insecurity:   . Worried About Charity fundraiser in the Last Year:   . Arboriculturist in the Last Year:   Transportation Needs:   . Film/video editor (Medical):   Marland Kitchen Lack of Transportation (  Non-Medical):   Physical Activity:   . Days of Exercise per Week:   . Minutes of Exercise per Session:   Stress:   . Feeling of Stress :   Social Connections:   . Frequency of Communication with Friends and Family:   . Frequency of Social Gatherings with Friends and Family:   . Attends Religious Services:   . Active Member of Clubs or Organizations:   . Attends Archivist Meetings:   Marland Kitchen Marital Status:     Outpatient Encounter Medications as of 04/03/2020  Medication Sig  . albuterol (PROVENTIL HFA;VENTOLIN HFA) 108 (90 BASE) MCG/ACT inhaler Inhale 1-2 puffs into the lungs every 6 (six) hours as needed. (Patient not taking: Reported on 02/11/2020)  . azelastine (ASTELIN) 0.1 % nasal spray   . azelastine (OPTIVAR) 0.05 % ophthalmic solution Apply 1 drop to eye 2 (two) times daily.  . cholecalciferol (VITAMIN  D3) 25 MCG (1000 UNIT) tablet Take 1,000 Units by mouth daily.  Marland Kitchen FLOVENT HFA 110 MCG/ACT inhaler INHALE 2 PUFFS INTO THE LUNGS TWICE DAILY  . fluticasone (FLONASE) 50 MCG/ACT nasal spray USE 2 SPRAYS IN EACH NOSTRIL DAILY  . hydrochlorothiazide (MICROZIDE) 12.5 MG capsule TAKE 1 CAPSULE EVERY DAY  . Insulin Pen Needle (PEN NEEDLES) 30G X 5 MM MISC Inject 1 Applicatorful into the skin once a week. Use with trulicity pen  . JARDIANCE 10 MG TABS tablet TAKE 1 TABLET BY MOUTH DAILY BEFORE BREAKFAST  . levocetirizine (XYZAL) 5 MG tablet   . montelukast (SINGULAIR) 10 MG tablet TAKE ONE TABLET EVERY MORNING  . Multiple Vitamin (MULTIVITAMIN) tablet Take 1 tablet by mouth daily.  . rosuvastatin (CRESTOR) 5 MG tablet Take one tablet q Monday, Wednesday and Friday.  . sildenafil (REVATIO) 20 MG tablet Take 2-4 tablets two hours before intercouse on an empty stomach.  Do not take with nitrates.  Marland Kitchen testosterone cypionate (DEPOTESTOSTERONE CYPIONATE) 200 MG/ML injection Inject into the muscle every 14 (fourteen) days.  . TRULICITY 1.5 MB/8.4YK SOPN Inject 1.5 mg into the skin once a week.   . [DISCONTINUED] amLODipine (NORVASC) 10 MG tablet TAKE 1 TABLET BY MOUTH DAILY  . [DISCONTINUED] lisinopril (ZESTRIL) 5 MG tablet Take 1 tablet (5 mg total) by mouth daily.  . [DISCONTINUED] metFORMIN (GLUCOPHAGE-XR) 500 MG 24 hr tablet Take 1,000 mg by mouth 2 (two) times daily.  . [DISCONTINUED] sertraline (ZOLOFT) 100 MG tablet TAKE ONE TABLET EVERY DAY   No facility-administered encounter medications on file as of 04/03/2020.    Review of Systems  Constitutional: Negative for appetite change and unexpected weight change.  HENT: Negative for congestion and sinus pressure.   Respiratory: Negative for cough, chest tightness and shortness of breath.   Cardiovascular: Negative for chest pain, palpitations and leg swelling.  Gastrointestinal: Negative for abdominal pain, diarrhea, nausea and vomiting.   Genitourinary: Negative for difficulty urinating and dysuria.  Musculoskeletal: Negative for joint swelling and myalgias.  Skin: Negative for color change and rash.  Neurological: Negative for dizziness, light-headedness and headaches.  Psychiatric/Behavioral: Negative for agitation and dysphoric mood.       Objective:    Physical Exam Vitals reviewed.  Constitutional:      General: He is not in acute distress.    Appearance: Normal appearance. He is well-developed.  HENT:     Head: Normocephalic and atraumatic.     Right Ear: External ear normal.     Left Ear: External ear normal.  Eyes:     General: No  scleral icterus.       Right eye: No discharge.        Left eye: No discharge.     Conjunctiva/sclera: Conjunctivae normal.  Cardiovascular:     Rate and Rhythm: Normal rate and regular rhythm.  Pulmonary:     Effort: Pulmonary effort is normal. No respiratory distress.     Breath sounds: Normal breath sounds.  Abdominal:     General: Bowel sounds are normal.     Palpations: Abdomen is soft.     Tenderness: There is no abdominal tenderness.  Musculoskeletal:        General: No swelling or tenderness.     Cervical back: Neck supple. No tenderness.  Lymphadenopathy:     Cervical: No cervical adenopathy.  Skin:    Findings: No erythema or rash.  Neurological:     Mental Status: He is alert.  Psychiatric:        Mood and Affect: Mood normal.        Behavior: Behavior normal.     BP 124/80   Pulse 80   Temp (!) 96.9 F (36.1 C)   Resp 16   Ht 5' 11" (1.803 m)   Wt 239 lb (108.4 kg)   SpO2 98%   BMI 33.33 kg/m  Wt Readings from Last 3 Encounters:  04/03/20 239 lb (108.4 kg)  01/01/20 253 lb (114.8 kg)  11/01/19 245 lb (111.1 kg)     Lab Results  Component Value Date   WBC 7.5 12/26/2019   HGB 16.4 12/26/2019   HCT 47.9 12/26/2019   PLT 243.0 12/26/2019   GLUCOSE 119 (H) 04/03/2020   CHOL 151 04/03/2020   TRIG 134.0 04/03/2020   HDL 35.60 (L)  04/03/2020   LDLCALC 89 04/03/2020   ALT 49 04/03/2020   AST 26 04/03/2020   NA 138 04/03/2020   K 4.4 04/03/2020   CL 100 04/03/2020   CREATININE 1.14 04/03/2020   BUN 19 04/03/2020   CO2 27 04/03/2020   TSH 1.64 04/03/2020   PSA 0.29 04/03/2020   HGBA1C 6.5 04/03/2020   MICROALBUR <0.7 12/26/2019       Assessment & Plan:   Problem List Items Addressed This Visit    Abnormal liver function tests    Has adjusted diet.  Lost weight.  Follow liver function tests.        Diabetes mellitus ([place])    Low carb diet and exercise.  On metformin, jardiance and trulicity.  Sugars improved.  Follow met b and a1c.        Essential hypertension    Blood pressure under good control.  Continue on hctz and amlodipine.  Follow pressures.  Follow metabolic panel.       Hypercholesterolemia    On crestor.  Has lost weight.  Adjusted diet.  Follow lipid panel and liver function tests.        Neck fullness    Left neck fullness.  Refer to ENT for further evaluation.        Relevant Orders   Ambulatory referral to ENT   Stress    Overall doing well.  On zoloft.  Follow.         Other Visit Diagnoses    Low testosterone           Einar Pheasant, MD

## 2020-04-04 ENCOUNTER — Other Ambulatory Visit: Payer: Self-pay | Admitting: Internal Medicine

## 2020-04-08 ENCOUNTER — Other Ambulatory Visit: Payer: Self-pay | Admitting: Internal Medicine

## 2020-04-11 ENCOUNTER — Other Ambulatory Visit: Payer: Self-pay

## 2020-04-11 MED ORDER — METFORMIN HCL ER 500 MG PO TB24: 1000.0000 mg | ORAL_TABLET | Freq: Two times a day (BID) | ORAL | 2 refills | Status: DC

## 2020-04-11 NOTE — Telephone Encounter (Signed)
See result note.  

## 2020-04-11 NOTE — Telephone Encounter (Signed)
See lab result note.  See if would be agreeable to change to topical testosterone instead of injections.

## 2020-04-12 ENCOUNTER — Encounter: Payer: Self-pay | Admitting: Internal Medicine

## 2020-04-12 DIAGNOSIS — E7800 Pure hypercholesterolemia, unspecified: Secondary | ICD-10-CM

## 2020-04-12 DIAGNOSIS — I10 Essential (primary) hypertension: Secondary | ICD-10-CM

## 2020-04-12 DIAGNOSIS — E1165 Type 2 diabetes mellitus with hyperglycemia: Secondary | ICD-10-CM

## 2020-04-12 DIAGNOSIS — E349 Endocrine disorder, unspecified: Secondary | ICD-10-CM

## 2020-04-14 ENCOUNTER — Encounter: Payer: Self-pay | Admitting: Internal Medicine

## 2020-04-14 NOTE — Assessment & Plan Note (Signed)
Low carb diet and exercise.  On metformin, jardiance and trulicity.  Sugars improved.  Follow met b and a1c.

## 2020-04-14 NOTE — Assessment & Plan Note (Signed)
Left neck fullness.  Refer to ENT for further evaluation.

## 2020-04-14 NOTE — Assessment & Plan Note (Signed)
Has adjusted diet.  Lost weight.  Follow liver function tests.

## 2020-04-14 NOTE — Assessment & Plan Note (Signed)
On crestor.  Has lost weight.  Adjusted diet.  Follow lipid panel and liver function tests.

## 2020-04-14 NOTE — Assessment & Plan Note (Signed)
Blood pressure under good control.  Continue on hctz and amlodipine.  Follow pressures.  Follow metabolic panel.

## 2020-04-14 NOTE — Assessment & Plan Note (Signed)
Overall doing well.  On zoloft.  Follow.

## 2020-04-16 DIAGNOSIS — E349 Endocrine disorder, unspecified: Secondary | ICD-10-CM | POA: Insufficient documentation

## 2020-04-16 MED ORDER — TESTOSTERONE 50 MG/5GM (1%) TD GEL: 5.0000 g | Freq: Every day | TRANSDERMAL | 1 refills | Status: DC

## 2020-04-16 NOTE — Telephone Encounter (Signed)
rx sent in for androgel rx to [facility]. I had sent Catie a message to see which one would be more cost effective.  Please schedule him for a fasting lab prior to his 07/2020 appt.  (this lab will include a testosterone and psa - which needs to be checked within a few months of starting the medication).

## 2020-04-21 NOTE — Telephone Encounter (Signed)
Patient's insurance will not cover this cream. Insurance is requesting a prior authorization.

## 2020-04-22 DIAGNOSIS — R221 Localized swelling, mass and lump, neck: Secondary | ICD-10-CM | POA: Diagnosis not present

## 2020-04-22 DIAGNOSIS — J309 Allergic rhinitis, unspecified: Secondary | ICD-10-CM | POA: Diagnosis not present

## 2020-04-28 NOTE — Telephone Encounter (Signed)
Pt wife called to check on PA

## 2020-04-29 ENCOUNTER — Telehealth: Payer: Self-pay | Admitting: Internal Medicine

## 2020-04-29 NOTE — Chronic Care Management (AMB) (Signed)
  Care Management   Note  04/29/2020 Name: KAIRO LAUBACHER MRN: [id number] DOB: [birth year]  IZEA LIVOLSI is a 44 y.o. year old male who is a primary care patient of Einar Pheasant, MD and is actively engaged with the care management team. I reached out to Celene Squibb by phone today to assist with re-scheduling a follow up visit with the Pharmacist  Follow up plan: Telephone appointment with care management team member scheduled for:06/24/2020  Noreene Larsson, [place], [place], [place] [address] Direct Dial: [phone] [email or web address]: [email or web address]

## 2020-04-30 ENCOUNTER — Other Ambulatory Visit: Payer: Self-pay

## 2020-05-01 ENCOUNTER — Other Ambulatory Visit: Payer: Self-pay

## 2020-05-01 ENCOUNTER — Ambulatory Visit (INDEPENDENT_AMBULATORY_CARE_PROVIDER_SITE_OTHER): Payer: 59

## 2020-05-01 ENCOUNTER — Ambulatory Visit (INDEPENDENT_AMBULATORY_CARE_PROVIDER_SITE_OTHER): Payer: 59 | Admitting: Internal Medicine

## 2020-05-01 ENCOUNTER — Encounter: Payer: Self-pay | Admitting: Internal Medicine

## 2020-05-01 VITALS — BP 118/84 | HR 103 | Temp 98.2°F | Ht 71.0 in | Wt 241.8 lb

## 2020-05-01 DIAGNOSIS — M546 Pain in thoracic spine: Secondary | ICD-10-CM | POA: Diagnosis not present

## 2020-05-01 DIAGNOSIS — N200 Calculus of kidney: Secondary | ICD-10-CM | POA: Diagnosis not present

## 2020-05-01 DIAGNOSIS — R1031 Right lower quadrant pain: Secondary | ICD-10-CM

## 2020-05-01 DIAGNOSIS — K5900 Constipation, unspecified: Secondary | ICD-10-CM | POA: Diagnosis not present

## 2020-05-01 DIAGNOSIS — R109 Unspecified abdominal pain: Secondary | ICD-10-CM

## 2020-05-01 MED ORDER — TRAMADOL HCL 50 MG PO TABS: 50.0000 mg | ORAL_TABLET | Freq: Two times a day (BID) | ORAL | 0 refills | Status: AC | PRN

## 2020-05-01 NOTE — Progress Notes (Signed)
Chief Complaint  Patient presents with  . Flank Pain   Acute visit  C/o right flank pain and b/l lateral back pain soreness 5/10 mid back worse since Tuesday night and noted w/in the last 1.5 weeks. Denies n/v. Also having RUQ, RLQ and epigastric ab pain. Pain work him up out of his sleep wife last night gave him ibuprofen 600 mg x 1 which helped and he has tried prevacid as well. No h/o kidney stones they know. Spicy food trigger pain    Review of Systems  Constitutional: Negative for weight loss.  Respiratory: Negative for shortness of breath.   Cardiovascular: Negative for chest pain.  Gastrointestinal: Positive for abdominal pain. Negative for nausea and vomiting.  Genitourinary: Positive for flank pain.  Musculoskeletal: Positive for back pain.   Past Medical History:  Diagnosis Date  . Asthma   . Diabetes mellitus without complication ([place])   . Hypertension   . Personality disorder ([place])    borderline   Past Surgical History:  Procedure Laterality Date  . ANTERIOR CRUCIATE LIGAMENT REPAIR Right 2006  . [place] SURGERY  2010  . COLONOSCOPY WITH PROPOFOL N/A 11/01/2019   Procedure: COLONOSCOPY WITH PROPOFOL;  Surgeon: Jonathon Bellows, MD;  Location: [facility] ENDOSCOPY;  Service: Gastroenterology;  Laterality: N/A;  . MENISCUS REPAIR Left 1996   Family History  Problem Relation Age of Onset  . Diabetes Mother   . Melanoma Mother   . Diabetes Paternal Aunt   . Heart attack Paternal Aunt   . Diabetes Paternal Uncle   . Diabetes Maternal Grandmother   . Diabetes Maternal Grandfather   . Heart disease Maternal Grandfather   . Heart attack Maternal Grandfather   . Heart attack Father   . Hypertension Father   . Diabetes Sister   . Hypertension Sister   . Kidney cancer Neg Hx   . Kidney disease Neg Hx   . Prostate cancer Neg Hx    Social History   Socioeconomic History  . Marital status: Married    Spouse name: Not on file  . Number of children: Not on file  . Years  of education: Not on file  . Highest education level: Not on file  Occupational History  . Not on file  Tobacco Use  . Smoking status: Former Smoker    Packs/day: 0.50    Years: 10.00    Pack years: 5.00    Types: Cigarettes    Quit date: 10/11/2002    Years since quitting: 17.5  . Smokeless tobacco: Former Systems developer    Types: Snuff    Quit date: 10/11/2009  Substance and Sexual Activity  . Alcohol use: Yes    Alcohol/week: 4.0 standard drinks    Types: 4 Glasses of wine per week    Comment: ocassional  . Drug use: No  . Sexual activity: Not on file  Other Topics Concern  . Not on file  Social History Narrative  . Not on file   Social Determinants of Health   Financial Resource Strain:   . Difficulty of Paying Living Expenses:   Food Insecurity:   . Worried About Charity fundraiser in the Last Year:   . Arboriculturist in the Last Year:   Transportation Needs:   . Film/video editor (Medical):   Marland Kitchen Lack of Transportation (Non-Medical):   Physical Activity:   . Days of Exercise per Week:   . Minutes of Exercise per Session:   Stress:   .  Feeling of Stress :   Social Connections:   . Frequency of Communication with Friends and Family:   . Frequency of Social Gatherings with Friends and Family:   . Attends Religious Services:   . Active Member of Clubs or Organizations:   . Attends Archivist Meetings:   Marland Kitchen Marital Status:   Intimate Partner Violence:   . Fear of Current or Ex-Partner:   . Emotionally Abused:   Marland Kitchen Physically Abused:   . Sexually Abused:    Current Meds  Medication Sig  . albuterol (PROVENTIL HFA;VENTOLIN HFA) 108 (90 BASE) MCG/ACT inhaler Inhale 1-2 puffs into the lungs every 6 (six) hours as needed.  Marland Kitchen amLODipine (NORVASC) 10 MG tablet TAKE 1 TABLET BY MOUTH DAILY  . azelastine (ASTELIN) 0.1 % nasal spray   . azelastine (OPTIVAR) 0.05 % ophthalmic solution Apply 1 drop to eye 2 (two) times daily.  . cholecalciferol (VITAMIN D3) 25 MCG  (1000 UNIT) tablet Take 1,000 Units by mouth daily.  Marland Kitchen FLOVENT HFA 110 MCG/ACT inhaler INHALE 2 PUFFS INTO THE LUNGS TWICE DAILY  . fluticasone (FLONASE) 50 MCG/ACT nasal spray USE 2 SPRAYS IN EACH NOSTRIL DAILY  . hydrochlorothiazide (MICROZIDE) 12.5 MG capsule TAKE 1 CAPSULE EVERY DAY  . Insulin Pen Needle (PEN NEEDLES) 30G X 5 MM MISC Inject 1 Applicatorful into the skin once a week. Use with trulicity pen  . JARDIANCE 10 MG TABS tablet TAKE 1 TABLET BY MOUTH DAILY BEFORE BREAKFAST  . levocetirizine (XYZAL) 5 MG tablet   . lisinopril (ZESTRIL) 5 MG tablet TAKE 1 TABLET BY MOUTH DAILY  . metFORMIN (GLUCOPHAGE-XR) 500 MG 24 hr tablet Take 2 tablets (1,000 mg total) by mouth 2 (two) times daily.  . montelukast (SINGULAIR) 10 MG tablet TAKE ONE TABLET EVERY MORNING  . Multiple Vitamin (MULTIVITAMIN) tablet Take 1 tablet by mouth daily.  . rosuvastatin (CRESTOR) 5 MG tablet Take one tablet q Monday, Wednesday and Friday.  . sertraline (ZOLOFT) 100 MG tablet TAKE ONE TABLET EVERY DAY  . sildenafil (REVATIO) 20 MG tablet Take 2-4 tablets two hours before intercouse on an empty stomach.  Do not take with nitrates.  . TRULICITY 1.5 UX/3.2GM SOPN Inject 1.5 mg into the skin once a week.    Allergies  Allergen Reactions  . Losartan     Severe Chest pain.   Recent Results (from the past 2160 hour(s))  PSA     Status: None   Collection Time: 04/03/20  8:39 AM  Result Value Ref Range   PSA 0.29 0.10 - 4.00 ng/mL    Comment: Test performed using Access Hybritech PSA Assay, a parmagnetic partical, chemiluminecent immunoassay.  Testosterone     Status: Abnormal   Collection Time: 04/03/20  8:39 AM  Result Value Ref Range   Testosterone 228.82 (L) 300.00 - 890.00 ng/dL  Basic metabolic panel     Status: Abnormal   Collection Time: 04/03/20  8:39 AM  Result Value Ref Range   Sodium 138 135 - 145 mEq/L   Potassium 4.4 3.5 - 5.1 mEq/L   Chloride 100 96 - 112 mEq/L   CO2 27 19 - 32 mEq/L    Glucose, Bld 119 (H) 70 - 99 mg/dL   BUN 19 6 - 23 mg/dL   Creatinine, Ser 1.14 0.40 - 1.50 mg/dL   GFR 69.23 >60.00 mL/min   Calcium 9.8 8.4 - 10.5 mg/dL  Lipid panel     Status: Abnormal   Collection Time: 04/03/20  8:39  AM  Result Value Ref Range   Cholesterol 151 0 - 200 mg/dL    Comment: ATP III Classification       Desirable:  < 200 mg/dL               Borderline High:  200 - 239 mg/dL          High:  > = 240 mg/dL   Triglycerides 134.0 0 - 149 mg/dL    Comment: Normal:  <150 mg/dLBorderline High:  150 - 199 mg/dL   HDL 35.60 (L) >39.00 mg/dL   VLDL 26.8 0.0 - 40.0 mg/dL   LDL Cholesterol 89 0 - 99 mg/dL   Total CHOL/HDL Ratio 4     Comment:                Men          Women1/2 Average Risk     3.4          3.3Average Risk          5.0          4.42X Average Risk          9.6          7.13X Average Risk          15.0          11.0                       NonHDL 115.67     Comment: NOTE:  Non-HDL goal should be 30 mg/dL higher than patient's LDL goal (i.e. LDL goal of < 70 mg/dL, would have non-HDL goal of < 100 mg/dL)  Hepatic function panel     Status: None   Collection Time: 04/03/20  8:39 AM  Result Value Ref Range   Total Bilirubin 0.6 0.2 - 1.2 mg/dL   Bilirubin, Direct 0.1 0.0 - 0.3 mg/dL   Alkaline Phosphatase 55 39 - 117 U/L   AST 26 0 - 37 U/L   ALT 49 0 - 53 U/L   Total Protein 7.1 6.0 - 8.3 g/dL   Albumin 4.7 3.5 - 5.2 g/dL  Hemoglobin A1c     Status: None   Collection Time: 04/03/20  8:39 AM  Result Value Ref Range   Hgb A1c MFr Bld 6.5 4.6 - 6.5 %    Comment: Glycemic Control Guidelines for People with Diabetes:Non Diabetic:  <6%Goal of Therapy: <7%Additional Action Suggested:  >8%   TSH     Status: None   Collection Time: 04/03/20  8:39 AM  Result Value Ref Range   TSH 1.64 0.35 - 4.50 uIU/mL   Objective  Body mass index is 33.72 kg/m. Wt Readings from Last 3 Encounters:  05/01/20 (!) 241 lb 12.8 oz (109.7 kg)  04/03/20 239 lb (108.4 kg)  01/01/20 253 lb  (114.8 kg)   Temp Readings from Last 3 Encounters:  05/01/20 98.2 F (36.8 C) (Oral)  04/03/20 (!) 96.9 F (36.1 C)  01/01/20 (!) 97.1 F (36.2 C)   BP Readings from Last 3 Encounters:  05/01/20 118/84  04/03/20 124/80  01/01/20 124/80   Pulse Readings from Last 3 Encounters:  05/01/20 103  04/03/20 80  01/01/20 80    Physical Exam Vitals and nursing note reviewed.  Constitutional:      Appearance: Normal appearance. He is well-developed and well-groomed.  HENT:     Head: Normocephalic and atraumatic.  Eyes:     Conjunctiva/sclera:  Conjunctivae normal.     Pupils: Pupils are equal, round, and reactive to light.  Cardiovascular:     Rate and Rhythm: Normal rate and regular rhythm.     Heart sounds: Normal heart sounds. No murmur heard.   Pulmonary:     Effort: Pulmonary effort is normal.     Breath sounds: Normal breath sounds.  Abdominal:     Tenderness: There is abdominal tenderness in the right upper quadrant, right lower quadrant and epigastric area. There is no right CVA tenderness, left CVA tenderness, guarding or rebound.    Skin:    General: Skin is warm and dry.  Neurological:     General: No focal deficit present.     Mental Status: He is alert and oriented to person, place, and time. Mental status is at baseline.     Gait: Gait normal.  Psychiatric:        Attention and Perception: Attention and perception normal.        Mood and Affect: Mood and affect normal.        Speech: Speech normal.        Behavior: Behavior normal. Behavior is cooperative.        Thought Content: Thought content normal.        Cognition and Memory: Cognition and memory normal.        Judgment: Judgment normal.     Assessment  Plan  Flank pain b/l, epigastric pain RUQ, RLQ, RMQ DDx gallstones, kidney stones vs MSK  - Plan: Urinalysis, Routine w reflex microscopic, Urine Culture, Comprehensive metabolic panel, CBC w/Diff, DG Abd 1 View, traMADol (ULTRAM) 50 MG tablet bid  prn If kidney stone urology  If GB Dr.Byrnett   Acute bilateral thoracic back pain - Plan: traMADol (ULTRAM) 50 MG tablet bid prn with colace Provider: Dr. Olivia Mackie McLean-Scocuzza-Internal Medicine

## 2020-05-01 NOTE — Progress Notes (Signed)
Patient presenting with right sided flank pain. On set of this past Tuesday night. Pain radiates from right underneath the right armpit down into the abdomen. Patient also having back pain.   States he uses new equipment at work. The pain comes and goes. Rated 5/10.

## 2020-05-01 NOTE — Patient Instructions (Signed)
Take colace as needed for constipation with tramadol for pain  Flank Pain, Adult Flank pain is pain that is located on the side of the body between the upper abdomen and the back. This area is called the flank. The pain may occur over a short period of time (acute), or it may be long-term or recurring (chronic). It may be mild or severe. Flank pain can be caused by many things, including:  Muscle soreness or injury.  Kidney stones or kidney disease.  Stress.  A disease of the spine (vertebral disk disease).  A lung infection (pneumonia).  Fluid around the lungs (pulmonary edema).  A skin rash caused by the chickenpox virus (shingles).  Tumors that affect the back of the abdomen.  Gallbladder disease. Follow these instructions at home:   Drink enough fluid to keep your urine clear or pale yellow.  Rest as told by your health care provider.  Take over-the-counter and prescription medicines only as told by your health care provider.  Keep a journal to track what has caused your flank pain and what has made it feel better.  Keep all follow-up visits as told by your health care provider. This is important. Contact a health care provider if:  Your pain is not controlled with medicine.  You have new symptoms.  Your pain gets worse.  You have a fever.  Your symptoms last longer than 2-3 days.  You have trouble urinating or you are urinating very frequently. Get help right away if:  You have trouble breathing or you are short of breath.  Your abdomen hurts or it is swollen or red.  You have nausea or vomiting.  You feel faint or you pass out.  You have blood in your urine. Summary  Flank pain is pain that is located on the side of the body between the upper abdomen and the back.  The pain may occur over a short period of time (acute), or it may be long-term or recurring (chronic). It may be mild or severe.  Flank pain can be caused by many things.  Contact your  health care provider if your symptoms get worse or they last longer than 2-3 days. This information is not intended to replace advice given to you by your health care provider. Make sure you discuss any questions you have with your health care provider. Document Revised: 09/09/2017 Document Reviewed: 12/10/2016 Elsevier Patient Education  [place].  Abdominal Pain, Adult Pain in the abdomen (abdominal pain) can be caused by many things. Often, abdominal pain is not serious and it gets better with no treatment or by being treated at home. However, sometimes abdominal pain is serious. Your health care provider will ask questions about your medical history and do a physical exam to try to determine the cause of your abdominal pain. Follow these instructions at home:  Medicines  Take over-the-counter and prescription medicines only as told by your health care provider.  Do not take a laxative unless told by your health care provider. General instructions  Watch your condition for any changes.  Drink enough fluid to keep your urine pale yellow.  Keep all follow-up visits as told by your health care provider. This is important. Contact a health care provider if:  Your abdominal pain changes or gets worse.  You are not hungry or you lose weight without trying.  You are constipated or have diarrhea for more than 2-3 days.  You have pain when you urinate or have a  bowel movement.  Your abdominal pain wakes you up at night.  Your pain gets worse with meals, after eating, or with certain foods.  You are vomiting and cannot keep anything down.  You have a fever.  You have blood in your urine. Get help right away if:  Your pain does not go away as soon as your health care provider told you to expect.  You cannot stop vomiting.  Your pain is only in areas of the abdomen, such as the right side or the left lower portion of the abdomen. Pain on the right side could be caused  by appendicitis.  You have bloody or black stools, or stools that look like tar.  You have severe pain, cramping, or bloating in your abdomen.  You have signs of dehydration, such as: ? Dark urine, very little urine, or no urine. ? Cracked lips. ? Dry mouth. ? Sunken eyes. ? Sleepiness. ? Weakness.  You have trouble breathing or chest pain. Summary  Often, abdominal pain is not serious and it gets better with no treatment or by being treated at home. However, sometimes abdominal pain is serious.  Watch your condition for any changes.  Take over-the-counter and prescription medicines only as told by your health care provider.  Contact a health care provider if your abdominal pain changes or gets worse.  Get help right away if you have severe pain, cramping, or bloating in your abdomen. This information is not intended to replace advice given to you by your health care provider. Make sure you discuss any questions you have with your health care provider. Document Revised: 02/05/2019 Document Reviewed: 02/05/2019 Elsevier Patient Education  [place].

## 2020-05-02 ENCOUNTER — Encounter: Payer: Self-pay | Admitting: Emergency Medicine

## 2020-05-02 ENCOUNTER — Other Ambulatory Visit: Payer: Self-pay

## 2020-05-02 ENCOUNTER — Ambulatory Visit
Admission: RE | Admit: 2020-05-02 | Discharge: 2020-05-02 | Disposition: A | Discharge status: Home / Self Care | Payer: 59 | Source: Ambulatory Visit | Attending: Internal Medicine | Admitting: Internal Medicine

## 2020-05-02 ENCOUNTER — Telehealth: Payer: Self-pay | Admitting: Internal Medicine

## 2020-05-02 ENCOUNTER — Encounter: Payer: Self-pay | Admitting: Internal Medicine

## 2020-05-02 ENCOUNTER — Emergency Department
Admission: EM | Admit: 2020-05-02 | Discharge: 2020-05-03 | Disposition: A | Discharge status: Home / Self Care | Payer: 59 | Attending: Emergency Medicine | Admitting: Emergency Medicine

## 2020-05-02 DIAGNOSIS — Z7984 Long term (current) use of oral hypoglycemic drugs: Secondary | ICD-10-CM | POA: Insufficient documentation

## 2020-05-02 DIAGNOSIS — I10 Essential (primary) hypertension: Secondary | ICD-10-CM | POA: Insufficient documentation

## 2020-05-02 DIAGNOSIS — J45909 Unspecified asthma, uncomplicated: Secondary | ICD-10-CM | POA: Diagnosis not present

## 2020-05-02 DIAGNOSIS — E119 Type 2 diabetes mellitus without complications: Secondary | ICD-10-CM | POA: Diagnosis not present

## 2020-05-02 DIAGNOSIS — R1013 Epigastric pain: Secondary | ICD-10-CM | POA: Diagnosis not present

## 2020-05-02 DIAGNOSIS — K5900 Constipation, unspecified: Secondary | ICD-10-CM

## 2020-05-02 DIAGNOSIS — R1031 Right lower quadrant pain: Secondary | ICD-10-CM | POA: Insufficient documentation

## 2020-05-02 DIAGNOSIS — K859 Acute pancreatitis without necrosis or infection, unspecified: Secondary | ICD-10-CM | POA: Diagnosis not present

## 2020-05-02 DIAGNOSIS — N200 Calculus of kidney: Secondary | ICD-10-CM

## 2020-05-02 DIAGNOSIS — Z87891 Personal history of nicotine dependence: Secondary | ICD-10-CM | POA: Insufficient documentation

## 2020-05-02 DIAGNOSIS — R109 Unspecified abdominal pain: Secondary | ICD-10-CM

## 2020-05-02 DIAGNOSIS — Z79899 Other long term (current) drug therapy: Secondary | ICD-10-CM | POA: Insufficient documentation

## 2020-05-02 DIAGNOSIS — M546 Pain in thoracic spine: Secondary | ICD-10-CM

## 2020-05-02 DIAGNOSIS — K8689 Other specified diseases of pancreas: Secondary | ICD-10-CM | POA: Diagnosis not present

## 2020-05-02 DIAGNOSIS — Z7951 Long term (current) use of inhaled steroids: Secondary | ICD-10-CM | POA: Insufficient documentation

## 2020-05-02 DIAGNOSIS — K4020 Bilateral inguinal hernia, without obstruction or gangrene, not specified as recurrent: Secondary | ICD-10-CM | POA: Diagnosis not present

## 2020-05-02 DIAGNOSIS — K760 Fatty (change of) liver, not elsewhere classified: Secondary | ICD-10-CM | POA: Diagnosis not present

## 2020-05-02 LAB — COMPREHENSIVE METABOLIC PANEL
ALT: 33 U/L (ref 0–53)
ALT: 31 U/L (ref 0–44)
AST: 17 U/L (ref 0–37)
AST: 19 U/L (ref 15–41)
Albumin: 4.5 g/dL (ref 3.5–5.2)
Albumin: 4.3 g/dL (ref 3.5–5.0)
Alkaline Phosphatase: 69 U/L (ref 39–117)
Alkaline Phosphatase: 62 U/L (ref 38–126)
Anion gap: 9 (ref 5–15)
BUN: 21 mg/dL (ref 6–23)
BUN: 20 mg/dL (ref 6–20)
CO2: 26 mEq/L (ref 19–32)
CO2: 25 mmol/L (ref 22–32)
Calcium: 9.5 mg/dL (ref 8.4–10.5)
Calcium: 9.1 mg/dL (ref 8.9–10.3)
Chloride: 101 mEq/L (ref 96–112)
Chloride: 101 mmol/L (ref 98–111)
Creatinine, Ser: 1.23 mg/dL (ref 0.40–1.50)
Creatinine, Ser: 0.96 mg/dL (ref 0.61–1.24)
GFR calc Af Amer: >60 mL/min (ref >60)
GFR calc non Af Amer: >60 mL/min (ref >60)
GFR: 63.39 mL/min (ref >60.00)
Glucose, Bld: 191 mg/dL — ABNORMAL HIGH (ref 70–99)
Glucose, Bld: 130 mg/dL — ABNORMAL HIGH (ref 70–99)
Potassium: 3.8 mEq/L (ref 3.5–5.1)
Potassium: 3.7 mmol/L (ref 3.5–5.1)
Sodium: 136 mEq/L (ref 135–145)
Sodium: 135 mmol/L (ref 135–145)
Total Bilirubin: 0.5 mg/dL (ref 0.2–1.2)
Total Bilirubin: 0.8 mg/dL (ref 0.3–1.2)
Total Protein: 7.2 g/dL (ref 6.0–8.3)
Total Protein: 7.6 g/dL (ref 6.5–8.1)

## 2020-05-02 LAB — CBC WITH DIFFERENTIAL/PLATELET
Basophils Absolute: 0 10*3/uL (ref 0.0–0.1)
Basophils Relative: 0.2 % (ref 0.0–3.0)
Eosinophils Absolute: 0.7 10*3/uL (ref 0.0–0.7)
Eosinophils Relative: 5.3 % — ABNORMAL HIGH (ref 0.0–5.0)
HCT: 47 % (ref 39.0–52.0)
Hemoglobin: 16.2 g/dL (ref 13.0–17.0)
Lymphocytes Relative: 17.1 % (ref 12.0–46.0)
Lymphs Abs: 2.4 10*3/uL (ref 0.7–4.0)
MCHC: 34.4 g/dL (ref 30.0–36.0)
MCV: 88.8 fl (ref 78.0–100.0)
Monocytes Absolute: 1.3 10*3/uL — ABNORMAL HIGH (ref 0.1–1.0)
Monocytes Relative: 9.1 % (ref 3.0–12.0)
Neutro Abs: 9.5 10*3/uL — ABNORMAL HIGH (ref 1.4–7.7)
Neutrophils Relative %: 68.3 % (ref 43.0–77.0)
Platelets: 247 10*3/uL (ref 150.0–400.0)
RBC: 5.29 Mil/uL (ref 4.22–5.81)
RDW: 13.4 % (ref 11.5–15.5)
WBC: 14 10*3/uL — ABNORMAL HIGH (ref 4.0–10.5)

## 2020-05-02 LAB — LIPASE, BLOOD: Lipase: 61 U/L — ABNORMAL HIGH (ref 11–51)

## 2020-05-02 LAB — CBC
HCT: 44.9 % (ref 39.0–52.0)
Hemoglobin: 15.7 g/dL (ref 13.0–17.0)
MCH: 30.5 pg (ref 26.0–34.0)
MCHC: 35 g/dL (ref 30.0–36.0)
MCV: 87.4 fL (ref 80.0–100.0)
Platelets: 246 10*3/uL (ref 150–400)
RBC: 5.14 MIL/uL (ref 4.22–5.81)
RDW: 12.6 % (ref 11.5–15.5)
WBC: 9.5 10*3/uL (ref 4.0–10.5)
nRBC: 0 % (ref 0.0–0.2)

## 2020-05-02 LAB — URINALYSIS, COMPLETE (UACMP) WITH MICROSCOPIC
Bacteria, UA: NONE SEEN
Bilirubin Urine: NEGATIVE
Glucose, UA: >=500 mg/dL — AB
Hgb urine dipstick: NEGATIVE
Ketones, ur: NEGATIVE mg/dL
Leukocytes,Ua: NEGATIVE
Nitrite: NEGATIVE
Protein, ur: NEGATIVE mg/dL
Specific Gravity, Urine: 1.028 (ref 1.005–1.030)
Squamous Epithelial / HPF: NONE SEEN (ref 0–5)
pH: 6 (ref 5.0–8.0)

## 2020-05-02 LAB — URINALYSIS, ROUTINE W REFLEX MICROSCOPIC
Bilirubin Urine: NEGATIVE
Hgb urine dipstick: NEGATIVE
Ketones, ur: NEGATIVE
Leukocytes,Ua: NEGATIVE
Nitrite: NEGATIVE
Protein, ur: NEGATIVE
Specific Gravity, Urine: 1.037 — ABNORMAL HIGH (ref 1.001–1.03)
pH: <=5 (ref 5.0–8.0)

## 2020-05-02 LAB — URINE CULTURE
MICRO NUMBER:: 10737503
Result:: NO GROWTH
SPECIMEN QUALITY:: ADEQUATE

## 2020-05-02 MED ORDER — KETOROLAC TROMETHAMINE 30 MG/ML IJ SOLN: 15.0000 mg | Freq: Once | INTRAMUSCULAR | Status: DC

## 2020-05-02 MED ORDER — LACTATED RINGERS IV BOLUS: 1000.0000 mL | Freq: Once | INTRAVENOUS | Status: DC

## 2020-05-02 MED ORDER — MORPHINE SULFATE (PF) 4 MG/ML IV SOLN: 4.0000 mg | Freq: Once | INTRAVENOUS | Status: DC

## 2020-05-02 MED ORDER — ONDANSETRON HCL 4 MG/2ML IJ SOLN: 4.0000 mg | Freq: Once | INTRAMUSCULAR | Status: DC

## 2020-05-02 NOTE — ED Triage Notes (Signed)
First RN Note: Pt presents to ED via POV, this RN spoke with provider from [place], per provider at [facility] pt with acute pancreatitis on CT scan and PCP sent for IVF and pain control.

## 2020-05-02 NOTE — Addendum Note (Signed)
Addended by: Orland Mustard on: 05/02/2020 01:17 PM   Modules accepted: Orders

## 2020-05-02 NOTE — Telephone Encounter (Signed)
His PA for the testosterone gel was denied because he has not tried and failed any of the preferred formulary drugs. These are the formulary products listed: testosterone gel (except authorized generics for Testim and Vogelxo), testosterone solution, Androderm, Natesto. Are any of these anything he can be switched to? I can complete appeal if not, just need reason for appeal

## 2020-05-02 NOTE — Telephone Encounter (Signed)
Informed pt to go to [facility] for IVF and pain control +pancreatitis and fatty liver

## 2020-05-02 NOTE — ED Triage Notes (Addendum)
Pt here for pancreatitis.  Had outpatient CT and was sent for pain control/fluids.  No vomiting. No fevers. VSS.  Pain to right back and radiates to RLQ. Drinks couple drinks about every other night.

## 2020-05-02 NOTE — Telephone Encounter (Signed)
  [facility] radiology called Stat Report      IMPRESSION: 1. Negative for hydronephrosis or ureteral stone. 2. Indistinct appearing pancreatic head and uncinate process with surrounding soft tissue stranding, consistent with acute pancreatitis. No gross focal fluid collections allowing for absence of intravenous contrast. 3. Hepatic steatosis.  These results will be called to the ordering clinician or representative by the Radiologist Assistant, and communication documented in the PACS or [employer].IMPRESSION: 1. Negative for hydronephrosis or ureteral stone. 2. Indistinct appearing pancreatic head and uncinate process with surrounding soft tissue stranding, consistent with acute pancreatitis. No gross focal fluid collections allowing for absence of intravenous contrast. 3. Hepatic steatosis.

## 2020-05-02 NOTE — Telephone Encounter (Signed)
Catie, can you help with this?  I changed him from testosterone injections to androgel.  If I write rx for testosterone gel - it still gives me testim and androgel in parenthesis - so I assume they will not cover this either.  Do you know how I can write the gel with equivalent dose to androgel I prescribed - to get authorization.  Thanks

## 2020-05-03 ENCOUNTER — Emergency Department: Payer: 59

## 2020-05-03 DIAGNOSIS — K760 Fatty (change of) liver, not elsewhere classified: Secondary | ICD-10-CM | POA: Diagnosis not present

## 2020-05-03 NOTE — ED Provider Notes (Signed)
[facility] Emergency Department Provider Note  ____________________________________________  Time seen: Approximately 12:20 AM  I have reviewed the triage vital signs and the nursing notes.   HISTORY  Chief Complaint Abdominal Pain   HPI Francisco Jackson is a 44 y.o. male with a history of asthma, diabetes, hypertension who presents for evaluation of b/l flank pain.   Patient reports that the pain started about 4 days ago.  The pain has been constant, located mostly on bilateral flank area but initially also in the epigastric area.  The pain was well controlled at home on some ibuprofen.  He reports that the pain is almost fully resolved at this time and only describes it as a 2 out of 10.  However he did see his primary care doctor earlier today who sent him for a CT scan.  The CT is concerning for pancreatitis and therefore he was instructed to come to the emergency room for further evaluation.  Patient denies having a cholecystectomy in the past.  He does report drinking alcohol, 2 drinks every other day.  He denies prior history of pancreatitis or any drug use.  No nausea or vomiting, no fever or chills, no chest pain or shortness of breath.  Past Medical History:  Diagnosis Date  . Asthma   . Diabetes mellitus without complication ([place])   . Hypertension   . Personality disorder [facility])    borderline    Patient Active Problem List   Diagnosis Date Noted  . Hepatic steatosis 05/02/2020  . Pancreatitis, acute 05/02/2020  . Testosterone deficiency 04/16/2020  . Healthcare maintenance 11/02/2019  . ETOH abuse 06/24/2019  . Mild depression ([place]) 06/20/2019  . BMI 33.0-33.9,adult 06/04/2019  . Neck fullness 06/04/2019  . Witnessed apneic spells 01/24/2018  . Pneumonia 01/11/2018  . Abnormal liver function tests 08/15/2017  . Erectile dysfunction 06/20/2017  . Chest pain 12/20/2016  . Stress 10/24/2016  . Essential hypertension 10/24/2016  . Diabetes  mellitus ([place]) 08/01/2016  . Hypercholesterolemia 08/01/2016  . Frequent episodes of bronchitis 08/01/2016    Past Surgical History:  Procedure Laterality Date  . ANTERIOR CRUCIATE LIGAMENT REPAIR Right 2006  . [place] SURGERY  2010  . COLONOSCOPY WITH PROPOFOL N/A 11/01/2019   Procedure: COLONOSCOPY WITH PROPOFOL;  Surgeon: Jonathon Bellows, MD;  Location: [facility] ENDOSCOPY;  Service: Gastroenterology;  Laterality: N/A;  . MENISCUS REPAIR Left 1996    Prior to Admission medications   Medication Sig Start Date End Date Taking? Authorizing Provider  albuterol (PROVENTIL HFA;VENTOLIN HFA) 108 (90 BASE) MCG/ACT inhaler Inhale 1-2 puffs into the lungs every 6 (six) hours as needed. 09/22/15   Mar Daring, PA-C  amLODipine (NORVASC) 10 MG tablet TAKE 1 TABLET BY MOUTH DAILY 04/04/20   Einar Pheasant, MD  azelastine (ASTELIN) 0.1 % nasal spray  06/26/18   [provider]  azelastine (OPTIVAR) 0.05 % ophthalmic solution Apply 1 drop to eye 2 (two) times daily. 08/03/19   [provider]  cholecalciferol (VITAMIN D3) 25 MCG (1000 UNIT) tablet Take 1,000 Units by mouth daily.    [provider]  FLOVENT HFA 110 MCG/ACT inhaler INHALE 2 PUFFS INTO THE LUNGS TWICE DAILY 05/08/18   Einar Pheasant, MD  fluticasone [facility]) 50 MCG/ACT nasal spray USE 2 SPRAYS IN EACH NOSTRIL DAILY 12/05/19   Einar Pheasant, MD  hydrochlorothiazide (MICROZIDE) 12.5 MG capsule TAKE 1 CAPSULE EVERY DAY 03/04/20   Einar Pheasant, MD  Insulin Pen Needle (PEN NEEDLES) 30G X 5  MM MISC Inject 1 Applicatorful into the skin once a week. Use with trulicity pen 4/58/09   Guse, Jacquelynn Cree, FNP  JARDIANCE 10 MG TABS tablet TAKE 1 TABLET BY MOUTH DAILY BEFORE BREAKFAST 03/04/20   Einar Pheasant, MD  levocetirizine (XYZAL) 5 MG tablet  06/08/18   [provider]  lisinopril (ZESTRIL) 5 MG tablet TAKE 1 TABLET BY MOUTH DAILY 04/04/20   Einar Pheasant, MD  metFORMIN (GLUCOPHAGE-XR) 500 MG 24 hr  tablet Take 2 tablets (1,000 mg total) by mouth 2 (two) times daily. 04/11/20   Einar Pheasant, MD  montelukast (SINGULAIR) 10 MG tablet TAKE ONE TABLET EVERY MORNING 01/03/20   Einar Pheasant, MD  Multiple Vitamin (MULTIVITAMIN) tablet Take 1 tablet by mouth daily.    [provider]  rosuvastatin (CRESTOR) 5 MG tablet Take one tablet q Monday, Wednesday and Friday. 01/01/20   Einar Pheasant, MD  sertraline (ZOLOFT) 100 MG tablet TAKE ONE TABLET EVERY DAY 04/04/20   Einar Pheasant, MD  sildenafil (REVATIO) 20 MG tablet Take 2-4 tablets two hours before intercouse on an empty stomach.  Do not take with nitrates. 05/30/19   Einar Pheasant, MD  traMADol (ULTRAM) 50 MG tablet Take 1 tablet (50 mg total) by mouth 2 (two) times daily as needed for up to 5 days. 05/01/20 05/06/20  McLean-Scocuzza, Nino Glow, MD  TRULICITY 1.5 XI/3.3AS SOPN Inject 1.5 mg into the skin once a week.  05/04/19   [provider]  testosterone (ANDROGEL) 50 MG/5GM (1%) GEL Place 5 g onto the skin daily. Patient not taking: Reported on 05/01/2020 04/16/20 05/02/20  Einar Pheasant, MD    Allergies Losartan  Family History  Problem Relation Age of Onset  . Diabetes Mother   . Melanoma Mother   . Diabetes Paternal Aunt   . Heart attack Paternal Aunt   . Diabetes Paternal Uncle   . Diabetes Maternal Grandmother   . Diabetes Maternal Grandfather   . Heart disease Maternal Grandfather   . Heart attack Maternal Grandfather   . Heart attack Father   . Hypertension Father   . Diabetes Sister   . Hypertension Sister   . Kidney cancer Neg Hx   . Kidney disease Neg Hx   . Prostate cancer Neg Hx     Social History Social History   Tobacco Use  . Smoking status: Former Smoker    Packs/day: 0.50    Years: 10.00    Pack years: 5.00    Types: Cigarettes    Quit date: 10/11/2002    Years since quitting: 17.5  . Smokeless tobacco: Former Systems developer    Types: Snuff    Quit date: 10/11/2009  Substance Use Topics  .  Alcohol use: Yes    Alcohol/week: 4.0 standard drinks    Types: 4 Glasses of wine per week    Comment: ocassional  . Drug use: No    Review of Systems  Constitutional: Negative for fever. Eyes: Negative for visual changes. ENT: Negative for sore throat. Neck: No neck pain  Cardiovascular: Negative for chest pain. Respiratory: Negative for shortness of breath. Gastrointestinal: Negative for abdominal pain, vomiting or diarrhea. Genitourinary: Negative for dysuria. Musculoskeletal: Negative for back pain. + b/l flank pain Skin: Negative for rash. Neurological: Negative for headaches, weakness or numbness. Psych: No SI or HI  ____________________________________________   PHYSICAL EXAM:  VITAL SIGNS: ED Triage Vitals  Enc Vitals Group     BP 05/02/20 1744 (!) 116/99     Pulse Rate  05/02/20 1744 82     Resp 05/02/20 1744 16     Temp 05/02/20 1744 98.3 F (36.8 C)     Temp Source 05/02/20 1744 Oral     SpO2 05/02/20 1744 95 %     Weight 05/02/20 1740 (!) 240 lb (108.9 kg)     Height 05/02/20 1740 5\' 11"  (1.803 m)     Head Circumference --      Peak Flow --      Pain Score 05/02/20 1740 7     Pain Loc --      Pain Edu? --      Excl. in [place]? --     Constitutional: Alert and oriented. Well appearing and in no apparent distress. HEENT:      Head: Normocephalic and atraumatic.         Eyes: Conjunctivae are normal. Sclera is non-icteric.       Mouth/Throat: Mucous membranes are moist.       Neck: Supple with no signs of meningismus. Cardiovascular: Regular rate and rhythm. No murmurs, gallops, or rubs.  Respiratory: Normal respiratory effort. Lungs are clear to auscultation bilaterally.  Gastrointestinal: Soft, non tender, and non distended with positive bowel sounds. No rebound or guarding. Genitourinary: No CVA tenderness. Musculoskeletal: No edema, cyanosis, or erythema of extremities. Neurologic: Normal speech and language. Face is symmetric. Moving all extremities.  No gross focal neurologic deficits are appreciated. Skin: Skin is warm, dry and intact. No rash noted. Psychiatric: Mood and affect are normal. Speech and behavior are normal.  ____________________________________________   LABS (all labs ordered are listed, but only abnormal results are displayed)  Labs Reviewed  LIPASE, BLOOD - Abnormal; Notable for the following components:      Result Value   Lipase 61 (*)    All other components within normal limits  COMPREHENSIVE METABOLIC PANEL - Abnormal; Notable for the following components:   Glucose, Bld 130 (*)    All other components within normal limits  URINALYSIS, COMPLETE (UACMP) WITH MICROSCOPIC - Abnormal; Notable for the following components:   Color, Urine YELLOW (*)    APPearance CLEAR (*)    Glucose, UA >=500 (*)    All other components within normal limits  CBC   ____________________________________________  EKG  none  ____________________________________________  RADIOLOGY  I have personally reviewed the images performed during this visit and I agree with the Radiologist's read.   Interpretation by Radiologist:  CT Abdomen Pelvis Wo Contrast  Result Date: 05/02/2020 CLINICAL DATA:  Severe abdominal pain right flank pain EXAM: CT ABDOMEN AND PELVIS WITHOUT CONTRAST TECHNIQUE: Multidetector CT imaging of the abdomen and pelvis was performed following the standard protocol without IV contrast. COMPARISON:  05/01/2020 radiograph FINDINGS: Lower chest: Lung bases demonstrate no acute consolidation or pleural effusion. Normal cardiac size. Hepatobiliary: Hepatic steatosis with probable fat sparing near the gallbladder fossa. No calcified gallstone or biliary dilatation. Pancreas: Indistinct appearing pancreatic head and uncinate process with soft tissue stranding. No ductal dilatation. Spleen: Normal in size without focal abnormality. Adrenals/Urinary Tract: Adrenal glands are unremarkable. Kidneys are normal, without renal  calculi, focal lesion, or hydronephrosis. Bladder is unremarkable. Stomach/Bowel: Stomach is within normal limits. Appendix appears normal. No evidence of bowel wall thickening, distention, or inflammatory changes. Vascular/Lymphatic: No significant vascular findings are present. No enlarged abdominal or pelvic lymph nodes. Reproductive: Prostate is unremarkable. Other: Negative for free air or free fluid. Small right greater than left fat containing inguinal hernias. Musculoskeletal: No acute or significant osseous  findings. IMPRESSION: 1. Negative for hydronephrosis or ureteral stone. 2. Indistinct appearing pancreatic head and uncinate process with surrounding soft tissue stranding, consistent with acute pancreatitis. No gross focal fluid collections allowing for absence of intravenous contrast. 3. Hepatic steatosis. These results will be called to the ordering clinician or representative by the Radiologist Assistant, and communication documented in the PACS or [employer]. Electronically Signed   By: Donavan Foil M.D.   On: 05/02/2020 15:36   US ABDOMEN LIMITED RUQ  Result Date: 05/03/2020 CLINICAL DATA:  Pancreatitis, right flank pain EXAM: ULTRASOUND ABDOMEN LIMITED RIGHT UPPER QUADRANT COMPARISON:  05/02/2020 FINDINGS: Gallbladder: No gallstones or wall thickening visualized. No sonographic Murphy sign noted by sonographer. Common bile duct: Diameter: 3 mm Liver: Diffuse increased liver echotexture consistent with hepatic steatosis. No focal abnormality. Portal vein is patent on color Doppler imaging with normal direction of blood flow towards the liver. Other: None. IMPRESSION: 1. Diffuse hepatic steatosis. 2. No evidence of cholelithiasis or cholecystitis. Electronically Signed   By: Randa Ngo M.D.   On: 05/03/2020 00:56     ____________________________________________   PROCEDURES  Procedure(s) performed: none Procedures Critical Care performed:   None ____________________________________________   INITIAL IMPRESSION / ASSESSMENT AND PLAN / ED COURSE  44 y.o. male with a history of asthma, diabetes, hypertension who presents for evaluation of bilateral flank pain and a CT scan positive for pancreatitis done by his primary care doctor earlier today.  Patient is extremely well-appearing in no distress with normal vital signs, abdomen is soft with no tenderness, no bilateral CVA tenderness.  CT from outpatient reviewed showing mild inflammation of the pancreas.  Patient reports a 2 out of 10 pain and declines any pain medication at this time.  Denies any nausea and vomiting.  Has been able to tolerate p.o. at home with no difficulty.  He is here because his primary care doctor told him he had to come in.  His lipase minimally elevated at 61.  No significant electrolyte derangements.  Mild elevated blood glucose with no evidence of DKA.  LFTs and T bili are normal.  No leukocytosis.  We will send patient for right upper quadrant ultrasound to evaluate for any evidence of gallstones which may require outpatient evaluation by surgery for possible cholecystectomy to avoid any further episodes of pancreatitis.  Explained to the patient that alcohol consumption can cause this.  Also explained that he will need follow-up imaging once the symptoms have been fully resolved for a few weeks to make sure there are no masses in the pancreas who could have caused an obstruction and therefore this bout of pancreatitis.  He has no family history of malignancy.  History is gathered from patient and his wife was at bedside.  Care discussed with both of them.  Old medical records reviewed.     _________________________ 1:09 AM on 05/03/2020 -----------------------------------------  [place] negative for cholelithiasis or cholecystitis, confirmed by radiology.  Patient remains well-appearing not requiring any pain medicine.  Discussed bland diet, follow-up with PCP, and my  standard return precautions with him and his wife.  _____________________________________________ Please note:  Patient was evaluated in Emergency Department today for the symptoms described in the history of present illness. Patient was evaluated in the context of the global COVID-19 pandemic, which necessitated consideration that the patient might be at risk for infection with the SARS-CoV-2 virus that causes COVID-19. Institutional protocols and algorithms that pertain to the evaluation of patients at risk for COVID-19 are in a  state of rapid change based on information released by regulatory bodies including the CDC and federal and state organizations. These policies and algorithms were followed during the patient's care in the ED.  Some ED evaluations and interventions may be delayed as a result of limited staffing during the pandemic.   [place] Controlled Substance Database was reviewed by me. ____________________________________________   FINAL CLINICAL IMPRESSION(S) / ED DIAGNOSES   Final diagnoses:  Pancreatitis      NEW MEDICATIONS STARTED DURING THIS VISIT:  ED Discharge Orders    None       Note:  This document was prepared using Dragon voice recognition software and may include unintentional dictation errors.    Alfred Levins, [place], MD 05/03/20 [id number]

## 2020-05-03 NOTE — Telephone Encounter (Signed)
I want to make sure this is the correct way to write the medication:  axiron 30mg /act TD SOLN.  Two pumps apply to skin.  I am not sure how to write and give quantity.  Can you help me with this.

## 2020-05-03 NOTE — Discharge Instructions (Signed)
Bland diet until symptoms are fully resolved. No alcohol until 48hrs of no symptoms. Follow up with your doctor in 2 weeks for repeat imaging of your pancreas to rule out malignancy. Take ibuprofen or tylenol for pain control.

## 2020-05-05 ENCOUNTER — Ambulatory Visit: Payer: 59 | Admitting: Internal Medicine

## 2020-05-05 MED ORDER — TESTOSTERONE 30 MG/ACT TD SOLN: 2.0000 | Freq: Every day | TRANSDERMAL | 1 refills | Status: DC

## 2020-05-05 NOTE — Telephone Encounter (Signed)
rx sent in for generic testosterone solution (gel).  Should be covered by insurance.

## 2020-05-05 NOTE — Telephone Encounter (Signed)
Needs ER follow up. Confirm he is doing ok.  Any acute issues.

## 2020-05-05 NOTE — Telephone Encounter (Signed)
Pt's wife called and said that the ER doctor told them they need to schedule another CT scan in 2 weeks to make sure he is ok. She was calling to get this scheduled. Does patient need an ED follow up before this is ordered?

## 2020-05-05 NOTE — Telephone Encounter (Signed)
Confirmed doing ok. No acute issues. Patient has been scheduled for ED f/u tomorrow. He is leaving for the beach Wednesday

## 2020-05-06 ENCOUNTER — Ambulatory Visit (INDEPENDENT_AMBULATORY_CARE_PROVIDER_SITE_OTHER): Payer: 59 | Admitting: Internal Medicine

## 2020-05-06 ENCOUNTER — Other Ambulatory Visit: Payer: Self-pay

## 2020-05-06 ENCOUNTER — Other Ambulatory Visit: Payer: Self-pay | Admitting: Internal Medicine

## 2020-05-06 ENCOUNTER — Encounter: Payer: Self-pay | Admitting: Internal Medicine

## 2020-05-06 VITALS — BP 110/80 | HR 81 | Temp 98.2°F | Ht 70.98 in | Wt 236.6 lb

## 2020-05-06 DIAGNOSIS — I10 Essential (primary) hypertension: Secondary | ICD-10-CM

## 2020-05-06 DIAGNOSIS — K859 Acute pancreatitis without necrosis or infection, unspecified: Secondary | ICD-10-CM | POA: Diagnosis not present

## 2020-05-06 DIAGNOSIS — R7989 Other specified abnormal findings of blood chemistry: Secondary | ICD-10-CM

## 2020-05-06 DIAGNOSIS — D72829 Elevated white blood cell count, unspecified: Secondary | ICD-10-CM

## 2020-05-06 DIAGNOSIS — E7800 Pure hypercholesterolemia, unspecified: Secondary | ICD-10-CM

## 2020-05-06 DIAGNOSIS — E1165 Type 2 diabetes mellitus with hyperglycemia: Secondary | ICD-10-CM | POA: Diagnosis not present

## 2020-05-06 DIAGNOSIS — K760 Fatty (change of) liver, not elsewhere classified: Secondary | ICD-10-CM | POA: Diagnosis not present

## 2020-05-06 DIAGNOSIS — R945 Abnormal results of liver function studies: Secondary | ICD-10-CM | POA: Diagnosis not present

## 2020-05-06 LAB — CBC WITH DIFFERENTIAL/PLATELET
Basophils Absolute: 0.1 10*3/uL (ref 0.0–0.1)
Basophils Relative: 0.7 % (ref 0.0–3.0)
Eosinophils Absolute: 0.4 10*3/uL (ref 0.0–0.7)
Eosinophils Relative: 4.9 % (ref 0.0–5.0)
HCT: 50.7 % (ref 39.0–52.0)
Hemoglobin: 17.2 g/dL — ABNORMAL HIGH (ref 13.0–17.0)
Lymphocytes Relative: 33 % (ref 12.0–46.0)
Lymphs Abs: 2.7 10*3/uL (ref 0.7–4.0)
MCHC: 33.9 g/dL (ref 30.0–36.0)
MCV: 88.8 fl (ref 78.0–100.0)
Monocytes Absolute: 0.6 10*3/uL (ref 0.1–1.0)
Monocytes Relative: 7.5 % (ref 3.0–12.0)
Neutro Abs: 4.4 10*3/uL (ref 1.4–7.7)
Neutrophils Relative %: 53.9 % (ref 43.0–77.0)
Platelets: 345 10*3/uL (ref 150.0–400.0)
RBC: 5.71 Mil/uL (ref 4.22–5.81)
RDW: 13 % (ref 11.5–15.5)
WBC: 8.2 10*3/uL (ref 4.0–10.5)

## 2020-05-06 LAB — BASIC METABOLIC PANEL
BUN: 22 mg/dL (ref 6–23)
CO2: 27 mEq/L (ref 19–32)
Calcium: 10.1 mg/dL (ref 8.4–10.5)
Chloride: 99 mEq/L (ref 96–112)
Creatinine, Ser: 1.04 mg/dL (ref 0.40–1.50)
GFR: 76.93 mL/min (ref >60.00)
Glucose, Bld: 95 mg/dL (ref 70–99)
Potassium: 4.2 mEq/L (ref 3.5–5.1)
Sodium: 135 mEq/L (ref 135–145)

## 2020-05-06 NOTE — Progress Notes (Signed)
Patient ID: Francisco Jackson, male   DOB: September 28, 1976, 44 y.o.   MRN: [id number]   Subjective:    Patient ID: Francisco Jackson, male    DOB: May 29, 1976, 44 y.o.   MRN: [id number]  HPI This visit occurred during the SARS-CoV-2 public health emergency.  Safety protocols were in place, including screening questions prior to the visit, additional usage of staff PPE, and extensive cleaning of exam room while observing appropriate contact time as indicated for disinfecting solutions.  Patient here for ER follow up.  Has a history of diabetes and hypertension.  Recently evaluated for flank pain.  Seen in ER 05/02/20.  Diagnosed with pancreatitis - CT scan reviewed.   Lipase minimally elevated - 61.  LFTs normal. Ultrasound performed in ER - no evidence of cholelithiasis or cholecystitis.  ER f/u today.  He is accompanied by his wife.  History obtained from both of them.  The pain he was previously experiencing has resolved.  He is eating.  Discussed advancing diet slowly.  Minimal constipation.  No chest pain or sob.  Discussed alcohol intake.  States drinks couple of drinks a couple of times per week.  Reports no binge drinking now.  Feels back to his baseline.  Sugars have been doing better.   Past Medical History:  Diagnosis Date  . Asthma   . Diabetes mellitus without complication ([place])   . Hypertension   . Personality disorder ([place])    borderline   Past Surgical History:  Procedure Laterality Date  . ANTERIOR CRUCIATE LIGAMENT REPAIR Right 2006  . [place] SURGERY  2010  . COLONOSCOPY WITH PROPOFOL N/A 11/01/2019   Procedure: COLONOSCOPY WITH PROPOFOL;  Surgeon: Jonathon Bellows, MD;  Location: [facility] ENDOSCOPY;  Service: Gastroenterology;  Laterality: N/A;  . MENISCUS REPAIR Left 1996   Family History  Problem Relation Age of Onset  . Diabetes Mother   . Melanoma Mother   . Diabetes Paternal Aunt   . Heart attack Paternal Aunt   . Diabetes Paternal Uncle   . Diabetes Maternal Grandmother     . Diabetes Maternal Grandfather   . Heart disease Maternal Grandfather   . Heart attack Maternal Grandfather   . Heart attack Father   . Hypertension Father   . Diabetes Sister   . Hypertension Sister   . Kidney cancer Neg Hx   . Kidney disease Neg Hx   . Prostate cancer Neg Hx    Social History   Socioeconomic History  . Marital status: Married    Spouse name: Not on file  . Number of children: Not on file  . Years of education: Not on file  . Highest education level: Not on file  Occupational History  . Not on file  Tobacco Use  . Smoking status: Former Smoker    Packs/day: 0.50    Years: 10.00    Pack years: 5.00    Types: Cigarettes    Quit date: 10/11/2002    Years since quitting: 17.6  . Smokeless tobacco: Former Systems developer    Types: Snuff    Quit date: 10/11/2009  Substance and Sexual Activity  . Alcohol use: Yes    Alcohol/week: 4.0 standard drinks    Types: 4 Glasses of wine per week    Comment: ocassional  . Drug use: No  . Sexual activity: Not on file  Other Topics Concern  . Not on file  Social History Narrative  . Not on file   Social Determinants of Health  Financial Resource Strain:   . Difficulty of Paying Living Expenses:   Food Insecurity:   . Worried About Charity fundraiser in the Last Year:   . Arboriculturist in the Last Year:   Transportation Needs:   . Film/video editor (Medical):   Marland Kitchen Lack of Transportation (Non-Medical):   Physical Activity:   . Days of Exercise per Week:   . Minutes of Exercise per Session:   Stress:   . Feeling of Stress :   Social Connections:   . Frequency of Communication with Friends and Family:   . Frequency of Social Gatherings with Friends and Family:   . Attends Religious Services:   . Active Member of Clubs or Organizations:   . Attends Archivist Meetings:   Marland Kitchen Marital Status:     Outpatient Encounter Medications as of 05/06/2020  Medication Sig  . albuterol (PROVENTIL HFA;VENTOLIN HFA)  108 (90 BASE) MCG/ACT inhaler Inhale 1-2 puffs into the lungs every 6 (six) hours as needed.  Marland Kitchen amLODipine (NORVASC) 10 MG tablet TAKE 1 TABLET BY MOUTH DAILY  . azelastine (ASTELIN) 0.1 % nasal spray   . azelastine (OPTIVAR) 0.05 % ophthalmic solution Apply 1 drop to eye 2 (two) times daily.  . cholecalciferol (VITAMIN D3) 25 MCG (1000 UNIT) tablet Take 1,000 Units by mouth daily.  Marland Kitchen FLOVENT HFA 110 MCG/ACT inhaler INHALE 2 PUFFS INTO THE LUNGS TWICE DAILY  . fluticasone (FLONASE) 50 MCG/ACT nasal spray USE 2 SPRAYS IN EACH NOSTRIL DAILY  . Insulin Pen Needle (PEN NEEDLES) 30G X 5 MM MISC Inject 1 Applicatorful into the skin once a week. Use with trulicity pen  . JARDIANCE 10 MG TABS tablet TAKE 1 TABLET BY MOUTH DAILY BEFORE BREAKFAST  . levocetirizine (XYZAL) 5 MG tablet   . lisinopril (ZESTRIL) 5 MG tablet TAKE 1 TABLET BY MOUTH DAILY  . metFORMIN (GLUCOPHAGE-XR) 500 MG 24 hr tablet Take 2 tablets (1,000 mg total) by mouth 2 (two) times daily.  . montelukast (SINGULAIR) 10 MG tablet TAKE ONE TABLET EVERY MORNING  . Multiple Vitamin (MULTIVITAMIN) tablet Take 1 tablet by mouth daily.  . rosuvastatin (CRESTOR) 5 MG tablet Take one tablet q Monday, Wednesday and Friday.  . sildenafil (REVATIO) 20 MG tablet Take 2-4 tablets two hours before intercouse on an empty stomach.  Do not take with nitrates.  . Testosterone 30 MG/ACT SOLN Place 2 Pump onto the skin daily. Apply 2 pumps to the axilla every morning  . [EXPIRED] traMADol (ULTRAM) 50 MG tablet Take 1 tablet (50 mg total) by mouth 2 (two) times daily as needed for up to 5 days.  . TRULICITY 1.5 SN/0.5LZ SOPN Inject 1.5 mg into the skin once a week.   . [DISCONTINUED] hydrochlorothiazide (MICROZIDE) 12.5 MG capsule TAKE 1 CAPSULE EVERY DAY  . [DISCONTINUED] sertraline (ZOLOFT) 100 MG tablet TAKE ONE TABLET EVERY DAY   No facility-administered encounter medications on file as of 05/06/2020.    Review of Systems  Constitutional: Negative for  appetite change and unexpected weight change.  HENT: Negative for congestion and sinus pressure.   Respiratory: Negative for cough, chest tightness and shortness of breath.   Cardiovascular: Negative for chest pain, palpitations and leg swelling.  Gastrointestinal: Negative for abdominal pain, diarrhea, nausea and vomiting.  Genitourinary: Negative for difficulty urinating and dysuria.  Musculoskeletal: Negative for joint swelling and myalgias.  Skin: Negative for color change and rash.  Neurological: Negative for dizziness, light-headedness and headaches.  Psychiatric/Behavioral:  Negative for agitation and dysphoric mood.       Objective:    Physical Exam Vitals reviewed.  Constitutional:      General: He is not in acute distress.    Appearance: Normal appearance. He is well-developed.  HENT:     Head: Normocephalic and atraumatic.  Eyes:     General: No scleral icterus.       Right eye: No discharge.        Left eye: No discharge.     Conjunctiva/sclera: Conjunctivae normal.  Cardiovascular:     Rate and Rhythm: Normal rate and regular rhythm.  Pulmonary:     Effort: Pulmonary effort is normal. No respiratory distress.     Breath sounds: Normal breath sounds.  Abdominal:     General: Bowel sounds are normal.     Palpations: Abdomen is soft.     Tenderness: There is no abdominal tenderness.  Musculoskeletal:        General: No swelling or tenderness.     Cervical back: Neck supple. No tenderness.  Lymphadenopathy:     Cervical: No cervical adenopathy.  Skin:    Findings: No erythema or rash.  Neurological:     Mental Status: He is alert.  Psychiatric:        Mood and Affect: Mood normal.        Behavior: Behavior normal.     BP 110/80 (BP Location: Left Arm, Patient Position: Sitting)   Pulse 81   Temp 98.2 F (36.8 C)   Ht 5' 10.98" (1.803 m)   Wt (!) 236 lb 9.6 oz (107.3 kg)   SpO2 98%   BMI 33.01 kg/m  Wt Readings from Last 3 Encounters:  05/06/20 (!)  236 lb 9.6 oz (107.3 kg)  05/02/20 (!) 240 lb (108.9 kg)  05/01/20 (!) 241 lb 12.8 oz (109.7 kg)     Lab Results  Component Value Date   WBC 8.2 05/06/2020   HGB 17.2 (H) 05/06/2020   HCT 50.7 05/06/2020   PLT 345.0 05/06/2020   GLUCOSE 95 05/06/2020   CHOL 151 04/03/2020   TRIG 134.0 04/03/2020   HDL 35.60 (L) 04/03/2020   LDLCALC 89 04/03/2020   ALT 31 05/02/2020   AST 19 05/02/2020   NA 135 05/06/2020   K 4.2 05/06/2020   CL 99 05/06/2020   CREATININE 1.04 05/06/2020   BUN 22 05/06/2020   CO2 27 05/06/2020   TSH 1.64 04/03/2020   PSA 0.29 04/03/2020   HGBA1C 6.5 04/03/2020   MICROALBUR <0.7 12/26/2019    CT Abdomen Pelvis Wo Contrast  Result Date: 05/02/2020 CLINICAL DATA:  Severe abdominal pain right flank pain EXAM: CT ABDOMEN AND PELVIS WITHOUT CONTRAST TECHNIQUE: Multidetector CT imaging of the abdomen and pelvis was performed following the standard protocol without IV contrast. COMPARISON:  05/01/2020 radiograph FINDINGS: Lower chest: Lung bases demonstrate no acute consolidation or pleural effusion. Normal cardiac size. Hepatobiliary: Hepatic steatosis with probable fat sparing near the gallbladder fossa. No calcified gallstone or biliary dilatation. Pancreas: Indistinct appearing pancreatic head and uncinate process with soft tissue stranding. No ductal dilatation. Spleen: Normal in size without focal abnormality. Adrenals/Urinary Tract: Adrenal glands are unremarkable. Kidneys are normal, without renal calculi, focal lesion, or hydronephrosis. Bladder is unremarkable. Stomach/Bowel: Stomach is within normal limits. Appendix appears normal. No evidence of bowel wall thickening, distention, or inflammatory changes. Vascular/Lymphatic: No significant vascular findings are present. No enlarged abdominal or pelvic lymph nodes. Reproductive: Prostate is unremarkable. Other: Negative for free  air or free fluid. Small right greater than left fat containing inguinal hernias.  Musculoskeletal: No acute or significant osseous findings. IMPRESSION: 1. Negative for hydronephrosis or ureteral stone. 2. Indistinct appearing pancreatic head and uncinate process with surrounding soft tissue stranding, consistent with acute pancreatitis. No gross focal fluid collections allowing for absence of intravenous contrast. 3. Hepatic steatosis. These results will be called to the ordering clinician or representative by the Radiologist Assistant, and communication documented in the PACS or [employer]. Electronically Signed   By: Donavan Foil M.D.   On: 05/02/2020 15:36   US ABDOMEN LIMITED RUQ  Result Date: 05/03/2020 CLINICAL DATA:  Pancreatitis, right flank pain EXAM: ULTRASOUND ABDOMEN LIMITED RIGHT UPPER QUADRANT COMPARISON:  05/02/2020 FINDINGS: Gallbladder: No gallstones or wall thickening visualized. No sonographic Murphy sign noted by sonographer. Common bile duct: Diameter: 3 mm Liver: Diffuse increased liver echotexture consistent with hepatic steatosis. No focal abnormality. Portal vein is patent on color Doppler imaging with normal direction of blood flow towards the liver. Other: None. IMPRESSION: 1. Diffuse hepatic steatosis. 2. No evidence of cholelithiasis or cholecystitis. Electronically Signed   By: Randa Ngo M.D.   On: 05/03/2020 00:56       Assessment & Plan:   Problem List Items Addressed This Visit    Pancreatitis, acute    Was recently evaluated and diagnosed with pancreatitis.  CT reviewed.  Evaluated in ER as outlined.  Ultrasound revealed no gall stones or acute cholecystitis.  Discussed alcohol intake and the need for abstinence at this time.  Refer to GI for further evaluation.  Discussed f/u CT.  Currently no pain.  Eating.  Follow.       Relevant Orders   Ambulatory referral to Gastroenterology   Hypercholesterolemia    On crestor.  Follow lipid panel and liver function tests.        Hepatic steatosis    Found on recent ultrasound.  Continue  diet, exercise and weight loss.  Keep sugars under good control.  Follow liver function tests.        Essential hypertension    Continue hctz and amlodipine.  Pressures as outlined.  Follow pressures.  Follow metabolic panel.       Diabetes mellitus ([place])    Low carb diet and exercise.  On metformin, jardiance and trulicity. Sugars have improved.  Follow met b and a1c.       Relevant Orders   Basic metabolic panel (Completed)   Abnormal liver function tests    Recent liver function tests wnl. Ultrasound as outlined.  He has adjusted his diet and low weight. Folllow.        Other Visit Diagnoses    Leukocytosis, unspecified type    -  Primary   Relevant Orders   CBC with Differential/Platelet (Completed)       Einar Pheasant, MD

## 2020-05-07 ENCOUNTER — Other Ambulatory Visit: Payer: Self-pay | Admitting: Internal Medicine

## 2020-05-07 DIAGNOSIS — D582 Other hemoglobinopathies: Secondary | ICD-10-CM

## 2020-05-07 NOTE — Progress Notes (Signed)
Order placed for f/u cbc.   

## 2020-05-22 ENCOUNTER — Other Ambulatory Visit: Payer: 59

## 2020-05-25 ENCOUNTER — Encounter: Payer: Self-pay | Admitting: Internal Medicine

## 2020-05-25 NOTE — Assessment & Plan Note (Signed)
Found on recent ultrasound.  Continue diet, exercise and weight loss.  Keep sugars under good control.  Follow liver function tests.

## 2020-05-25 NOTE — Assessment & Plan Note (Signed)
On crestor.  Follow lipid panel and liver function tests.   

## 2020-05-25 NOTE — Assessment & Plan Note (Signed)
Was recently evaluated and diagnosed with pancreatitis.  CT reviewed.  Evaluated in ER as outlined.  Ultrasound revealed no gall stones or acute cholecystitis.  Discussed alcohol intake and the need for abstinence at this time.  Refer to GI for further evaluation.  Discussed f/u CT.  Currently no pain.  Eating.  Follow.

## 2020-05-25 NOTE — Assessment & Plan Note (Signed)
Low carb diet and exercise.  On metformin, jardiance and trulicity. Sugars have improved.  Follow met b and a1c.

## 2020-05-25 NOTE — Assessment & Plan Note (Signed)
Recent liver function tests wnl. Ultrasound as outlined.  He has adjusted his diet and low weight. Folllow.

## 2020-05-25 NOTE — Assessment & Plan Note (Signed)
Continue hctz and amlodipine.  Pressures as outlined.  Follow pressures.  Follow metabolic panel.

## 2020-05-28 ENCOUNTER — Other Ambulatory Visit (INDEPENDENT_AMBULATORY_CARE_PROVIDER_SITE_OTHER): Payer: 59

## 2020-05-28 ENCOUNTER — Other Ambulatory Visit: Payer: Self-pay

## 2020-05-28 DIAGNOSIS — E1165 Type 2 diabetes mellitus with hyperglycemia: Secondary | ICD-10-CM | POA: Diagnosis not present

## 2020-05-28 DIAGNOSIS — D582 Other hemoglobinopathies: Secondary | ICD-10-CM | POA: Diagnosis not present

## 2020-05-28 DIAGNOSIS — E349 Endocrine disorder, unspecified: Secondary | ICD-10-CM | POA: Diagnosis not present

## 2020-05-28 DIAGNOSIS — E7800 Pure hypercholesterolemia, unspecified: Secondary | ICD-10-CM | POA: Diagnosis not present

## 2020-05-28 DIAGNOSIS — I10 Essential (primary) hypertension: Secondary | ICD-10-CM | POA: Diagnosis not present

## 2020-05-28 LAB — HEPATIC FUNCTION PANEL
ALT: 44 U/L (ref 0–53)
AST: 23 U/L (ref 0–37)
Albumin: 4.7 g/dL (ref 3.5–5.2)
Alkaline Phosphatase: 66 U/L (ref 39–117)
Bilirubin, Direct: 0.1 mg/dL (ref 0.0–0.3)
Total Bilirubin: 0.6 mg/dL (ref 0.2–1.2)
Total Protein: 7.7 g/dL (ref 6.0–8.3)

## 2020-05-28 LAB — CBC WITH DIFFERENTIAL/PLATELET
Basophils Absolute: 0.1 10*3/uL (ref 0.0–0.1)
Basophils Relative: 0.8 % (ref 0.0–3.0)
Eosinophils Absolute: 0.5 10*3/uL (ref 0.0–0.7)
Eosinophils Relative: 6.1 % — ABNORMAL HIGH (ref 0.0–5.0)
HCT: 52.7 % — ABNORMAL HIGH (ref 39.0–52.0)
Hemoglobin: 17.6 g/dL — ABNORMAL HIGH (ref 13.0–17.0)
Lymphocytes Relative: 30.1 % (ref 12.0–46.0)
Lymphs Abs: 2.6 10*3/uL (ref 0.7–4.0)
MCHC: 33.4 g/dL (ref 30.0–36.0)
MCV: 88.8 fl (ref 78.0–100.0)
Monocytes Absolute: 0.7 10*3/uL (ref 0.1–1.0)
Monocytes Relative: 8.1 % (ref 3.0–12.0)
Neutro Abs: 4.8 10*3/uL (ref 1.4–7.7)
Neutrophils Relative %: 54.9 % (ref 43.0–77.0)
Platelets: 266 10*3/uL (ref 150.0–400.0)
RBC: 5.94 Mil/uL — ABNORMAL HIGH (ref 4.22–5.81)
RDW: 13.4 % (ref 11.5–15.5)
WBC: 8.7 10*3/uL (ref 4.0–10.5)

## 2020-05-28 LAB — BASIC METABOLIC PANEL
BUN: 13 mg/dL (ref 6–23)
CO2: 27 mEq/L (ref 19–32)
Calcium: 9.9 mg/dL (ref 8.4–10.5)
Chloride: 101 mEq/L (ref 96–112)
Creatinine, Ser: 1.05 mg/dL (ref 0.40–1.50)
GFR: 76.07 mL/min (ref >60.00)
Glucose, Bld: 97 mg/dL (ref 70–99)
Potassium: 4.2 mEq/L (ref 3.5–5.1)
Sodium: 138 mEq/L (ref 135–145)

## 2020-05-28 LAB — LIPID PANEL
Cholesterol: 177 mg/dL (ref 0–200)
HDL: 37.3 mg/dL — ABNORMAL LOW (ref >39.00)
LDL Cholesterol: 108 mg/dL — ABNORMAL HIGH (ref 0–99)
NonHDL: 139.64
Total CHOL/HDL Ratio: 5
Triglycerides: 158 mg/dL — ABNORMAL HIGH (ref 0.0–149.0)
VLDL: 31.6 mg/dL (ref 0.0–40.0)

## 2020-05-28 LAB — PSA: PSA: 0.34 ng/mL (ref 0.10–4.00)

## 2020-05-28 LAB — TESTOSTERONE: Testosterone: 338.16 ng/dL (ref 300.00–890.00)

## 2020-05-28 LAB — HEMOGLOBIN A1C: Hgb A1c MFr Bld: 6.2 % (ref 4.6–6.5)

## 2020-06-02 ENCOUNTER — Other Ambulatory Visit: Payer: Self-pay

## 2020-06-02 ENCOUNTER — Ambulatory Visit (INDEPENDENT_AMBULATORY_CARE_PROVIDER_SITE_OTHER): Payer: 59 | Admitting: Dermatology

## 2020-06-02 DIAGNOSIS — L814 Other melanin hyperpigmentation: Secondary | ICD-10-CM | POA: Diagnosis not present

## 2020-06-02 DIAGNOSIS — D1800 Hemangioma unspecified site: Secondary | ICD-10-CM

## 2020-06-02 DIAGNOSIS — L821 Other seborrheic keratosis: Secondary | ICD-10-CM

## 2020-06-02 DIAGNOSIS — D229 Melanocytic nevi, unspecified: Secondary | ICD-10-CM

## 2020-06-02 DIAGNOSIS — Z86018 Personal history of other benign neoplasm: Secondary | ICD-10-CM

## 2020-06-02 DIAGNOSIS — S63262A Dislocation of metacarpophalangeal joint of right middle finger, initial encounter: Secondary | ICD-10-CM | POA: Diagnosis not present

## 2020-06-02 DIAGNOSIS — Z85820 Personal history of malignant melanoma of skin: Secondary | ICD-10-CM | POA: Diagnosis not present

## 2020-06-02 DIAGNOSIS — L578 Other skin changes due to chronic exposure to nonionizing radiation: Secondary | ICD-10-CM

## 2020-06-02 DIAGNOSIS — Z1283 Encounter for screening for malignant neoplasm of skin: Secondary | ICD-10-CM | POA: Diagnosis not present

## 2020-06-02 NOTE — Patient Instructions (Signed)

## 2020-06-02 NOTE — Progress Notes (Signed)
   Follow-Up Visit   Subjective  Francisco Jackson is a 44 y.o. male who presents for the following: Annual Exam. The patient presents for Total-Body Skin Exam (TBSE) for skin cancer screening and mole check. Patient here today for TBSE. He does have a history of dysplastic nevi. Patient not aware of anything new or changing today. There is a fhx melanoma in patient's mother.   The following portions of the chart were reviewed this encounter and updated as appropriate:  Tobacco  Allergies  Meds  Problems  Med Hx  Surg Hx  Fam Hx     Review of Systems:  No other skin or systemic complaints except as noted in HPI or Assessment and Plan.  Objective  Well appearing patient in no apparent distress; mood and affect are within normal limits.  A full examination was performed including scalp, head, eyes, ears, nose, lips, neck, chest, axillae, abdomen, back, buttocks, bilateral upper extremities, bilateral lower extremities, hands, feet, fingers, toes, fingernails, and toenails. All findings within normal limits unless otherwise noted below.    Assessment & Plan    Family History of Melanoma - Recommend regular full body skin exams - Recommend daily broad spectrum sunscreen SPF 30+ to sun-exposed areas, reapply every 2 hours as needed.  - Call if any new or changing lesions are noted between office visits  Lentigines - Scattered tan macules - Discussed due to sun exposure - Benign, observe - Call for any changes  Seborrheic Keratoses - Stuck-on, waxy, tan-brown papules and plaques  - Discussed benign etiology and prognosis. - Observe - Call for any changes  Melanocytic Nevi - Tan-brown and/or pink-flesh-colored symmetric macules and papules - Benign appearing on exam today - Observation - Call clinic for new or changing moles - Recommend daily use of broad spectrum spf 30+ sunscreen to sun-exposed areas.   Hemangiomas - Red papules - Discussed benign nature -  Observe - Call for any changes  Actinic Damage - diffuse scaly erythematous macules with underlying dyspigmentation - Recommend daily broad spectrum sunscreen SPF 30+ to sun-exposed areas, reapply every 2 hours as needed.  - Call for new or changing lesions.  Skin cancer screening performed today.  History of Dysplastic Nevi - No evidence of recurrence today - Recommend regular full body skin exams - Recommend daily broad spectrum sunscreen SPF 30+ to sun-exposed areas, reapply every 2 hours as needed.  - Call if any new or changing lesions are noted between office visits  Return in about 1 year (around 06/02/2021) for TBSE.  Graciella Belton, RMA, am acting as scribe for Sarina Ser, MD . Documentation: I have reviewed the above documentation for accuracy and completeness, and I agree with the above.  Sarina Ser, MD

## 2020-06-05 ENCOUNTER — Other Ambulatory Visit: Payer: Self-pay | Admitting: Internal Medicine

## 2020-06-05 DIAGNOSIS — S63262D Dislocation of metacarpophalangeal joint of right middle finger, subsequent encounter: Secondary | ICD-10-CM | POA: Diagnosis not present

## 2020-06-09 ENCOUNTER — Encounter: Payer: Self-pay | Admitting: Dermatology

## 2020-06-13 ENCOUNTER — Telehealth: Payer: Self-pay | Admitting: *Deleted

## 2020-06-13 DIAGNOSIS — D582 Other hemoglobinopathies: Secondary | ICD-10-CM

## 2020-06-13 NOTE — Telephone Encounter (Signed)
Please place future orders for lab appt.  

## 2020-06-14 NOTE — Telephone Encounter (Signed)
Order placed for f/u cbc.   

## 2020-06-18 ENCOUNTER — Other Ambulatory Visit: Payer: Self-pay

## 2020-06-18 ENCOUNTER — Other Ambulatory Visit (INDEPENDENT_AMBULATORY_CARE_PROVIDER_SITE_OTHER): Payer: 59

## 2020-06-18 DIAGNOSIS — D582 Other hemoglobinopathies: Secondary | ICD-10-CM

## 2020-06-19 LAB — CBC WITH DIFFERENTIAL/PLATELET
Basophils Absolute: 0.1 10*3/uL (ref 0.0–0.1)
Basophils Relative: 0.7 % (ref 0.0–3.0)
Eosinophils Absolute: 0.3 10*3/uL (ref 0.0–0.7)
Eosinophils Relative: 3.4 % (ref 0.0–5.0)
HCT: 49.1 % (ref 39.0–52.0)
Hemoglobin: 16.8 g/dL (ref 13.0–17.0)
Lymphocytes Relative: 29.6 % (ref 12.0–46.0)
Lymphs Abs: 2.6 10*3/uL (ref 0.7–4.0)
MCHC: 34.3 g/dL (ref 30.0–36.0)
MCV: 88 fl (ref 78.0–100.0)
Monocytes Absolute: 0.7 10*3/uL (ref 0.1–1.0)
Monocytes Relative: 7.5 % (ref 3.0–12.0)
Neutro Abs: 5.2 10*3/uL (ref 1.4–7.7)
Neutrophils Relative %: 58.8 % (ref 43.0–77.0)
Platelets: 269 10*3/uL (ref 150.0–400.0)
RBC: 5.58 Mil/uL (ref 4.22–5.81)
RDW: 13.4 % (ref 11.5–15.5)
WBC: 8.9 10*3/uL (ref 4.0–10.5)

## 2020-06-23 ENCOUNTER — Telehealth: Payer: 59

## 2020-06-24 ENCOUNTER — Telehealth: Payer: 59

## 2020-06-24 DIAGNOSIS — Z8719 Personal history of other diseases of the digestive system: Secondary | ICD-10-CM | POA: Diagnosis not present

## 2020-06-24 DIAGNOSIS — S63252D Unspecified dislocation of right middle finger, subsequent encounter: Secondary | ICD-10-CM | POA: Diagnosis not present

## 2020-06-24 DIAGNOSIS — K760 Fatty (change of) liver, not elsewhere classified: Secondary | ICD-10-CM | POA: Diagnosis not present

## 2020-06-25 ENCOUNTER — Ambulatory Visit: Payer: 59 | Admitting: Pharmacist

## 2020-06-25 DIAGNOSIS — E1165 Type 2 diabetes mellitus with hyperglycemia: Secondary | ICD-10-CM

## 2020-06-25 DIAGNOSIS — E7800 Pure hypercholesterolemia, unspecified: Secondary | ICD-10-CM

## 2020-06-25 MED ORDER — ROSUVASTATIN CALCIUM 5 MG PO TABS: 5.0000 mg | ORAL_TABLET | Freq: Every day | ORAL | 3 refills | Status: DC

## 2020-06-25 NOTE — Chronic Care Management (AMB) (Signed)
Chronic Care Management   Follow Up Note   06/25/2020 Name: Francisco Jackson MRN: [id number] DOB: 11-Apr-1976  Referred by: Francisco Pheasant, MD Reason for referral : Chronic Care Management (Medication Management)   Francisco Jackson is a 44 y.o. year old male who is a primary care patient of Francisco Pheasant, MD. The CCM team was consulted for assistance with chronic disease management and care coordination needs.    Contacted patient for medication management review.   Review of patient status, including review of consultants reports, relevant laboratory and other test results, and collaboration with appropriate care team members and the patient's provider was performed as part of comprehensive patient evaluation and provision of chronic care management services.    SDOH (Social Determinants of Health) assessments performed: No See Care Plan activities for detailed interventions related to Francisco Jackson)     Outpatient Encounter Medications as of 06/25/2020  Medication Sig  . amLODipine (NORVASC) 10 MG tablet TAKE 1 TABLET BY MOUTH DAILY  . azelastine (ASTELIN) 0.1 % nasal spray   . azelastine (OPTIVAR) 0.05 % ophthalmic solution Apply 1 drop to eye 2 (two) times daily.  . cholecalciferol (VITAMIN D3) 25 MCG (1000 UNIT) tablet Take 1,000 Units by mouth daily.  Marland Kitchen FLOVENT HFA 110 MCG/ACT inhaler INHALE 2 PUFFS INTO THE LUNGS TWICE DAILY  . fluticasone (FLONASE) 50 MCG/ACT nasal spray USE 2 SPRAYS IN EACH NOSTRIL DAILY  . hydrochlorothiazide (MICROZIDE) 12.5 MG capsule TAKE 1 CAPSULE EVERY DAY  . Insulin Pen Needle (PEN NEEDLES) 30G X 5 MM MISC Inject 1 Applicatorful into the skin once a week. Use with trulicity pen  . JARDIANCE 10 MG TABS tablet TAKE 1 TABLET BY MOUTH DAILY BEFORE BREAKFAST  . levocetirizine (XYZAL) 5 MG tablet Take 5 mg by mouth daily as needed.   Marland Kitchen lisinopril (ZESTRIL) 5 MG tablet TAKE 1 TABLET BY MOUTH DAILY  . metFORMIN (GLUCOPHAGE-XR) 500 MG 24 hr tablet Take 2 tablets  (1,000 mg total) by mouth 2 (two) times daily.  . montelukast (SINGULAIR) 10 MG tablet TAKE ONE TABLET EVERY MORNING  . Multiple Vitamin (MULTIVITAMIN) tablet Take 1 tablet by mouth daily.  . rosuvastatin (CRESTOR) 5 MG tablet Take 1 tablet (5 mg total) by mouth daily.  . sertraline (ZOLOFT) 100 MG tablet TAKE 1 TABLET BY MOUTH DAILY  . sildenafil (REVATIO) 20 MG tablet Take 2-4 tablets two hours before intercouse on an empty stomach.  Do not take with nitrates.  . Testosterone 30 MG/ACT SOLN Place 2 Pump onto the skin daily. Apply 2 pumps to the axilla every morning  . TRULICITY 1.5 VO/5.3GU SOPN Inject 1.5 mg into the skin once a week.   . [DISCONTINUED] rosuvastatin (CRESTOR) 5 MG tablet Take one tablet q Monday, Wednesday and Friday.  Marland Kitchen albuterol (PROVENTIL HFA;VENTOLIN HFA) 108 (90 BASE) MCG/ACT inhaler Inhale 1-2 puffs into the lungs every 6 (six) hours as needed. (Patient not taking: Reported on 06/25/2020)   No facility-administered encounter medications on file as of 06/25/2020.     Objective:   Goals Addressed              This Visit's Progress     Patient Stated   .  PharmD "I want to work on my sugars" (pt-stated)        Francisco Jackson (see longtitudinal plan of care for additional care plan information)  Current Barriers:  . Social, community, and financial needs o Denies any cost concerns. Recounts recent episode of pancreatitis, notes  that at appt w/ GI yesterday, no specific cause was definitively found. Labs checked.  o Reports upcoming surgery for a tendon repair on finger on 07/11/20.  o Asks about recent testosterone level. . Diabetes: controlled; complicated by chronic medical conditions including HTN, HLD, recent pancreatitis, most recent A1c 6.2% . Most recent eGFR: ~ 76 mL/min . Current antihyperglycemic regimen: metformin XR 1000 mg BID, Jardiance 10 mg QAM, Trulicity 1.5 mg weekly  . Cardiovascular risk reduction: o Current hypertensive regimen: HCTZ 12.5  mg QAM, lisinopril 5 mg, amlodipine 10 mg; last [name] at goal o Current hyperlipidemia regimen: Rosuvastatin 5 mg daily - recently increased d/t LDL not at goal <70. Patient confirms he has increased to daily with no concerns.  o Current antiplatelet regimen: ASA 81 mg daily  . Low testosterone levels: testosterone 30 mg solution 2 pumps daily. Last testosterone level at goal. Patient endorses improved energy.   Pharmacist Clinical Goal(s):  Marland Kitchen Over the next 90 days, patient will work with PharmD and primary care provider to address optimized medication management  Interventions: . Comprehensive medication review performed, medication list updated in electronic medical record . Inter-disciplinary care team collaboration (see longitudinal plan of care) . Praised for attainment of goal A1c.  . Discussed that with no other identifiable cause, it may be prudent to d/c GLP1. Francisco Jackson GI and left message for Francisco Jackson, Francisco Jackson. Given patient's controlled A1c, we could d/c Trulicity and increase Jardiance in the future if needed. Will await call back from their office.  . Encouraged continued adherence to rosuvastatin daily. Will send updated script to the pharmacy.  . Reviewed testosterone levels and recent lab work.   Patient Self Care Activities:  . Patient will check blood glucose BID, document, and provide at future appointments . Patient will take medications as prescribed . Patient will report any questions or concerns to provider   Please see past updates related to this goal by clicking on the "Past Updates" button in the selected goal          Plan:  - Will await call back from GI provider and determine f/u at that time.   Francisco Jackson, PharmD, [place], CPP Clinical Pharmacist [place] [phone]

## 2020-06-25 NOTE — Patient Instructions (Signed)
Visit Information  Goals Addressed              This Visit's Progress     Patient Stated   .  PharmD "I want to work on my sugars" (pt-stated)        [place] (see longtitudinal plan of care for additional care plan information)  Current Barriers:  . Social, community, and financial needs o Denies any cost concerns. Recounts recent episode of pancreatitis, notes that at appt w/ GI yesterday, no specific cause was definitively found. Labs checked.  o Reports upcoming surgery for a tendon repair on finger on 07/11/20.  o Asks about recent testosterone level. . Diabetes: controlled; complicated by chronic medical conditions including HTN, HLD, recent pancreatitis, most recent A1c 6.2% . Most recent eGFR: ~ 76 mL/min . Current antihyperglycemic regimen: metformin XR 1000 mg BID, Jardiance 10 mg QAM, Trulicity 1.5 mg weekly  . Cardiovascular risk reduction: o Current hypertensive regimen: HCTZ 12.5 mg QAM, lisinopril 5 mg, amlodipine 10 mg; last clinic BP at goal o Current hyperlipidemia regimen: Rosuvastatin 5 mg daily - recently increased d/t LDL not at goal <70. Patient confirms he has increased to daily with no concerns.  o Current antiplatelet regimen: ASA 81 mg daily  . Low testosterone levels: testosterone 30 mg solution 2 pumps daily. Last testosterone level at goal. Patient endorses improved energy.   Pharmacist Clinical Goal(s):  Marland Kitchen Over the next 90 days, patient will work with PharmD and primary care provider to address optimized medication management  Interventions: . Comprehensive medication review performed, medication list updated in electronic medical record . Inter-disciplinary care team collaboration (see longitudinal plan of care) . Praised for attainment of goal A1c.  . Discussed that with no other identifiable cause, it may be prudent to d/c GLP1. [facility] and left message for [place], [place]. Given patient's controlled A1c, we could d/c  Trulicity and increase Jardiance in the future if needed. Will await call back from their office.  . Encouraged continued adherence to rosuvastatin daily. Will send updated script to the pharmacy.  . Reviewed testosterone levels and recent lab work.   Patient Self Care Activities:  . Patient will check blood glucose BID, document, and provide at future appointments . Patient will take medications as prescribed . Patient will report any questions or concerns to provider   Please see past updates related to this goal by clicking on the "Past Updates" button in the selected goal         The patient verbalized understanding of instructions provided today and declined a print copy of patient instruction materials.   Plan:  - Will await call back from GI provider and determine f/u at that time.   Catie Darnelle Maffucci, PharmD, [place], CPP Clinical Pharmacist [place] [phone]

## 2020-07-01 ENCOUNTER — Other Ambulatory Visit: Payer: Self-pay | Admitting: Student

## 2020-07-01 DIAGNOSIS — Z8719 Personal history of other diseases of the digestive system: Secondary | ICD-10-CM

## 2020-07-03 ENCOUNTER — Other Ambulatory Visit: Payer: Self-pay | Admitting: Internal Medicine

## 2020-07-05 NOTE — Telephone Encounter (Signed)
rx sent in for testosterone refill.

## 2020-07-11 DIAGNOSIS — G8918 Other acute postprocedural pain: Secondary | ICD-10-CM | POA: Diagnosis not present

## 2020-07-11 DIAGNOSIS — Z7984 Long term (current) use of oral hypoglycemic drugs: Secondary | ICD-10-CM | POA: Diagnosis not present

## 2020-07-11 DIAGNOSIS — E119 Type 2 diabetes mellitus without complications: Secondary | ICD-10-CM | POA: Diagnosis not present

## 2020-07-11 DIAGNOSIS — E7800 Pure hypercholesterolemia, unspecified: Secondary | ICD-10-CM | POA: Diagnosis not present

## 2020-07-11 DIAGNOSIS — M79641 Pain in right hand: Secondary | ICD-10-CM | POA: Diagnosis not present

## 2020-07-11 DIAGNOSIS — J45909 Unspecified asthma, uncomplicated: Secondary | ICD-10-CM | POA: Diagnosis not present

## 2020-07-11 DIAGNOSIS — Z87891 Personal history of nicotine dependence: Secondary | ICD-10-CM | POA: Diagnosis not present

## 2020-07-11 DIAGNOSIS — S63252D Unspecified dislocation of right middle finger, subsequent encounter: Secondary | ICD-10-CM | POA: Diagnosis not present

## 2020-07-11 DIAGNOSIS — S63212A Subluxation of metacarpophalangeal joint of right middle finger, initial encounter: Secondary | ICD-10-CM | POA: Diagnosis not present

## 2020-07-11 DIAGNOSIS — S66312A Strain of extensor muscle, fascia and tendon of right middle finger at wrist and hand level, initial encounter: Secondary | ICD-10-CM | POA: Diagnosis not present

## 2020-07-11 DIAGNOSIS — S66291A Other specified injury of extensor muscle, fascia and tendon of right thumb at wrist and hand level, initial encounter: Secondary | ICD-10-CM | POA: Diagnosis not present

## 2020-07-11 DIAGNOSIS — R69 Illness, unspecified: Secondary | ICD-10-CM | POA: Diagnosis not present

## 2020-07-11 DIAGNOSIS — Z794 Long term (current) use of insulin: Secondary | ICD-10-CM | POA: Diagnosis not present

## 2020-07-18 DIAGNOSIS — S63262A Dislocation of metacarpophalangeal joint of right middle finger, initial encounter: Secondary | ICD-10-CM | POA: Diagnosis not present

## 2020-07-18 DIAGNOSIS — S66801D Unspecified injury of other specified muscles, fascia and tendons at wrist and hand level, right hand, subsequent encounter: Secondary | ICD-10-CM | POA: Diagnosis not present

## 2020-07-19 ENCOUNTER — Ambulatory Visit
Admission: RE | Admit: 2020-07-19 | Discharge: 2020-07-19 | Disposition: A | Discharge status: Home / Self Care | Payer: 59 | Source: Ambulatory Visit | Attending: Student | Admitting: Student

## 2020-07-19 ENCOUNTER — Other Ambulatory Visit: Payer: Self-pay | Admitting: Student

## 2020-07-19 ENCOUNTER — Other Ambulatory Visit: Payer: Self-pay

## 2020-07-19 DIAGNOSIS — Z8719 Personal history of other diseases of the digestive system: Secondary | ICD-10-CM | POA: Diagnosis not present

## 2020-07-19 DIAGNOSIS — K760 Fatty (change of) liver, not elsewhere classified: Secondary | ICD-10-CM | POA: Diagnosis not present

## 2020-07-19 DIAGNOSIS — R935 Abnormal findings on diagnostic imaging of other abdominal regions, including retroperitoneum: Secondary | ICD-10-CM | POA: Diagnosis not present

## 2020-07-19 MED ORDER — GADOBUTROL 1 MMOL/ML IV SOLN
10.0000 mL | Freq: Once | INTRAVENOUS | Status: AC | PRN
  Administered 2020-07-19: 9 mL via INTRAVENOUS

## 2020-07-24 ENCOUNTER — Other Ambulatory Visit: Payer: Self-pay | Admitting: *Deleted

## 2020-07-24 NOTE — Patient Outreach (Signed)
[facility]) Care Management  07/24/2020  Francisco Jackson 10-Aug-1976 [id number]  [facility] Telephone Assessment/Screen for Aetna insurancereferral  Referral Date: 07/16/20 Referral Source: Gracy Bruins Referral Reason: Complex CM and the HTN initiative-non-urgent follow up in < 10 days per [place].  Additional information: DM, A1C 6.2, Elevated lipids, pancreatitis, * CCM w/Catie.    Insurance: [employer]    Outreach attempt # 1 No answer. [facility] RN CM left HIPAA [facility] Portability and Accountability Act) compliant voicemail message along with CM's contact info.   Plan: [facility] RN CM sent an unsuccessful outreach letter and scheduled this patient for another call attempt within 4 business days    Igor Bishop L. Lavina Hamman, RN, BSN, [place] Coordinator Office number [phone] Mobile number [phone]  Main [facility] number [phone] Fax number [phone]

## 2020-07-30 ENCOUNTER — Encounter: Payer: Self-pay | Admitting: *Deleted

## 2020-07-30 ENCOUNTER — Other Ambulatory Visit: Payer: Self-pay | Admitting: *Deleted

## 2020-07-30 ENCOUNTER — Other Ambulatory Visit: Payer: Self-pay

## 2020-07-30 NOTE — Patient Outreach (Signed)
[facility]) Care Management  07/30/2020  KRISTEN BUSHWAY [birth year] [id number]   [facility] Telephone Assessment/Screen for Aetna insurance referral  Referral Date: 07/16/20 Referral Source: Gracy Bruins Referral Reason: Complex CM and the HTN initiative-non-urgent follow up in < 10 days per [place].  Additional information: DM, A1C 6.2, Elevated lipids, pancreatitis, * CCM w/Catie.   Insurance: Bernadene Person Last admission 11/01/19 colonoscopy Last Ed 05/03/20 abdominal pain-pancreatitis   Outreach attempt # 2 successful  Patient is able to verify HIPAA ([place]) identifiers Reviewed and addressed the purpose of the follow up call with the patient  Consent: [facility] RN CM reviewed [facility] services with patient. Patient gave verbal consent for services.   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 [place] He reports he is doing well with his diabetes, hypertension and monitors at home He reports a recent surgery for hand ligament - Traumatic Dislocation of Metacarpophalangeal Joint; Injury of Extensor Tendon of Hand He voiced his concern for the purpose of the outreach (termination of coverage)  [facility] RN CM discussed the purpose of [facility] and partnership with [place] Mr Delfavero agrees to outreach every 3 months and informs [facility] RN CM he will outreach to [facility] RN CM prn   EMMI education sent to listed e-mail in Epic for hypertension, hyperlipidemia, diabetes type 2, pancreatitis  Plan [facility] RN CM will follow up with Mr Ambrosius as agreed within the next 90-98 business day and prn  Pt encouraged to return a call to [facility] RN CM prn Goals Addressed              This Visit's Progress     Patient Stated   .  [facility]) Make and Keep All Appointments (pt-stated)   On track     Follow Up Date 10/30/20  - call to cancel if needed -  keep a calendar with appointment dates    Why is this important?   Part of staying healthy is seeing the doctor for follow-up care.  If you forget your appointments, there are some things you can do to stay on track.    Notes:     .  [facility]) Track and Manage My Blood Pressure (pt-stated)   On track     Follow Up Date 10/30/20    - check blood pressure daily - choose a place to take my blood pressure (home, clinic or office, retail store)    Why is this important?   You won't feel high blood pressure, but it can still hurt your blood vessels.  High blood pressure can cause heart or kidney problems. It can also cause a stroke.  Making lifestyle changes like losing a little weight or eating less salt will help.  Checking your blood pressure at home and at different times of the day can help to control blood pressure.  If the doctor prescribes medicine remember to take it the way the doctor ordered.  Call the office if you cannot afford the medicine or if there are questions about it.     Notes:        Batool Majid L. Lavina Hamman, RN, BSN, [place] Coordinator Office number [phone] Main [facility] number [phone] Fax number [phone]

## 2020-08-04 ENCOUNTER — Encounter: Payer: Self-pay | Admitting: Internal Medicine

## 2020-08-04 ENCOUNTER — Telehealth (INDEPENDENT_AMBULATORY_CARE_PROVIDER_SITE_OTHER): Payer: 59 | Admitting: Internal Medicine

## 2020-08-04 ENCOUNTER — Other Ambulatory Visit: Payer: Self-pay

## 2020-08-04 DIAGNOSIS — R945 Abnormal results of liver function studies: Secondary | ICD-10-CM

## 2020-08-04 DIAGNOSIS — E782 Mixed hyperlipidemia: Secondary | ICD-10-CM | POA: Diagnosis not present

## 2020-08-04 DIAGNOSIS — E349 Endocrine disorder, unspecified: Secondary | ICD-10-CM | POA: Diagnosis not present

## 2020-08-04 DIAGNOSIS — F1010 Alcohol abuse, uncomplicated: Secondary | ICD-10-CM

## 2020-08-04 DIAGNOSIS — K760 Fatty (change of) liver, not elsewhere classified: Secondary | ICD-10-CM | POA: Diagnosis not present

## 2020-08-04 DIAGNOSIS — R41840 Attention and concentration deficit: Secondary | ICD-10-CM | POA: Diagnosis not present

## 2020-08-04 DIAGNOSIS — F320 Major depressive disorder, single episode, mild: Secondary | ICD-10-CM

## 2020-08-04 DIAGNOSIS — E1165 Type 2 diabetes mellitus with hyperglycemia: Secondary | ICD-10-CM | POA: Diagnosis not present

## 2020-08-04 DIAGNOSIS — R69 Illness, unspecified: Secondary | ICD-10-CM | POA: Diagnosis not present

## 2020-08-04 DIAGNOSIS — R7989 Other specified abnormal findings of blood chemistry: Secondary | ICD-10-CM

## 2020-08-04 DIAGNOSIS — F32A Depression, unspecified: Secondary | ICD-10-CM

## 2020-08-04 DIAGNOSIS — I10 Essential (primary) hypertension: Secondary | ICD-10-CM

## 2020-08-04 NOTE — Progress Notes (Signed)
Patient ID: Francisco Jackson, male   DOB: 11-Jan-1976, 44 y.o.   MRN: [id number]   Virtual Visit via telephone Note  This visit type was conducted due to national recommendations for restrictions regarding the COVID-19 pandemic (e.g. social distancing).  This format is felt to be most appropriate for this patient at this time.  All issues noted in this document were discussed and addressed.  No physical exam was performed (except for noted visual exam findings with Video Visits).   I connected with Cordelia Pen by telephone and verified that I am speaking with the correct person using two identifiers. Location patient: home Location provider: work Persons participating in the telephone visit: patient, provider  The limitations, risks, security and privacy concerns of performing an evaluation and management service by telephone and the availability of in person appointments have been discussed.  It has also been discussed with the patient that there may be a patient responsible charge related to this service. The patient expressed understanding and agreed to proceed.   Reason for visit: scheduled follow up.   HPI: Follow up regarding his blood pressure, blood sugar and cholesterol.  He has been recently evaluated by Dr Peggye Ley for dislocated finger/torn ligament.  S/p surgery.  Doing better.  Working.  Trying to stay active.  No chest pain or sob reported.  No abdominal pain or bowel change reported.  Has cut down his alcohol intake.  May drink 5-6 beers per week.  Saw GI.  Note reviewed.  They are following.  He has no further episodes/flares.  His am sugars averaging 120-130.  PM sugars - does not check regularly. Is interested in continuous glucose monitoring.  States blood pressure ok.  Had cold last week - sinus pressure.  Used mucinex and an inhaler.  covid negative.  States is fine now.  No chest congestion or sob.  No abdominal pain or cramping.  Bowels moving.  Taking zoloft.  Handling  stress better.  Does have trouble focusing.  Is concerned about ADHD and would like to be formally tested.     ROS: See pertinent positives and negatives per HPI.  Past Medical History:  Diagnosis Date  . Asthma   . Atypical mole 04/05/2013   Left forearm. Atypical combined nevus, moderate atypia.  . Diabetes mellitus without complication ([place])   . Dysplastic nevus 04/23/2014   Left prox. mid. tricep axilla. Mild atypia, margins free.  Marland Kitchen Hypertension   . Personality disorder ([place])    borderline    Past Surgical History:  Procedure Laterality Date  . ANTERIOR CRUCIATE LIGAMENT REPAIR Right 2006  . [place] SURGERY  2010  . COLONOSCOPY WITH PROPOFOL N/A 11/01/2019   Procedure: COLONOSCOPY WITH PROPOFOL;  Surgeon: Jonathon Bellows, MD;  Location: [facility] ENDOSCOPY;  Service: Gastroenterology;  Laterality: N/A;  . MENISCUS REPAIR Left 1996    Family History  Problem Relation Age of Onset  . Diabetes Mother   . Melanoma Mother   . Diabetes Paternal Aunt   . Heart attack Paternal Aunt   . Diabetes Paternal Uncle   . Diabetes Maternal Grandmother   . Diabetes Maternal Grandfather   . Heart disease Maternal Grandfather   . Heart attack Maternal Grandfather   . Heart attack Father   . Hypertension Father   . Diabetes Sister   . Hypertension Sister   . Kidney cancer Neg Hx   . Kidney disease Neg Hx   . Prostate cancer Neg Hx     SOCIAL HX:  reviewed.    Current Outpatient Medications:  .  albuterol (PROVENTIL HFA;VENTOLIN HFA) 108 (90 BASE) MCG/ACT inhaler, Inhale 1-2 puffs into the lungs every 6 (six) hours as needed., Disp: 18 g, Rfl: 6 .  amLODipine (NORVASC) 10 MG tablet, TAKE 1 TABLET BY MOUTH DAILY, Disp: 30 tablet, Rfl: 3 .  azelastine (ASTELIN) 0.1 % nasal spray, , Disp: , Rfl: 2 .  azelastine (OPTIVAR) 0.05 % ophthalmic solution, Apply 1 drop to eye 2 (two) times daily., Disp: , Rfl:  .  cholecalciferol (VITAMIN D3) 25 MCG (1000 UNIT) tablet, Take 1,000 Units by mouth  daily., Disp: , Rfl:  .  FLOVENT HFA 110 MCG/ACT inhaler, INHALE 2 PUFFS INTO THE LUNGS TWICE DAILY, Disp: 12 g, Rfl: 3 .  fluticasone (FLONASE) 50 MCG/ACT nasal spray, USE 2 SPRAYS IN EACH NOSTRIL DAILY, Disp: 16 g, Rfl: 3 .  hydrochlorothiazide (MICROZIDE) 12.5 MG capsule, TAKE 1 CAPSULE BY MOUTH ONCE DAILY, Disp: 30 capsule, Rfl: 1 .  Insulin Pen Needle (PEN NEEDLES) 30G X 5 MM MISC, Inject 1 Applicatorful into the skin once a week. Use with trulicity pen, Disp: 683 each, Rfl: 2 .  JARDIANCE 10 MG TABS tablet, TAKE 1 TABLET BY MOUTH DAILY BEFORE BREAKFAST, Disp: 30 tablet, Rfl: 2 .  lisinopril (ZESTRIL) 5 MG tablet, TAKE 1 TABLET BY MOUTH DAILY, Disp: 90 tablet, Rfl: 1 .  metFORMIN (GLUCOPHAGE-XR) 500 MG 24 hr tablet, Take 2 tablets (1,000 mg total) by mouth 2 (two) times daily., Disp: 360 tablet, Rfl: 2 .  montelukast (SINGULAIR) 10 MG tablet, TAKE ONE TABLET EVERY MORNING, Disp: 30 tablet, Rfl: 0 .  Multiple Vitamin (MULTIVITAMIN) tablet, Take 1 tablet by mouth daily., Disp: , Rfl:  .  rosuvastatin (CRESTOR) 5 MG tablet, Take 1 tablet (5 mg total) by mouth daily., Disp: 90 tablet, Rfl: 3 .  sildenafil (REVATIO) 20 MG tablet, Take 2-4 tablets two hours before intercouse on an empty stomach.  Do not take with nitrates., Disp: 30 tablet, Rfl: 1 .  Testosterone 30 MG/ACT SOLN, PLACE 2 PUMPS ONTO THE SKIN OF AXILLA EVERY MORNING, Disp: 90 mL, Rfl: 1 .  TRULICITY 1.5 MH/9.6QI SOPN, Inject 1.5 mg into the skin once a week. , Disp: , Rfl:  .  sertraline (ZOLOFT) 100 MG tablet, TAKE 1 TABLET BY MOUTH DAILY, Disp: 30 tablet, Rfl: 0  EXAM:  GENERAL: alert.  Sounds to be in no acute distress.  Answering questions appropriately.   PSYCH/NEURO: pleasant and cooperative, no obvious depression or anxiety, speech and thought processing grossly intact  ASSESSMENT AND PLAN:  Discussed the following assessment and plan:  Problem List Items Addressed This Visit    Testosterone deficiency    Receiving  testosterone replacement.  Follow cbc and psa.       Relevant Orders   CBC with Differential/Platelet   PSA   Mixed hyperlipidemia    On crestor.  Low cholesterol diet and exercise.  Follow lipid panel and liver function tests.        Relevant Orders   Hepatic function panel   Lipid panel   Mild depression ([place])    On zoloft.  Doing better.  Follow.       Hypertension, essential    On hctz and amlodipine.  Blood pressures have been better.  Follow pressures.  Follow metabolic panel.        Hepatic steatosis    Found on recent ultrasound.  Has adjusted diet.  Sugars better.  Diet, exercise and  weight loss.  Follow liver function tests.        ETOH abuse    Recent pancreatitis.  Has cut down alcohol intake.  Discussed importance of not drinking.  Follow.       Disturbed concentration    Trouble focusing and concentrating.  Discussed.  Desires testing for ADHD.  Refer for testing.        Relevant Orders   Ambulatory referral to Psychology   Diabetes mellitus ([place])    He has adjusted his diet.  On metformin, jardiance and trulicity.  Sugars overall improved.  Continue low carb diet and exercise.  Follow met b and a1c.  Interested in continuous glucose monitoring.  Will check with Catie regarding what we would need to do to get this arranged.        Relevant Orders   Ambulatory referral to Chronic Care Management Services   Hemoglobin U2J   Basic metabolic panel   Abnormal liver function tests    Keep sugars under good control.  Has seen GI.  Following.  Follow liver function tests.            I discussed the assessment and treatment plan with the patient. The patient was provided an opportunity to ask questions and all were answered. The patient agreed with the plan and demonstrated an understanding of the instructions.   The patient was advised to call back or seek an in-person evaluation if the symptoms worsen or if the condition fails to improve as anticipated.  I  provided 25 minutes of non-face-to-face time during this encounter.   Einar Pheasant, MD

## 2020-08-05 ENCOUNTER — Other Ambulatory Visit: Payer: Self-pay | Admitting: Internal Medicine

## 2020-08-10 ENCOUNTER — Encounter: Payer: Self-pay | Admitting: Internal Medicine

## 2020-08-10 DIAGNOSIS — R41840 Attention and concentration deficit: Secondary | ICD-10-CM | POA: Insufficient documentation

## 2020-08-10 NOTE — Assessment & Plan Note (Signed)
Trouble focusing and concentrating.  Discussed.  Desires testing for ADHD.  Refer for testing.

## 2020-08-10 NOTE — Assessment & Plan Note (Signed)
Keep sugars under good control.  Has seen GI.  Following.  Follow liver function tests.

## 2020-08-10 NOTE — Assessment & Plan Note (Signed)
He has adjusted his diet.  On metformin, jardiance and trulicity.  Sugars overall improved.  Continue low carb diet and exercise.  Follow met b and a1c.  Interested in continuous glucose monitoring.  Will check with Catie regarding what we would need to do to get this arranged.

## 2020-08-10 NOTE — Assessment & Plan Note (Signed)
On hctz and amlodipine.  Blood pressures have been better.  Follow pressures.  Follow metabolic panel.

## 2020-08-10 NOTE — Assessment & Plan Note (Signed)
Found on recent ultrasound.  Has adjusted diet.  Sugars better.  Diet, exercise and weight loss.  Follow liver function tests.

## 2020-08-10 NOTE — Assessment & Plan Note (Signed)
Receiving testosterone replacement.  Follow cbc and psa.

## 2020-08-10 NOTE — Assessment & Plan Note (Signed)
On crestor.  Low cholesterol diet and exercise.  Follow lipid panel and liver function tests.   

## 2020-08-10 NOTE — Assessment & Plan Note (Signed)
On zoloft.  Doing better.  Follow.   

## 2020-08-10 NOTE — Assessment & Plan Note (Signed)
Recent pancreatitis.  Has cut down alcohol intake.  Discussed importance of not drinking.  Follow.

## 2020-08-22 ENCOUNTER — Telehealth: Payer: Self-pay

## 2020-08-22 NOTE — Chronic Care Management (AMB) (Signed)
  Care Management   Note  08/22/2020 Name: Francisco Jackson MRN: [id number] DOB: May 11, 1976  Francisco Jackson is a 44 y.o. year old male who is a primary care patient of Einar Pheasant, MD and is actively engaged with the care management team. I reached out to Celene Squibb by phone today to assist with re-scheduling a follow up visit with the Pharmacist  Follow up plan: Unsuccessful telephone outreach attempt made. A HIPAA compliant phone message was left for the patient providing contact information and requesting a return call.  The care management team will reach out to the patient again over the next 7 days.  If patient returns call to provider office, please advise to call [facility]  at [place] [place], [place], [place], [place] [address] Direct Dial: [phone] [email or web address]: [email or web address]

## 2020-08-29 ENCOUNTER — Telehealth: Payer: Self-pay

## 2020-08-29 NOTE — Chronic Care Management (AMB) (Signed)
  Care Management   Note  08/29/2020 Name: Francisco Jackson MRN: [id number] DOB: 06/24/1976  Francisco Jackson is a 44 y.o. year old male who is a primary care patient of Einar Pheasant, MD and is actively engaged with the care management team. I reached out to Celene Squibb by phone today to assist with re-scheduling a follow up visit with the Pharmacist  Follow up plan: Unsuccessful telephone outreach attempt made. A HIPAA compliant phone message was left for the patient providing contact information and requesting a return call.  The care management team will reach out to the patient again over the next 2 days.  If patient returns call to provider office, please advise to call [facility]  at [place], [place], [place], [place] [address] Direct Dial: [phone] [email or web address] Website: [email or web address]

## 2020-09-01 ENCOUNTER — Ambulatory Visit: Payer: 59 | Admitting: Pharmacist

## 2020-09-01 DIAGNOSIS — E782 Mixed hyperlipidemia: Secondary | ICD-10-CM

## 2020-09-01 DIAGNOSIS — E1165 Type 2 diabetes mellitus with hyperglycemia: Secondary | ICD-10-CM

## 2020-09-01 DIAGNOSIS — I10 Essential (primary) hypertension: Secondary | ICD-10-CM

## 2020-09-01 MED ORDER — FREESTYLE LIBRE 2 SENSOR MISC: 3 refills | Status: DC

## 2020-09-01 MED ORDER — FREESTYLE LIBRE 2 SENSOR MISC: 11 refills | Status: DC

## 2020-09-01 NOTE — Patient Instructions (Signed)
Visit Information  Patient Care Plan: Medication Management    Problem Identified: Diabetes, Hypertension, Hyperlipidemia     Long-Range Goal: Disease Management Maintenance   This Visit's Progress: On track  Note:   Current Barriers:  . Unable to independently monitor therapeutic efficacy . Complex patient with multiple comorbidities including diabetes, hypertension, hyperlipidemia, recent episode of pancreatitis  Pharmacist Clinical Goal(s):  Marland Kitchen Over the next 90 days, patient will maintain control of diabetes as evidenced by A1c collaboration with PharmD and provider.   Interventions: . Inter-disciplinary care team collaboration (see longitudinal plan of care) . Comprehensive medication review performed; medication list updated in electronic medical record  Diabetes: . Controlled; current treatment: metformin XR 1000 mg BID, Jardiance 10 mg daily, Trulicity 1.5 mg weekly . Discussed use of GLP1 post pancreatitis w/ GI. They indicated to continue current regimen at this time.  . Current glucose readings: not checking regularly, interested in use of CGM . Recommended use of CGM . Script sent to [facility]. PA required, and they note DexCom is preferred, though this will be more expensive than $75/month even if covered, given multiple parts of the script. Patient amenable to $75/month cost of [place] sensors at [place]. Script sent. Patient declines education visit, notes he has used before.   Hypertension: . Controlled per most recent clinic and home BP; current treatment: lisinopril 5 mg daily, amlodipine 10 mg daily, HCTZ 12.5 mg daily  . Recommended to continue current regimen  Hyperlipidemia: . Uncontrolled though likely improved with recent dosing frequency of rosuvastatin; current treatment: rosuvastatin 5 mg daily. Denies any intolerability concerns with increased dose. Updated lipid panel recommended with next lab work  . Recommended to continue current  regimen  Asthma: . Controlled; current treatment: montelukast 10 mg daily; script for Flovent HFA and albuterol HFA PRN, but they have not been filled recently.  . Recommended to continue current regimen at this time.  Depression: . Well managed per patient report; current regimen; sertraline 100 mg daily . Recommend to continue current regimen at this time  Low Testosterone: . Controlled per last testosterone level; testosterone 30 mg pump, 2 pumps daily.  . Recommended to continue current regimen at this time.  Patient Goals/Self-Care Activities . Over the next 90 days, patient will:  - take medications as prescribed check blood glucose using CGM, document, and provide at future appointments  Follow Up Plan: Telephone follow up appointment with care management team member scheduled for: ~ 6 weeks        The patient verbalized understanding of instructions, educational materials, and care plan provided today and declined offer to receive copy of patient instructions, educational materials, and care plan.    Plan: Telephone follow up appointment with care management team member scheduled for: ~ 6 weeks  Catie Darnelle Maffucci, PharmD, [place], [place] Pharmacist [place] [phone]

## 2020-09-01 NOTE — Chronic Care Management (AMB) (Signed)
**Note Francisco-Identified via Obfuscation** Care Management   Pharmacy Note  09/01/2020 Name: Francisco Jackson MRN: [id number] DOB: December 06, 1975   Subjective:  Francisco Jackson is a 44 y.o. year old male who is a primary care patient of Francisco Jackson. The Care Management/Care Coordination team team was consulted for assistance with care management and care coordination needs.    Engaged with patient by telephone for follow up visit in response to provider referral for pharmacy case management and/or care coordination services.   Consent to Services:  [name] was given information about care management/care coordination services, agreed to services, and gave verbal consent prior to initiation of services. Please see initial visit note for detailed documentation.   Review of patient status, including review of consultants reports, laboratory and other test data, was performed as part of comprehensive evaluation and provision of chronic care management services.   SDOH (Social Determinants of Health) assessments and interventions performed:  SDOH Interventions     Most Recent Value  SDOH Interventions  Financial Strain Interventions Intervention Not Indicated       Objective:  Lab Results  Component Value Date   CREATININE 1.05 05/28/2020   CREATININE 1.04 05/06/2020   CREATININE 0.96 05/02/2020    Lab Results  Component Value Date   HGBA1C 6.2 05/28/2020       Component Value Date/Time   CHOL 177 05/28/2020 1101   CHOL 110 06/14/2019 0828   TRIG 158.0 (H) 05/28/2020 1101   HDL 37.30 (L) 05/28/2020 1101   HDL 30 (L) 06/14/2019 0828   CHOLHDL 5 05/28/2020 1101   VLDL 31.6 05/28/2020 1101   LDLCALC 108 (H) 05/28/2020 1101   LDLCALC 60 06/14/2019 0828   Lab Results  Component Value Date   TESTOSTERONE 338.16 05/28/2020     Clinical ASCVD: No  The 10-year ASCVD risk score Francisco Jackson., et al., 2013) is: 5.4%   Values used to calculate the score:     Age: 21 years     Sex: Male     Is  Non-Hispanic African American: No     Diabetic: Yes     Tobacco smoker: No     Systolic Blood Pressure: 300 mmHg     Is BP treated: Yes     HDL Cholesterol: 37.3 mg/dL     Total Cholesterol: 177 mg/dL    Other: (CHADS2VASc if Afib, PHQ9 if depression, MMRC or CAT for COPD, ACT)  BP Readings from Last 3 Encounters:  05/06/20 110/80  05/03/20 118/81  05/01/20 118/84    Assessment:   Allergies  Allergen Reactions  . Losartan     Severe Chest pain.    Medications Reviewed Today    Reviewed by Francisco Jackson, RPH-CPP (Pharmacist) on 09/01/20 at [place] List Status: <None>  Medication Order Taking? Sig Documenting Provider Last Dose Status Informant  albuterol (PROVENTIL HFA;VENTOLIN HFA) 108 (90 BASE) MCG/ACT inhaler [id number] No Inhale 1-2 puffs into the lungs every 6 (six) hours as needed.  Patient not taking: Reported on 09/01/2020   Francisco Daring, PA-C Not Taking Active   amLODipine (NORVASC) 10 MG tablet [id number] Yes TAKE 1 TABLET BY MOUTH DAILY Francisco Jackson Taking Active   azelastine (ASTELIN) 0.1 % nasal spray [id number] Yes  Provider, Historical, Jackson Taking Active   azelastine (OPTIVAR) 0.05 % ophthalmic solution [id number] Yes Apply 1 drop to eye 2 (two) times daily. Provider, Historical, Jackson Taking Active   cholecalciferol (VITAMIN D3) 25 MCG (1000 UNIT) tablet [id number] Yes  Take 1,000 Units by mouth daily. Provider, Historical, Jackson Taking Active   Continuous Blood Gluc Sensor (FREESTYLE LIBRE 2 SENSOR) [place] [id number]  Use to check glucose at least TID Francisco Jackson  Active   FLOVENT [facility] 110 MCG/ACT inhaler [id number] No INHALE 2 PUFFS INTO THE LUNGS TWICE DAILY  Patient not taking: Reported on 09/01/2020   Francisco Jackson Not Taking Active   fluticasone (FLONASE) 50 MCG/ACT nasal spray [id number]  USE 2 SPRAYS IN EACH NOSTRIL DAILY Francisco Jackson  Active   hydrochlorothiazide (MICROZIDE) 12.5 MG capsule [id number] Yes TAKE 1 CAPSULE BY MOUTH  ONCE DAILY Francisco Jackson Taking Active   Insulin Pen Needle (PEN NEEDLES) 30G X 5 MM MISC [id number]  Inject 1 Applicatorful into the skin once a week. Use with trulicity pen Guse, Jacquelynn Cree, Jackson  Active   JARDIANCE 10 MG TABS tablet [id number] Yes TAKE 1 TABLET BY MOUTH DAILY BEFORE Francisco Jackson Patient, Jackson Taking Active   lisinopril (ZESTRIL) 5 MG tablet [id number] Yes TAKE 1 TABLET BY MOUTH DAILY Francisco Jackson Taking Active   metFORMIN (GLUCOPHAGE-XR) 500 MG 24 hr tablet [id number] Yes Take 2 tablets (1,000 mg total) by mouth 2 (two) times daily. Francisco Jackson Taking Active   montelukast (SINGULAIR) 10 MG tablet [id number] Yes TAKE ONE TABLET EVERY MORNING Francisco Jackson Taking Active   Multiple Vitamin (MULTIVITAMIN) tablet [id number] Yes Take 1 tablet by mouth daily. Provider, Historical, Jackson Taking Active   rosuvastatin (CRESTOR) 5 MG tablet [id number] Yes Take 1 tablet (5 mg total) by mouth daily. Francisco Jackson Taking Active   sertraline (ZOLOFT) 100 MG tablet [id number] Yes TAKE 1 TABLET BY MOUTH DAILY Francisco Jackson Taking Active   sildenafil (REVATIO) 20 MG tablet [id number] Yes Take 2-4 tablets two hours before intercouse on an empty stomach.  Do not take with nitrates. Francisco Jackson Taking Active   Testosterone 30 MG/ACT Bailey Mech [id number] Yes PLACE 2 PUMPS ONTO THE SKIN OF AXILLA EVERY MORNING Francisco Jackson Taking Active   TRULICITY 1.5 NI/7.7OE SOPN [id number]  Inject 1.5 mg into the skin once a week.  Provider, Historical, Jackson  Active           Patient Active Problem List   Diagnosis Date Noted  . Disturbed concentration 08/10/2020  . Hepatic steatosis 05/02/2020  . Pancreatitis, acute 05/02/2020  . Testosterone deficiency 04/16/2020  . Healthcare maintenance 11/02/2019  . ETOH abuse 06/24/2019  . Mild depression ([place]) 06/20/2019  . BMI 33.0-33.9,adult 06/04/2019  . Neck fullness 06/04/2019  . Type 2 diabetes mellitus without complication,  without long-term current use of insulin ([place]) 07/17/2018  . Other mixed anxiety disorders 07/17/2018  . Witnessed apneic spells 01/24/2018  . Pneumonia 01/11/2018  . Abnormal liver function tests 08/15/2017  . Erectile dysfunction 06/20/2017  . Chest pain 12/20/2016  . Stress 10/24/2016  . Hypertension, essential 10/24/2016  . Diabetes mellitus ([place]) 08/01/2016  . Mixed hyperlipidemia 08/01/2016  . Frequent episodes of bronchitis 08/01/2016    Medication Assistance: None required. Patient affirms current coverage meets needs.     Patient Care Plan: Medication Management    Problem Identified: Diabetes, Hypertension, Hyperlipidemia     Long-Range Goal: Disease Management Maintenance   This Visit's Progress: On track  Note:   Current Barriers:  . Unable to independently monitor therapeutic efficacy . Complex patient with multiple comorbidities including diabetes, hypertension, hyperlipidemia, recent episode of pancreatitis  Pharmacist Clinical Goal(s):  Marland Kitchen Over the next 90  days, patient will maintain control of diabetes as evidenced by A1c collaboration with PharmD and provider.   Interventions: . Inter-disciplinary care team collaboration (see longitudinal plan of care) . Comprehensive medication review performed; medication list updated in electronic medical record  Diabetes: . Controlled; current treatment: metformin XR 1000 mg BID, Jardiance 10 mg daily, Trulicity 1.5 mg weekly . Discussed use of GLP1 post pancreatitis w/ GI. They indicated to continue current regimen at this time.  . Current glucose readings: not checking regularly, interested in use of CGM . Recommended use of CGM . Script sent to [facility]. PA required, and they note DexCom is preferred, though this will be more expensive than $75/month even if covered, given multiple parts of the script. Patient amenable to $75/month cost of [place] sensors at [place]. Script sent. Patient declines education  visit, notes he has used before.   Hypertension: . Controlled per most recent clinic and home BP; current treatment: lisinopril 5 mg daily, amlodipine 10 mg daily, HCTZ 12.5 mg daily  . Recommended to continue current regimen  Hyperlipidemia: . Uncontrolled though likely improved with recent dosing frequency of rosuvastatin; current treatment: rosuvastatin 5 mg daily. Denies any intolerability concerns with increased dose. Updated lipid panel recommended with next lab work  . Recommended to continue current regimen  Asthma: . Controlled; current treatment: montelukast 10 mg daily; script for Flovent HFA and albuterol HFA PRN, but they have not been filled recently.  . Recommended to continue current regimen at this time.  Depression: . Well managed per patient report; current regimen; sertraline 100 mg daily . Recommend to continue current regimen at this time  Low Testosterone: . Controlled per last testosterone level; testosterone 30 mg pump, 2 pumps daily.  . Recommended to continue current regimen at this time.  Patient Goals/Self-Care Activities . Over the next 90 days, patient will:  - take medications as prescribed check blood glucose using CGM, document, and provide at future appointments  Follow Up Plan: Telephone follow up appointment with care management team member scheduled for: ~ 6 weeks         Plan: Telephone follow up appointment with care management team member scheduled for: ~ 6 weeks  Catie Darnelle Maffucci, PharmD, [place], [place] Pharmacist [place] [phone]

## 2020-09-02 ENCOUNTER — Other Ambulatory Visit: Payer: Self-pay | Admitting: Internal Medicine

## 2020-09-03 NOTE — Progress Notes (Signed)
Pt visit was completed by pharm D

## 2020-09-03 NOTE — Telephone Encounter (Signed)
rx sent in for testosterone gel..  

## 2020-09-03 NOTE — Progress Notes (Signed)
Pt visit was completed

## 2020-09-23 DIAGNOSIS — G4700 Insomnia, unspecified: Secondary | ICD-10-CM | POA: Diagnosis not present

## 2020-09-23 DIAGNOSIS — Z6833 Body mass index (BMI) 33.0-33.9, adult: Secondary | ICD-10-CM | POA: Diagnosis not present

## 2020-09-23 DIAGNOSIS — G2581 Restless legs syndrome: Secondary | ICD-10-CM | POA: Diagnosis not present

## 2020-09-23 DIAGNOSIS — G4733 Obstructive sleep apnea (adult) (pediatric): Secondary | ICD-10-CM | POA: Diagnosis not present

## 2020-09-23 DIAGNOSIS — I10 Essential (primary) hypertension: Secondary | ICD-10-CM | POA: Diagnosis not present

## 2020-09-23 DIAGNOSIS — G4710 Hypersomnia, unspecified: Secondary | ICD-10-CM | POA: Diagnosis not present

## 2020-09-23 DIAGNOSIS — J3089 Other allergic rhinitis: Secondary | ICD-10-CM | POA: Diagnosis not present

## 2020-09-23 DIAGNOSIS — E669 Obesity, unspecified: Secondary | ICD-10-CM | POA: Diagnosis not present

## 2020-09-23 DIAGNOSIS — R69 Illness, unspecified: Secondary | ICD-10-CM | POA: Diagnosis not present

## 2020-09-29 DIAGNOSIS — G4733 Obstructive sleep apnea (adult) (pediatric): Secondary | ICD-10-CM | POA: Diagnosis not present

## 2020-10-02 ENCOUNTER — Other Ambulatory Visit: Payer: Self-pay | Admitting: Internal Medicine

## 2020-10-02 DIAGNOSIS — R519 Headache, unspecified: Secondary | ICD-10-CM | POA: Diagnosis not present

## 2020-10-02 DIAGNOSIS — M9902 Segmental and somatic dysfunction of thoracic region: Secondary | ICD-10-CM | POA: Diagnosis not present

## 2020-10-02 DIAGNOSIS — M9901 Segmental and somatic dysfunction of cervical region: Secondary | ICD-10-CM | POA: Diagnosis not present

## 2020-10-02 DIAGNOSIS — M5414 Radiculopathy, thoracic region: Secondary | ICD-10-CM | POA: Diagnosis not present

## 2020-10-04 DIAGNOSIS — G4733 Obstructive sleep apnea (adult) (pediatric): Secondary | ICD-10-CM | POA: Diagnosis not present

## 2020-10-07 NOTE — Telephone Encounter (Signed)
rx ok'd for testosterone 

## 2020-10-11 ENCOUNTER — Encounter: Payer: Self-pay | Admitting: Internal Medicine

## 2020-10-13 ENCOUNTER — Other Ambulatory Visit: Payer: Self-pay | Admitting: Internal Medicine

## 2020-10-13 NOTE — Telephone Encounter (Signed)
Yes Ok to refill.

## 2020-10-14 NOTE — Telephone Encounter (Signed)
Trulicity sent in

## 2020-10-20 ENCOUNTER — Telehealth: Payer: Self-pay | Admitting: Pharmacist

## 2020-10-20 ENCOUNTER — Telehealth: Payer: 59

## 2020-10-20 NOTE — Telephone Encounter (Signed)
  Chronic Care Management   Note  10/20/2020 Name: Francisco Jackson MRN: [id number] DOB: 28-Jun-1976   Attempted to contact patient for scheduled appointment for medication management support. Unable to LVM as mailbox was full  Plan: - If I do not hear back from the patient by end of business today, will collaborate with Care Guide to outreach to schedule follow up with me   Catie Darnelle Maffucci, PharmD, [place], [place] Pharmacist [employer] at [place]

## 2020-10-23 ENCOUNTER — Telehealth: Payer: Self-pay

## 2020-10-23 NOTE — Chronic Care Management (AMB) (Signed)
  Care Management   Note  10/23/2020 Name: NEERAJ HOUSAND MRN: [id number] DOB: 1976-01-17  DONNEL VENUTO is a 45 y.o. year old male who is a primary care patient of Einar Pheasant, MD and is actively engaged with the care management team. I reached out to Celene Squibb by phone today to assist with re-scheduling a follow up visit with the Pharmacist  Follow up plan: Unsuccessful telephone outreach attempt made. A HIPAA compliant phone message was left for the patient providing contact information and requesting a return call.  The care management team will reach out to the patient again over the next 7 days.  If patient returns call to provider office, please advise to call [facility]  at [place], [place], [place], [place] [address] Direct Dial: [phone] [email or web address] Website: [email or web address]

## 2020-10-30 ENCOUNTER — Other Ambulatory Visit: Payer: Self-pay

## 2020-10-30 ENCOUNTER — Other Ambulatory Visit: Payer: Self-pay | Admitting: *Deleted

## 2020-10-30 NOTE — Patient Outreach (Signed)
[place] Francisco Jackson [facility]) Care Management  10/30/2020  SEAB AXEL 12/12/1975 [id number]   [facility] outreach and transfer of services to [facility] embedded program Mr JARIAN LONGORIA was referred to [facility] on 07/16/20 as [facility] referral for  Referral Reason:Complex CM and the HTN initiative-non-urgent follow up in <10 days per [place].  Additional information:DM, A1C 6.2, Elevated lipids, pancreatitis, * CCM w/Catie.   Insurance:[employer] medicare Last admission 11/01/19 colonoscopy Last Ed 05/03/20 abdominal pain-pancreatitis  Outreach successful to patient Patient is able to verify HIPAA ([place]) identifiers Reviewed and addressed the purpose of the follow up call with the patient  Consent: [facility] RN CM reviewed [facility] services with patient. 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 He reports resolved hand ligament He denies questions related to [facility] materials sent  [facility] RN CM reviewed the transfer from [facility] complex care to [facility] embedded care services He voices understanding and voices appreciation for [facility] RN CM outreach He agrees to outreach from upcoming [facility] embedded RN CM F Tarpley  Plans [place] transfer to [facility] embedded services [facility] Complex case closure Goals Addressed              This Visit's Progress     Patient Stated   .  COMPLETED: [facility]) Make and Keep All Appointments (pt-stated)   On track     Follow Up Date 10/30/20  - call to cancel if needed - keep a calendar with appointment dates      Notes: met no missed appointments     .  [facility]) Track and Manage My Blood Pressure (pt-stated)   On track     Follow Up Date pending outreach from embedded staff   - check blood pressure daily - choose a place to take my blood pressure (home, clinic or office, retail store)      Notes: 10/30/20  continues to manage well without issues        Fayne Mcguffee L. Lavina Hamman, RN, BSN, [place] Coordinator Office number [phone] [facility] number [phone] Fax number [phone]

## 2020-11-05 ENCOUNTER — Other Ambulatory Visit: Payer: Self-pay | Admitting: Internal Medicine

## 2020-11-06 ENCOUNTER — Other Ambulatory Visit: Payer: Self-pay | Admitting: Internal Medicine

## 2020-11-13 ENCOUNTER — Telehealth: Payer: Self-pay | Admitting: *Deleted

## 2020-11-13 NOTE — Telephone Encounter (Signed)
   11/13/2020  Francisco Jackson [birth year] [id number]    Outreach Attempt:  Outreach attempt #1 to patient for engagement of Chronic Care Management services. No answer. RN CM left voicemail message along with contact information.  Plan:  RN CM will make another outreach attempt within the next 10 business days.  Hubert Azure RN RN Care Management Coordinator  [phone] [email or web address]

## 2020-11-17 NOTE — Chronic Care Management (AMB) (Signed)
  Care Management   Note  11/17/2020 Name: Francisco Jackson MRN: [id number] DOB: 26-Sep-1976  Francisco Jackson is a 45 y.o. year old male who is a primary care patient of Einar Pheasant, MD and is actively engaged with the care management team. I reached out to Celene Squibb by phone today to assist with re-scheduling a follow up visit with the Pharmacist  Follow up plan: Telephone appointment with care management team member scheduled for:11/19/2020  Noreene Larsson, [place], [place], [place] [address] Direct Dial: [phone] [email or web address]: [email or web address]

## 2020-11-19 ENCOUNTER — Ambulatory Visit: Payer: 59 | Admitting: Pharmacist

## 2020-11-19 DIAGNOSIS — E782 Mixed hyperlipidemia: Secondary | ICD-10-CM

## 2020-11-19 DIAGNOSIS — K760 Fatty (change of) liver, not elsewhere classified: Secondary | ICD-10-CM

## 2020-11-19 DIAGNOSIS — E1165 Type 2 diabetes mellitus with hyperglycemia: Secondary | ICD-10-CM

## 2020-11-19 DIAGNOSIS — I10 Essential (primary) hypertension: Secondary | ICD-10-CM

## 2020-11-19 NOTE — Chronic Care Management (AMB) (Signed)
Care Management   Pharmacy Note  11/19/2020 Name: Francisco Jackson MRN: [id number] DOB: 08/29/1976  Subjective: Francisco Jackson is a 45 y.o. year old male who is a primary care patient of Einar Pheasant, MD. The Care Management team was consulted for assistance with care management and care coordination needs.    Engaged with patient by telephone for follow up visit in response to provider referral for pharmacy case management and/or care coordination services.   The patient was given information about Care Management services today including:  1. Care Management services includes personalized support from designated clinical staff supervised by the patient's primary care provider, including individualized plan of care and coordination with other care providers. 2. 24/7 contact phone numbers for assistance for urgent and routine care needs. 3. The patient may stop case management services at any time by phone call to the office staff.  Patient agreed to services and consent obtained.  Assessment:  Review of patient status, including review of consultants reports, laboratory and other test data, was performed as part of comprehensive evaluation and provision of chronic care management services.   SDOH (Social Determinants of Health) assessments and interventions performed:  SDOH Interventions   Flowsheet Row Most Recent Value  SDOH Interventions   Financial Strain Interventions Intervention Not Indicated       Objective:  Lab Results  Component Value Date   CREATININE 1.05 05/28/2020   CREATININE 1.04 05/06/2020   CREATININE 0.96 05/02/2020    Lab Results  Component Value Date   HGBA1C 6.2 05/28/2020       Component Value Date/Time   CHOL 177 05/28/2020 1101   CHOL 110 06/14/2019 0828   TRIG 158.0 (H) 05/28/2020 1101   HDL 37.30 (L) 05/28/2020 1101   HDL 30 (L) 06/14/2019 0828   CHOLHDL 5 05/28/2020 1101   VLDL 31.6 05/28/2020 1101   LDLCALC 108 (H) 05/28/2020  1101   LDLCALC 60 06/14/2019 0828    Clinical ASCVD: No  The 10-year ASCVD risk score Mikey Bussing DC Jr., et al., 2013) is: 5.4%   Values used to calculate the score:     Age: 75 years     Sex: Male     Is Non-Hispanic African American: No     Diabetic: Yes     Tobacco smoker: No     Systolic Blood Pressure: 235 mmHg     Is BP treated: Yes     HDL Cholesterol: 37.3 mg/dL     Total Cholesterol: 177 mg/dL     BP Readings from Last 3 Encounters:  05/06/20 110/80  05/03/20 118/81  05/01/20 118/84    Care Plan  Allergies  Allergen Reactions  . Losartan     Severe Chest pain.    Medications Reviewed Today    Reviewed by De Hollingshead, RPH-CPP (Pharmacist) on 11/19/20 at [phone]  Med List Status: <None>  Medication Order Taking? Sig Documenting Provider Last Dose Status Informant  albuterol (PROVENTIL HFA;VENTOLIN HFA) 108 (90 BASE) MCG/ACT inhaler [id number] No Inhale 1-2 puffs into the lungs every 6 (six) hours as needed.  Patient not taking: No sig reported   Mar Daring, [place] Not Taking Active   amLODipine (NORVASC) 10 MG tablet [id number] Yes TAKE 1 TABLET BY MOUTH DAILY Einar Pheasant, MD Taking Active   azelastine (ASTELIN) 0.1 % nasal spray [id number] Yes as needed. [provider] Taking Active   azelastine (OPTIVAR) 0.05 % ophthalmic solution [id number] Yes Apply 1 drop to eye as needed. [provider] Taking Active   cholecalciferol (VITAMIN D3) 25 MCG (1000 UNIT) tablet [id number] Yes Take 1,000 Units by mouth daily. [provider] Taking Active   Continuous Blood Gluc Sensor (FREESTYLE LIBRE 2 SENSOR) [place] [id number] Yes Use to check glucose at least TID Einar Pheasant, MD Taking Active   FLOVENT HFA 110 MCG/ACT inhaler [id number] No INHALE 2 PUFFS INTO THE LUNGS TWICE DAILY  Patient not taking: No sig reported   Einar Pheasant, MD Not Taking Active   fluticasone (FLONASE) 50 MCG/ACT nasal spray [id number] Yes USE 2 SPRAYS IN EACH  NOSTRIL DAILY Einar Pheasant, MD Taking Active   hydrochlorothiazide (MICROZIDE) 12.5 MG capsule [id number] Yes TAKE 1 CAPSULE BY MOUTH ONCE DAILY Einar Pheasant, MD Taking Active   Insulin Pen Needle (PEN NEEDLES) 30G X 5 MM MISC [id number] Yes Inject 1 Applicatorful into the skin once a week. Use with trulicity pen Guse, Jacquelynn Cree, FNP Taking Active   JARDIANCE 10 MG TABS tablet [id number] Yes TAKE 1 TABLET BY MOUTH DAILY BEFORE [place] Ellison, Randell Patient, MD Taking Active   lisinopril (ZESTRIL) 5 MG tablet [id number] Yes TAKE 1 TABLET BY MOUTH DAILY Einar Pheasant, MD Taking Active   metFORMIN (GLUCOPHAGE-XR) 500 MG 24 hr tablet [id number] Yes TAKE 2 TABLETS BY MOUTH 2 TIMES A Maxcine Ham, Randell Patient, MD Taking Active   montelukast (SINGULAIR) 10 MG tablet [id number] Yes TAKE ONE TABLET EVERY MORNING Einar Pheasant, MD Taking Active   Multiple Vitamin (MULTIVITAMIN) tablet [id number] Yes Take 1 tablet by mouth daily. [provider] Taking Active   rosuvastatin (CRESTOR) 5 MG tablet [id number] Yes Take 1 tablet (5 mg total) by mouth daily. Einar Pheasant, MD Taking Active   sertraline (ZOLOFT) 100 MG tablet [id number] Yes TAKE 1 TABLET BY MOUTH DAILY Einar Pheasant, MD Taking Active   sildenafil (REVATIO) 20 MG tablet [id number] Yes Take 2-4 tablets two hours before intercouse on an empty stomach.  Do not take with nitrates. Einar Pheasant, MD Taking Active   Testosterone 30 MG/ACT Bailey Mech [id number] Yes PLACE 2 PUMPS ONTO THE SKIN OF AXILLA EVERY MORNING Einar Pheasant, MD Taking Active   TRULICITY 1.5 BH/4.1PF SOPN [id number] Yes INJECT 1 PEN ( 1.5 MG ) SUBCUTANEOUSLY ONCE EVERY 7 DAYS. ** NEED APPT**. Einar Pheasant, MD Taking Active           Patient Active Problem List   Diagnosis Date Noted  . Disturbed concentration 08/10/2020  . Hepatic steatosis 05/02/2020  . Pancreatitis, acute 05/02/2020  . Testosterone deficiency 04/16/2020  . Healthcare maintenance 11/02/2019  . ETOH abuse  06/24/2019  . Mild depression ([place]) 06/20/2019  . BMI 33.0-33.9,adult 06/04/2019  . Neck fullness 06/04/2019  . Type 2 diabetes mellitus without complication, without long-term current use of insulin ([place]) 07/17/2018  . Other mixed anxiety disorders 07/17/2018  . Witnessed apneic spells 01/24/2018  . Pneumonia 01/11/2018  . Abnormal liver function tests 08/15/2017  . Erectile dysfunction 06/20/2017  . Chest pain 12/20/2016  . Stress 10/24/2016  . Hypertension, essential 10/24/2016  . Diabetes mellitus ([place]) 08/01/2016  . Mixed hyperlipidemia 08/01/2016  . Frequent episodes of bronchitis 08/01/2016    Conditions to be addressed/monitored: HTN, HLD, Hypertriglyceridemia and DMII  Care Plan : Medication Management  Updates made by De Hollingshead, RPH-CPP since 11/19/2020 12:00 AM  Completed 11/19/2020  Problem: Diabetes, Hypertension, Hyperlipidemia Resolved 11/19/2020    Long-Range Goal: Disease Management Maintenance Completed 11/19/2020  Recent Progress: On track  Note:   Current Barriers:  . Complex patient with  multiple comorbidities including diabetes, hypertension, hyperlipidemia, recent episode of pancreatitis  Pharmacist Clinical Goal(s):  Marland Kitchen Over the next 90 days, patient will maintain control of diabetes as evidenced by A1c collaboration with PharmD and provider.   Interventions: . 1:1 collaboration with Einar Pheasant, MD regarding development and update of comprehensive plan of care as evidenced by provider attestation and co-signature . Inter-disciplinary care team collaboration (see longitudinal plan of care) . Comprehensive medication review performed; medication list updated in electronic medical   Health Maintenance: . Overdue for f/u with Dr. Nicki Reaper. Will collaborate w/ office staff to call patient to schedule f/u.   Diabetes: . Controlled; current treatment: metformin XR 1000 mg BID, Jardiance 10 mg daily, Trulicity 1.5 mg weekly . Denies any concerns w/  stomach upset, nausea, vomiting, constipation; GU infections.  . Denies any s/sx hypoglycemia.  . Current glucose readings: using Libre 2 CGM. Reports he is within goal range ~75-80% of the time. Appreciates using CGM to better monitor impact of diet choices . Continue current regimen at this time. Discussed need for f/u and lab work. If A1c elevated, consider increasing dose of Trulicity or Jardiance.  Durene Cal LibreView link so that glucose readings can be remotely viewed and downloaded.   Hypertension: . Controlled per most recent clinic reading; current treatment: lisinopril 5 mg daily, amlodipine 10 mg daily, HCTZ 12.5 mg daily  . Home BP readings: patient not checking at home. Confirmed he has BP machine.  . Encouraged occasional home checks to ensure maintenance of goal BP. Patient verbalized understanding . Recommended to continue current regimen  at this time  Hyperlipidemia: . Uncontrolled though likely improved with recent dosing frequency of rosuvastatin; current treatment: rosuvastatin 5 mg daily.  . Overdue for lab work. Will get scheduled for PCP f/u and lab work . Recommended to continue current regimen  at this time  Asthma/Allergies: . Controlled; current treatment: montelukast 10 mg daily; script for Flovent HFA and albuterol HFA PRN, but he reports he has not needed to use in a long time. PRN use of nasal fluticasone and nasal azelastine, as well as azelastine eye drops.  . Requested refill on Flovent and albuterol. Encouraged to discuss w/ PCP at next visit prior to sending refills.  . Recommended to continue current regimen at this time.  Depression: . Well managed per patient report; current regimen; sertraline 100 mg daily . Recommend to continue current regimen at this time  Low Testosterone: . Controlled per last testosterone level; testosterone 30 mg pump, 2 pumps daily; sildenafil 20 mg PRN . Recommended to continue current regimen at this time.  Patient  Goals/Self-Care Activities . Over the next 90 days, patient will:  - take medications as prescribed check glucose using CGM, document, and provide at future appointments check blood pressure weekly, document, and provide at future appointments  Follow Up Plan: Patient declines to continue CCM services at this time.         Medication Assistance:  None required.  Patient affirms current coverage meets needs.  Follow Up:  Patient requests no follow-up at this time.  Plan: Patient declines CCM services at this time. Closing case. He has my contact information for future questions or concerns.   Catie Darnelle Maffucci, PharmD, [place], [place] Clinical Pharmacist [employer] at [place]

## 2020-11-19 NOTE — Patient Instructions (Signed)
Visit Information  Goals Addressed              This Visit's Progress     Patient Stated   .  COMPLETED: PharmD - Medication Monitoring (pt-stated)        Patient Goals/Self-Care Activities . Over the next 90 days, patient will:  - take medications as prescribed check glucose using CGM, document, and provide at future appointments check blood pressure weekly, document, and provide at future appointments        The patient verbalized understanding of instructions, educational materials, and care plan provided today and declined offer to receive copy of patient instructions, educational materials, and care plan.    Plan: Patient declines CCM services at this time. Closing case. He has my contact information for future questions or concerns.   Catie Darnelle Maffucci, PharmD, [place], [place] Clinical Pharmacist [employer] at [place]

## 2020-11-20 ENCOUNTER — Telehealth: Payer: Self-pay | Admitting: *Deleted

## 2020-11-20 NOTE — Progress Notes (Signed)
Called and scheduled pt

## 2020-11-20 NOTE — Telephone Encounter (Signed)
   11/20/2020  Francisco Jackson 10/09/1976 [id number]    Outreach Attempt:  Outreach attempt #2 to patient for initial [facility] telephone assessment.  Patient answered and verified HIPAA.  RNCM introduced self and role.  Patient verbalizes interest and consent to CCM services but unable to complete assessment today.  Request call back another day and time.   Plan:  RNCM will make another telephone outreach to patient within the next 10 business days per patients request.  Hubert Azure RN, MSN RN Care Management Coordinator [place] [phone] [email or web address]

## 2020-11-26 ENCOUNTER — Telehealth: Payer: Self-pay | Admitting: *Deleted

## 2020-11-26 ENCOUNTER — Telehealth: Payer: 59

## 2020-11-26 NOTE — Telephone Encounter (Signed)
  Care Management   Outreach Note  11/26/2020 Name: Francisco Jackson MRN: [id number] DOB: 1976-07-20  Referred by: Einar Pheasant, MD Reason for referral : Chronic Care Management (DM II, HTN)   Successful contact was made with patient to initiate initial telephone assessment.  Patient stated he was unable to speak at this time due to being in a meeting.  Requested telephone call back another day and time.  Follow Up Plan: Telephone follow up appointment with care management team member scheduled for:  12/03/20 at 2 pm per patient request.  Hubert Azure RN, MSN RN Care Management Coordinator [place] [phone] [email or web address]

## 2020-12-02 ENCOUNTER — Encounter: Payer: Self-pay | Admitting: Internal Medicine

## 2020-12-02 ENCOUNTER — Ambulatory Visit (INDEPENDENT_AMBULATORY_CARE_PROVIDER_SITE_OTHER): Payer: 59 | Admitting: Internal Medicine

## 2020-12-02 ENCOUNTER — Other Ambulatory Visit: Payer: Self-pay

## 2020-12-02 VITALS — BP 124/72 | HR 85 | Temp 98.2°F | Resp 16 | Ht 71.0 in | Wt 247.0 lb

## 2020-12-02 DIAGNOSIS — R41840 Attention and concentration deficit: Secondary | ICD-10-CM

## 2020-12-02 DIAGNOSIS — F320 Major depressive disorder, single episode, mild: Secondary | ICD-10-CM

## 2020-12-02 DIAGNOSIS — I10 Essential (primary) hypertension: Secondary | ICD-10-CM

## 2020-12-02 DIAGNOSIS — Z23 Encounter for immunization: Secondary | ICD-10-CM | POA: Diagnosis not present

## 2020-12-02 DIAGNOSIS — E782 Mixed hyperlipidemia: Secondary | ICD-10-CM

## 2020-12-02 DIAGNOSIS — K760 Fatty (change of) liver, not elsewhere classified: Secondary | ICD-10-CM | POA: Diagnosis not present

## 2020-12-02 DIAGNOSIS — E349 Endocrine disorder, unspecified: Secondary | ICD-10-CM | POA: Diagnosis not present

## 2020-12-02 DIAGNOSIS — F32A Depression, unspecified: Secondary | ICD-10-CM

## 2020-12-02 DIAGNOSIS — F439 Reaction to severe stress, unspecified: Secondary | ICD-10-CM

## 2020-12-02 DIAGNOSIS — E7800 Pure hypercholesterolemia, unspecified: Secondary | ICD-10-CM | POA: Diagnosis not present

## 2020-12-02 DIAGNOSIS — E1165 Type 2 diabetes mellitus with hyperglycemia: Secondary | ICD-10-CM

## 2020-12-02 DIAGNOSIS — R69 Illness, unspecified: Secondary | ICD-10-CM | POA: Diagnosis not present

## 2020-12-02 LAB — BASIC METABOLIC PANEL
BUN: 18 mg/dL (ref 6–23)
CO2: 26 mEq/L (ref 19–32)
Calcium: 9.5 mg/dL (ref 8.4–10.5)
Chloride: 101 mEq/L (ref 96–112)
Creatinine, Ser: 1 mg/dL (ref 0.40–1.50)
GFR: 90.31 mL/min (ref >60.00)
Glucose, Bld: 125 mg/dL — ABNORMAL HIGH (ref 70–99)
Potassium: 4.1 mEq/L (ref 3.5–5.1)
Sodium: 137 mEq/L (ref 135–145)

## 2020-12-02 LAB — CBC WITH DIFFERENTIAL/PLATELET
Basophils Absolute: 0 10*3/uL (ref 0.0–0.1)
Basophils Relative: 0.6 % (ref 0.0–3.0)
Eosinophils Absolute: 0.4 10*3/uL (ref 0.0–0.7)
Eosinophils Relative: 5.3 % — ABNORMAL HIGH (ref 0.0–5.0)
HCT: 49.4 % (ref 39.0–52.0)
Hemoglobin: 17 g/dL (ref 13.0–17.0)
Lymphocytes Relative: 33.7 % (ref 12.0–46.0)
Lymphs Abs: 2.4 10*3/uL (ref 0.7–4.0)
MCHC: 34.4 g/dL (ref 30.0–36.0)
MCV: 86 fl (ref 78.0–100.0)
Monocytes Absolute: 0.6 10*3/uL (ref 0.1–1.0)
Monocytes Relative: 7.9 % (ref 3.0–12.0)
Neutro Abs: 3.8 10*3/uL (ref 1.4–7.7)
Neutrophils Relative %: 52.5 % (ref 43.0–77.0)
Platelets: 241 10*3/uL (ref 150.0–400.0)
RBC: 5.74 Mil/uL (ref 4.22–5.81)
RDW: 13.1 % (ref 11.5–15.5)
WBC: 7.2 10*3/uL (ref 4.0–10.5)

## 2020-12-02 LAB — LIPID PANEL
Cholesterol: 148 mg/dL (ref 0–200)
HDL: 36.2 mg/dL — ABNORMAL LOW (ref >39.00)
LDL Cholesterol: 77 mg/dL (ref 0–99)
NonHDL: 111.77
Total CHOL/HDL Ratio: 4
Triglycerides: 173 mg/dL — ABNORMAL HIGH (ref 0.0–149.0)
VLDL: 34.6 mg/dL (ref 0.0–40.0)

## 2020-12-02 LAB — HEPATIC FUNCTION PANEL
ALT: 39 U/L (ref 0–53)
AST: 21 U/L (ref 0–37)
Albumin: 4.5 g/dL (ref 3.5–5.2)
Alkaline Phosphatase: 51 U/L (ref 39–117)
Bilirubin, Direct: 0.1 mg/dL (ref 0.0–0.3)
Total Bilirubin: 0.5 mg/dL (ref 0.2–1.2)
Total Protein: 7.2 g/dL (ref 6.0–8.3)

## 2020-12-02 LAB — PSA: PSA: 0.25 ng/mL (ref 0.10–4.00)

## 2020-12-02 LAB — TESTOSTERONE: Testosterone: 296.48 ng/dL — ABNORMAL LOW (ref 300.00–890.00)

## 2020-12-02 LAB — HEMOGLOBIN A1C: Hgb A1c MFr Bld: 7.2 % — ABNORMAL HIGH (ref 4.6–6.5)

## 2020-12-02 MED ORDER — EMPAGLIFLOZIN 10 MG PO TABS: 10.0000 mg | ORAL_TABLET | Freq: Every day | ORAL | 2 refills | Status: DC

## 2020-12-02 MED ORDER — HYDROCHLOROTHIAZIDE 12.5 MG PO CAPS: 12.5000 mg | ORAL_CAPSULE | Freq: Every day | ORAL | 1 refills | Status: DC

## 2020-12-02 MED ORDER — SERTRALINE HCL 100 MG PO TABS: 100.0000 mg | ORAL_TABLET | Freq: Every day | ORAL | 1 refills | Status: DC

## 2020-12-02 MED ORDER — AMLODIPINE BESYLATE 10 MG PO TABS: 10.0000 mg | ORAL_TABLET | Freq: Every day | ORAL | 1 refills | Status: DC

## 2020-12-02 MED ORDER — ROSUVASTATIN CALCIUM 5 MG PO TABS: 5.0000 mg | ORAL_TABLET | Freq: Every day | ORAL | 3 refills | Status: DC

## 2020-12-02 NOTE — Progress Notes (Signed)
Patient ID: Francisco Jackson, male   DOB: 1976-04-09, 45 y.o.   MRN: [id number]   Subjective:    Patient ID: Francisco Jackson, male    DOB: 1976/09/02, 45 y.o.   MRN: [id number]  HPI This visit occurred during the SARS-CoV-2 public health emergency.  Safety protocols were in place, including screening questions prior to the visit, additional usage of staff PPE, and extensive cleaning of exam room while observing appropriate contact time as indicated for disinfecting solutions.  Patient here for a scheduled follow up.  Here to follow up regarding his blood sugar, blood pressure and cholesterol.  Stays active.  Not watching his diet as much.  No chest pain or sob reported.  No abdominal pain.  Bowels moving.  He will reschedule his eye exam.  Using androgel.  Wants testosterone level rechecked.  Feels is helping some.  Discussed ADHD testing.  He desires to be tested.  Trouble focusing and completing task.  Feels has been an issue throughout his life.  Feels affects his work.    Past Medical History:  Diagnosis Date  . Asthma   . Atypical mole 04/05/2013   Left forearm. Atypical combined nevus, moderate atypia.  . Diabetes mellitus without complication ([place])   . Dysplastic nevus 04/23/2014   Left prox. mid. tricep axilla. Mild atypia, margins free.  Marland Kitchen Hypertension   . Personality disorder ([place])    borderline   Past Surgical History:  Procedure Laterality Date  . ANTERIOR CRUCIATE LIGAMENT REPAIR Right 2006  . [place] SURGERY  2010  . COLONOSCOPY WITH PROPOFOL N/A 11/01/2019   Procedure: COLONOSCOPY WITH PROPOFOL;  Surgeon: Jonathon Bellows, MD;  Location: [facility] ENDOSCOPY;  Service: Gastroenterology;  Laterality: N/A;  . MENISCUS REPAIR Left 1996   Family History  Problem Relation Age of Onset  . Diabetes Mother   . Melanoma Mother   . Diabetes Paternal Aunt   . Heart attack Paternal Aunt   . Diabetes Paternal Uncle   . Diabetes Maternal Grandmother   . Diabetes Maternal Grandfather    . Heart disease Maternal Grandfather   . Heart attack Maternal Grandfather   . Heart attack Father   . Hypertension Father   . Diabetes Sister   . Hypertension Sister   . Kidney cancer Neg Hx   . Kidney disease Neg Hx   . Prostate cancer Neg Hx    Social History   Socioeconomic History  . Marital status: Married    Spouse name: Nevin Bloodgood  . Number of children: Not on file  . Years of education: Not on file  . Highest education level: Not on file  Occupational History  . Occupation: Development worker, community    Comment: owner of [employer]   Tobacco Use  . Smoking status: Former Smoker    Packs/day: 0.50    Years: 10.00    Pack years: 5.00    Types: Cigarettes    Quit date: 10/11/2002    Years since quitting: 18.1  . Smokeless tobacco: Former Systems developer    Types: Snuff    Quit date: 10/11/2009  Substance and Sexual Activity  . Alcohol use: Yes    Alcohol/week: 4.0 standard drinks    Types: 4 Glasses of wine per week    Comment: ocassional  . Drug use: No  . Sexual activity: Not on file  Other Topics Concern  . Not on file  Social History Narrative  . Not on file   Social Determinants of Health   Financial Resource  Strain: Low Risk   . Difficulty of Paying Living Expenses: Not hard at all  Food Insecurity: No Food Insecurity  . Worried About Charity fundraiser in the Last Year: Never true  . Ran Out of Food in the Last Year: Never true  Transportation Needs: No Transportation Needs  . Lack of Transportation (Medical): No  . Lack of Transportation (Non-Medical): No  Physical Activity: Not on file  Stress: Not on file  Social Connections: Not on file    Outpatient Encounter Medications as of 12/02/2020  Medication Sig  . amLODipine (NORVASC) 10 MG tablet Take 1 tablet (10 mg total) by mouth daily.  Marland Kitchen azelastine (ASTELIN) 0.1 % nasal spray as needed.  Marland Kitchen azelastine (OPTIVAR) 0.05 % ophthalmic solution Apply 1 drop to eye as needed.  . cholecalciferol (VITAMIN D3) 25 MCG (1000 UNIT)  tablet Take 1,000 Units by mouth daily.  . Continuous Blood Gluc Sensor (FREESTYLE LIBRE 2 SENSOR) MISC Use to check glucose at least TID  . empagliflozin (JARDIANCE) 10 MG TABS tablet Take 1 tablet (10 mg total) by mouth daily before breakfast.  . fluticasone (FLONASE) 50 MCG/ACT nasal spray USE 2 SPRAYS IN EACH NOSTRIL DAILY  . hydrochlorothiazide (MICROZIDE) 12.5 MG capsule Take 1 capsule (12.5 mg total) by mouth daily.  . Insulin Pen Needle (PEN NEEDLES) 30G X 5 MM MISC Inject 1 Applicatorful into the skin once a week. Use with trulicity pen  . lisinopril (ZESTRIL) 5 MG tablet TAKE 1 TABLET BY MOUTH DAILY  . metFORMIN (GLUCOPHAGE-XR) 500 MG 24 hr tablet TAKE 2 TABLETS BY MOUTH 2 TIMES A DAY  . montelukast (SINGULAIR) 10 MG tablet TAKE ONE TABLET EVERY MORNING  . Multiple Vitamin (MULTIVITAMIN) tablet Take 1 tablet by mouth daily.  . rosuvastatin (CRESTOR) 5 MG tablet Take 1 tablet (5 mg total) by mouth daily.  . sertraline (ZOLOFT) 100 MG tablet Take 1 tablet (100 mg total) by mouth daily.  . sildenafil (REVATIO) 20 MG tablet Take 2-4 tablets two hours before intercouse on an empty stomach.  Do not take with nitrates.  . TRULICITY 1.5 XN/2.3FT SOPN INJECT 1 PEN ( 1.5 MG ) SUBCUTANEOUSLY ONCE EVERY 7 DAYS. ** NEED APPT**.  . [DISCONTINUED] albuterol (PROVENTIL HFA;VENTOLIN HFA) 108 (90 BASE) MCG/ACT inhaler Inhale 1-2 puffs into the lungs every 6 (six) hours as needed. (Patient not taking: No sig reported)  . [DISCONTINUED] amLODipine (NORVASC) 10 MG tablet TAKE 1 TABLET BY MOUTH DAILY  . [DISCONTINUED] FLOVENT HFA 110 MCG/ACT inhaler INHALE 2 PUFFS INTO THE LUNGS TWICE DAILY (Patient not taking: No sig reported)  . [DISCONTINUED] hydrochlorothiazide (MICROZIDE) 12.5 MG capsule TAKE 1 CAPSULE BY MOUTH ONCE DAILY  . [DISCONTINUED] JARDIANCE 10 MG TABS tablet TAKE 1 TABLET BY MOUTH DAILY BEFORE BREAKFAST  . [DISCONTINUED] rosuvastatin (CRESTOR) 5 MG tablet Take 1 tablet (5 mg total) by mouth  daily.  . [DISCONTINUED] sertraline (ZOLOFT) 100 MG tablet TAKE 1 TABLET BY MOUTH DAILY  . [DISCONTINUED] Testosterone 30 MG/ACT SOLN PLACE 2 PUMPS ONTO THE SKIN OF AXILLA EVERY MORNING   No facility-administered encounter medications on file as of 12/02/2020.    Review of Systems  Constitutional: Negative for appetite change and unexpected weight change.  HENT: Negative for congestion and sinus pressure.   Respiratory: Negative for cough, chest tightness and shortness of breath.   Cardiovascular: Negative for chest pain, palpitations and leg swelling.  Gastrointestinal: Negative for abdominal pain, diarrhea, nausea and vomiting.  Genitourinary: Negative for difficulty urinating and  dysuria.  Musculoskeletal: Negative for joint swelling and myalgias.  Skin: Negative for color change and rash.  Neurological: Negative for dizziness, light-headedness and headaches.  Psychiatric/Behavioral: Negative for dysphoric mood.       Agitation is better.         Objective:    Physical Exam Vitals reviewed.  Constitutional:      General: He is not in acute distress.    Appearance: Normal appearance. He is well-developed and well-nourished.  HENT:     Head: Normocephalic and atraumatic.     Right Ear: External ear normal.     Left Ear: External ear normal.  Eyes:     General: No scleral icterus.       Right eye: No discharge.        Left eye: No discharge.     Conjunctiva/sclera: Conjunctivae normal.  Cardiovascular:     Rate and Rhythm: Normal rate and regular rhythm.  Pulmonary:     Effort: Pulmonary effort is normal. No respiratory distress.     Breath sounds: Normal breath sounds.  Abdominal:     General: Bowel sounds are normal.     Palpations: Abdomen is soft.     Tenderness: There is no abdominal tenderness.  Musculoskeletal:        General: No swelling, tenderness or edema.     Cervical back: Neck supple. No tenderness.  Lymphadenopathy:     Cervical: No cervical  adenopathy.  Skin:    Findings: No erythema or rash.  Neurological:     Mental Status: He is alert.  Psychiatric:        Mood and Affect: Mood and affect and mood normal.        Behavior: Behavior normal.     BP 124/72   Pulse 85   Temp 98.2 F (36.8 C) (Oral)   Resp 16   Ht 5' 11" (1.803 m)   Wt 247 lb (112 kg)   SpO2 97%   BMI 34.45 kg/m  Wt Readings from Last 3 Encounters:  12/02/20 247 lb (112 kg)  08/04/20 239 lb (108.4 kg)  05/06/20 (!) 236 lb 9.6 oz (107.3 kg)     Lab Results  Component Value Date   WBC 7.2 12/02/2020   HGB 17.0 12/02/2020   HCT 49.4 12/02/2020   PLT 241.0 12/02/2020   GLUCOSE 125 (H) 12/02/2020   CHOL 148 12/02/2020   TRIG 173.0 (H) 12/02/2020   HDL 36.20 (L) 12/02/2020   LDLCALC 77 12/02/2020   ALT 39 12/02/2020   AST 21 12/02/2020   NA 137 12/02/2020   K 4.1 12/02/2020   CL 101 12/02/2020   CREATININE 1.00 12/02/2020   BUN 18 12/02/2020   CO2 26 12/02/2020   TSH 1.64 04/03/2020   PSA 0.25 12/02/2020   HGBA1C 7.2 (H) 12/02/2020   MICROALBUR <0.7 12/26/2019    MR 3D Recon At Scanner  Result Date: 07/20/2020 CLINICAL DATA:  Epigastric pain for 1 month. History of pancreatitis. History of ethanol abuse. EXAM: MRI ABDOMEN WITHOUT AND WITH CONTRAST (INCLUDING MRCP) TECHNIQUE: Multiplanar multisequence MR imaging of the abdomen was performed both before and after the administration of intravenous contrast. Heavily T2-weighted images of the biliary and pancreatic ducts were obtained, and three-dimensional MRCP images were rendered by post processing. CONTRAST:  88m GADAVIST GADOBUTROL 1 MMOL/ML IV SOLN field COMPARISON:  Abdominal ultrasound 04/30/2020.  CT 05/02/2020. FINDINGS: Lower chest: Normal heart size without pericardial or pleural effusion. Hepatobiliary: Mild hepatomegaly and marked hepatic  steatosis. 18.9 cm craniocaudal. No focal liver lesion. Normal gallbladder. No intra or extrahepatic biliary duct dilatation. No  choledocholithiasis. Pancreas: No evidence of acute pancreatitis. No pancreatic duct dilatation. No pancreas divisum. Pancreas enhances normally. Spleen:  Normal in size, without focal abnormality. Adrenals/Urinary Tract: Normal adrenal glands. Normal kidneys, without hydronephrosis. Stomach/Bowel: Tiny hiatal hernia.  Normal abdominal bowel loops. Vascular/Lymphatic: Aortic atherosclerosis. No abdominal adenopathy. Other:  No ascites. Musculoskeletal: No acute osseous abnormality. IMPRESSION: 1. No evidence of acute pancreatitis or other acute abdominal process. No biliary duct dilatation or choledocholithiasis. 2. Hepatic steatosis and mild hepatomegaly. 3. Tiny hiatal hernia. Electronically Signed   By: Abigail Miyamoto M.D.   On: 07/20/2020 11:27   MR ABDOMEN MRCP W WO CONTAST  Result Date: 07/20/2020 CLINICAL DATA:  Epigastric pain for 1 month. History of pancreatitis. History of ethanol abuse. EXAM: MRI ABDOMEN WITHOUT AND WITH CONTRAST (INCLUDING MRCP) TECHNIQUE: Multiplanar multisequence MR imaging of the abdomen was performed both before and after the administration of intravenous contrast. Heavily T2-weighted images of the biliary and pancreatic ducts were obtained, and three-dimensional MRCP images were rendered by post processing. CONTRAST:  8m GADAVIST GADOBUTROL 1 MMOL/ML IV SOLN field COMPARISON:  Abdominal ultrasound 04/30/2020.  CT 05/02/2020. FINDINGS: Lower chest: Normal heart size without pericardial or pleural effusion. Hepatobiliary: Mild hepatomegaly and marked hepatic steatosis. 18.9 cm craniocaudal. No focal liver lesion. Normal gallbladder. No intra or extrahepatic biliary duct dilatation. No choledocholithiasis. Pancreas: No evidence of acute pancreatitis. No pancreatic duct dilatation. No pancreas divisum. Pancreas enhances normally. Spleen:  Normal in size, without focal abnormality. Adrenals/Urinary Tract: Normal adrenal glands. Normal kidneys, without hydronephrosis. Stomach/Bowel:  Tiny hiatal hernia.  Normal abdominal bowel loops. Vascular/Lymphatic: Aortic atherosclerosis. No abdominal adenopathy. Other:  No ascites. Musculoskeletal: No acute osseous abnormality. IMPRESSION: 1. No evidence of acute pancreatitis or other acute abdominal process. No biliary duct dilatation or choledocholithiasis. 2. Hepatic steatosis and mild hepatomegaly. 3. Tiny hiatal hernia. Electronically Signed   By: KAbigail MiyamotoM.D.   On: 07/20/2020 11:27       Assessment & Plan:   Problem List Items Addressed This Visit    Diabetes mellitus ([place]    On metformin, jardiance and trulicity.  Discussed low carb diet and exercise.  Due labs.  Check met b and a1c today.       Relevant Medications   empagliflozin (JARDIANCE) 10 MG TABS tablet   rosuvastatin (CRESTOR) 5 MG tablet   Disturbed concentration    Has trouble focusing and concentrating.  Refer for ADHD testing per pt request.       Relevant Orders   Ambulatory referral to Psychology   Hepatic steatosis    Found on abdominal ultrasound.  Diet, exercise. and weight loss.  Follow liver function tests.        Hypertension, essential    Continue hctz and amlodipine.  Blood pressure doing well.  Follow pressures.  Follow metabolic panel.       Relevant Medications   amLODipine (NORVASC) 10 MG tablet   hydrochlorothiazide (MICROZIDE) 12.5 MG capsule   rosuvastatin (CRESTOR) 5 MG tablet   Mild depression (HCC)    On zoloft.  Denies any depression now.  Follow.       Relevant Medications   sertraline (ZOLOFT) 100 MG tablet   Mixed hyperlipidemia    On crestor.  Low cholesterol diet and exercise.  Follow lipid panel and liver function tests.        Relevant Medications   amLODipine (NORVASC)  10 MG tablet   hydrochlorothiazide (MICROZIDE) 12.5 MG capsule   rosuvastatin (CRESTOR) 5 MG tablet   Stress    Overall doing better.  On zoloft.  Follow.        Testosterone deficiency - Primary    Receiving testosterone replacement.   Check testosterone level and psa today.        Relevant Orders   Testosterone (Completed)    Other Visit Diagnoses    Hypercholesterolemia       Relevant Medications   amLODipine (NORVASC) 10 MG tablet   hydrochlorothiazide (MICROZIDE) 12.5 MG capsule   rosuvastatin (CRESTOR) 5 MG tablet   Need for immunization against influenza       Relevant Orders   Flu Vaccine QUAD 36+ mos IM (Completed)       Einar Pheasant, MD

## 2020-12-02 NOTE — Assessment & Plan Note (Signed)
On metformin, jardiance and trulicity.  Discussed low carb diet and exercise.  Due labs.  Check met b and a1c today.

## 2020-12-03 ENCOUNTER — Other Ambulatory Visit: Payer: Self-pay | Admitting: Internal Medicine

## 2020-12-03 ENCOUNTER — Ambulatory Visit: Payer: 59 | Admitting: *Deleted

## 2020-12-03 DIAGNOSIS — I10 Essential (primary) hypertension: Secondary | ICD-10-CM

## 2020-12-03 DIAGNOSIS — E119 Type 2 diabetes mellitus without complications: Secondary | ICD-10-CM

## 2020-12-03 NOTE — Chronic Care Management (AMB) (Signed)
   12/03/2020  GARETH FITZNER 11/10/1975 [id number]   Outreach Attempt:  Successful telephone outreach to patient.  Patient states he on the way to the office to work and unable to speak at this time.  States he is going out of town for the next week and request call back once he returns.  Plan:  RNCM will make another outreach attempt to patient on 12/19/20 per patient's request.  Hubert Azure RN, MSN RN Care Management Coordinator [facility] [phone] [email or web address]

## 2020-12-04 NOTE — Telephone Encounter (Signed)
rx sent in for testosterone gel.Francisco Jackson

## 2020-12-07 ENCOUNTER — Encounter: Payer: Self-pay | Admitting: Internal Medicine

## 2020-12-07 NOTE — Assessment & Plan Note (Signed)
On crestor.  Low cholesterol diet and exercise.  Follow lipid panel and liver function tests.   

## 2020-12-07 NOTE — Assessment & Plan Note (Signed)
Found on abdominal ultrasound.  Diet, exercise. and weight loss.  Follow liver function tests.     

## 2020-12-07 NOTE — Assessment & Plan Note (Signed)
Overall doing better.  On zoloft.  Follow.

## 2020-12-07 NOTE — Assessment & Plan Note (Signed)
On zoloft.  Denies any depression now.  Follow.

## 2020-12-07 NOTE — Assessment & Plan Note (Signed)
Continue hctz and amlodipine.  Blood pressure doing well.  Follow pressures.  Follow metabolic panel.  

## 2020-12-07 NOTE — Assessment & Plan Note (Signed)
Has trouble focusing and concentrating.  Refer for ADHD testing per pt request.

## 2020-12-07 NOTE — Assessment & Plan Note (Signed)
Receiving testosterone replacement.  Check testosterone level and psa today.

## 2020-12-19 ENCOUNTER — Telehealth: Payer: Self-pay | Admitting: *Deleted

## 2020-12-19 ENCOUNTER — Telehealth: Payer: 59

## 2020-12-19 NOTE — Telephone Encounter (Signed)
  Care Management   Outreach Note  12/19/2020 Name: Francisco Jackson MRN: [id number] DOB: [birth year]  Referred by: Einar Pheasant, MD Reason for referral : Chronic Care Management (DM, HTN)   An unsuccessful telephone outreach was attempted today. The patient was referred to the case management team for assistance with care management and care coordination.   Follow Up Plan: RNCM will request assistance from Care Guide to reschedule patient within the next 20 business days.  Hubert Azure RN, MSN RN Care Management Coordinator [facility] [phone] [email or web address]

## 2020-12-30 ENCOUNTER — Telehealth: Payer: Self-pay

## 2020-12-30 NOTE — Chronic Care Management (AMB) (Signed)
  Care Management   Note  12/30/2020 Name: Francisco Jackson MRN: [id number] DOB: Jun 07, 1976  Francisco Jackson is a 45 y.o. year old male who is a primary care patient of Einar Pheasant, MD and is actively engaged with the care management team. I reached out to Celene Squibb by phone today to assist with re-scheduling an initial visit with the RN Case Manager  Follow up plan: Unsuccessful telephone outreach attempt made. A HIPAA compliant phone message was left for the patient providing contact information and requesting a return call.  The care management team will reach out to the patient again over the next 5 days.  If patient returns call to provider office, please advise to call [facility]  at [place], [place], [place], [place] [address] Direct Dial: [phone] [email or web address]

## 2021-01-02 ENCOUNTER — Ambulatory Visit: Payer: 59 | Admitting: *Deleted

## 2021-01-04 ENCOUNTER — Other Ambulatory Visit: Payer: Self-pay | Admitting: Internal Medicine

## 2021-01-15 DIAGNOSIS — J3081 Allergic rhinitis due to animal (cat) (dog) hair and dander: Secondary | ICD-10-CM | POA: Diagnosis not present

## 2021-01-15 DIAGNOSIS — H1045 Other chronic allergic conjunctivitis: Secondary | ICD-10-CM | POA: Diagnosis not present

## 2021-01-15 DIAGNOSIS — J4530 Mild persistent asthma, uncomplicated: Secondary | ICD-10-CM | POA: Diagnosis not present

## 2021-01-15 DIAGNOSIS — J3089 Other allergic rhinitis: Secondary | ICD-10-CM | POA: Diagnosis not present

## 2021-01-20 ENCOUNTER — Telehealth: Payer: Self-pay | Admitting: *Deleted

## 2021-01-20 NOTE — Telephone Encounter (Signed)
Please reschedule with RN CM at [facility]

## 2021-01-20 NOTE — Chronic Care Management (AMB) (Signed)
  Care Management   Note  01/20/2021 Name: [name] MRN: [id number] DOB: [birth year]  Francisco Jackson is a 45 y.o. year old male who is a primary care patient of Einar Pheasant, MD and is actively engaged with the care management team. I reached out to Celene Squibb by phone today to assist with re-scheduling an initial visit with the RN Case Manager  Follow up plan: Unsuccessful telephone outreach attempt made. A HIPAA compliant phone message was left for the patient providing contact information and requesting a return call.  The care management team will reach out to the patient again over the next 5 days.  If patient returns call to provider office, please advise to call [place] Lysle Morales at [place] [facility]

## 2021-01-27 NOTE — Chronic Care Management (AMB) (Signed)
  Care Management   Note  01/27/2021 Name: Francisco Jackson MRN: [id number] DOB: Dec 30, 1975  Francisco Jackson is a 45 y.o. year old male who is a primary care patient of Einar Pheasant, MD and is actively engaged with the care management team. I reached out to Celene Squibb by phone today to assist with re-scheduling an initial visit with the RN Case Manager  Follow up plan: Telephone appointment with care management team member scheduled for:  05/11/2021 [place] Management

## 2021-01-27 NOTE — Chronic Care Management (AMB) (Signed)
  Care Management   Note  01/27/2021 Name: KOHEN REITHER MRN: [id number] DOB: 02-17-1976  KAMAAL CAST is a 45 y.o. year old male who is a primary care patient of Einar Pheasant, MD and is actively engaged with the care management team. I reached out to Celene Squibb by phone today to assist with re-scheduling an initial visit with the RN Case Manager  Follow up plan: Telephone appointment with care management team member scheduled for:   05/11/2021  [place] Management

## 2021-01-28 DIAGNOSIS — H524 Presbyopia: Secondary | ICD-10-CM | POA: Diagnosis not present

## 2021-01-28 LAB — HM DIABETES EYE EXAM

## 2021-01-30 ENCOUNTER — Other Ambulatory Visit: Payer: Self-pay | Admitting: Internal Medicine

## 2021-02-02 NOTE — Telephone Encounter (Signed)
RX Refill:testosterone Last Seen:12-02-20 Last ordered:12-04-20

## 2021-02-03 NOTE — Telephone Encounter (Signed)
rx ok'd for testosterone

## 2021-02-18 ENCOUNTER — Ambulatory Visit (INDEPENDENT_AMBULATORY_CARE_PROVIDER_SITE_OTHER): Payer: 59 | Admitting: Psychology

## 2021-02-18 DIAGNOSIS — R69 Illness, unspecified: Secondary | ICD-10-CM | POA: Diagnosis not present

## 2021-02-18 DIAGNOSIS — F4322 Adjustment disorder with anxiety: Secondary | ICD-10-CM | POA: Diagnosis not present

## 2021-03-05 ENCOUNTER — Other Ambulatory Visit: Payer: Self-pay | Admitting: Internal Medicine

## 2021-03-06 DIAGNOSIS — Z20822 Contact with and (suspected) exposure to covid-19: Secondary | ICD-10-CM | POA: Diagnosis not present

## 2021-03-17 ENCOUNTER — Ambulatory Visit: Payer: 59 | Admitting: Psychology

## 2021-03-18 DIAGNOSIS — R69 Illness, unspecified: Secondary | ICD-10-CM | POA: Diagnosis not present

## 2021-03-18 DIAGNOSIS — F902 Attention-deficit hyperactivity disorder, combined type: Secondary | ICD-10-CM | POA: Diagnosis not present

## 2021-03-18 DIAGNOSIS — F419 Anxiety disorder, unspecified: Secondary | ICD-10-CM | POA: Diagnosis not present

## 2021-03-19 DIAGNOSIS — G4733 Obstructive sleep apnea (adult) (pediatric): Secondary | ICD-10-CM | POA: Diagnosis not present

## 2021-04-01 ENCOUNTER — Other Ambulatory Visit: Payer: Self-pay

## 2021-04-01 ENCOUNTER — Encounter: Payer: Self-pay | Admitting: Internal Medicine

## 2021-04-01 ENCOUNTER — Ambulatory Visit (INDEPENDENT_AMBULATORY_CARE_PROVIDER_SITE_OTHER): Payer: 59 | Admitting: Internal Medicine

## 2021-04-01 VITALS — BP 114/70 | HR 70 | Temp 97.6°F | Resp 16 | Ht 71.0 in | Wt 236.8 lb

## 2021-04-01 DIAGNOSIS — F439 Reaction to severe stress, unspecified: Secondary | ICD-10-CM

## 2021-04-01 DIAGNOSIS — E1165 Type 2 diabetes mellitus with hyperglycemia: Secondary | ICD-10-CM | POA: Diagnosis not present

## 2021-04-01 DIAGNOSIS — I10 Essential (primary) hypertension: Secondary | ICD-10-CM

## 2021-04-01 DIAGNOSIS — E782 Mixed hyperlipidemia: Secondary | ICD-10-CM | POA: Diagnosis not present

## 2021-04-01 DIAGNOSIS — F1029 Alcohol dependence with unspecified alcohol-induced disorder: Secondary | ICD-10-CM

## 2021-04-01 DIAGNOSIS — E119 Type 2 diabetes mellitus without complications: Secondary | ICD-10-CM

## 2021-04-01 DIAGNOSIS — K760 Fatty (change of) liver, not elsewhere classified: Secondary | ICD-10-CM | POA: Diagnosis not present

## 2021-04-01 DIAGNOSIS — E7800 Pure hypercholesterolemia, unspecified: Secondary | ICD-10-CM

## 2021-04-01 DIAGNOSIS — F32A Depression, unspecified: Secondary | ICD-10-CM

## 2021-04-01 DIAGNOSIS — E349 Endocrine disorder, unspecified: Secondary | ICD-10-CM | POA: Diagnosis not present

## 2021-04-01 DIAGNOSIS — R7989 Other specified abnormal findings of blood chemistry: Secondary | ICD-10-CM

## 2021-04-01 DIAGNOSIS — R945 Abnormal results of liver function studies: Secondary | ICD-10-CM

## 2021-04-01 DIAGNOSIS — F320 Major depressive disorder, single episode, mild: Secondary | ICD-10-CM

## 2021-04-01 DIAGNOSIS — F909 Attention-deficit hyperactivity disorder, unspecified type: Secondary | ICD-10-CM

## 2021-04-01 DIAGNOSIS — R69 Illness, unspecified: Secondary | ICD-10-CM | POA: Diagnosis not present

## 2021-04-01 LAB — HEPATIC FUNCTION PANEL
ALT: 49 U/L (ref 0–53)
AST: 20 U/L (ref 0–37)
Albumin: 4.5 g/dL (ref 3.5–5.2)
Alkaline Phosphatase: 56 U/L (ref 39–117)
Bilirubin, Direct: 0.1 mg/dL (ref 0.0–0.3)
Total Bilirubin: 0.5 mg/dL (ref 0.2–1.2)
Total Protein: 7 g/dL (ref 6.0–8.3)

## 2021-04-01 LAB — LIPID PANEL
Cholesterol: 108 mg/dL (ref 0–200)
HDL: 33.9 mg/dL — ABNORMAL LOW (ref >39.00)
LDL Cholesterol: 57 mg/dL (ref 0–99)
NonHDL: 74.44
Total CHOL/HDL Ratio: 3
Triglycerides: 89 mg/dL (ref 0.0–149.0)
VLDL: 17.8 mg/dL (ref 0.0–40.0)

## 2021-04-01 LAB — CBC WITH DIFFERENTIAL/PLATELET
Basophils Absolute: 0 10*3/uL (ref 0.0–0.1)
Basophils Relative: 0.7 % (ref 0.0–3.0)
Eosinophils Absolute: 0.4 10*3/uL (ref 0.0–0.7)
Eosinophils Relative: 6.2 % — ABNORMAL HIGH (ref 0.0–5.0)
HCT: 50.5 % (ref 39.0–52.0)
Hemoglobin: 17.2 g/dL — ABNORMAL HIGH (ref 13.0–17.0)
Lymphocytes Relative: 27.5 % (ref 12.0–46.0)
Lymphs Abs: 2 10*3/uL (ref 0.7–4.0)
MCHC: 34 g/dL (ref 30.0–36.0)
MCV: 87.7 fl (ref 78.0–100.0)
Monocytes Absolute: 0.6 10*3/uL (ref 0.1–1.0)
Monocytes Relative: 8.3 % (ref 3.0–12.0)
Neutro Abs: 4.1 10*3/uL (ref 1.4–7.7)
Neutrophils Relative %: 57.3 % (ref 43.0–77.0)
Platelets: 243 10*3/uL (ref 150.0–400.0)
RBC: 5.75 Mil/uL (ref 4.22–5.81)
RDW: 13.9 % (ref 11.5–15.5)
WBC: 7.2 10*3/uL (ref 4.0–10.5)

## 2021-04-01 LAB — BASIC METABOLIC PANEL
BUN: 18 mg/dL (ref 6–23)
CO2: 26 mEq/L (ref 19–32)
Calcium: 9.5 mg/dL (ref 8.4–10.5)
Chloride: 101 mEq/L (ref 96–112)
Creatinine, Ser: 1.04 mg/dL (ref 0.40–1.50)
GFR: 85.96 mL/min (ref >60.00)
Glucose, Bld: 151 mg/dL — ABNORMAL HIGH (ref 70–99)
Potassium: 4.4 mEq/L (ref 3.5–5.1)
Sodium: 136 mEq/L (ref 135–145)

## 2021-04-01 LAB — HEMOGLOBIN A1C: Hgb A1c MFr Bld: 7.7 % — ABNORMAL HIGH (ref 4.6–6.5)

## 2021-04-01 LAB — TSH: TSH: 1.85 u[IU]/mL (ref 0.35–4.50)

## 2021-04-01 LAB — HM DIABETES FOOT EXAM

## 2021-04-01 NOTE — Progress Notes (Signed)
Patient ID: Francisco Jackson, male   DOB: 1976/04/21, 45 y.o.   MRN: [id number]   Subjective:    Patient ID: Francisco Jackson, male    DOB: Aug 31, 1976, 45 y.o.   MRN: [id number]  HPI This visit occurred during the SARS-CoV-2 public health emergency.  Safety protocols were in place, including screening questions prior to the visit, additional usage of staff PPE, and extensive cleaning of exam room while observing appropriate contact time as indicated for disinfecting solutions.   Patient here for a scheduled follow up.  Here to follow up regarding his blood sugar, cholesterol and blood pressure.  He reports he is doing relatively well.  Working.  Feels he is handling stress - better.  Off zoloft.  Does not feel he needs the medication.  Feels better off.  Trying to watch his diet.  Has lost weight.  Feels better.  Denies chest pain or sob.  No acid reflux or abdominal pain reported.  Sugars doing better.  Does not feel testosterone is making a difference in his symptoms.  Discussed recheck testosterone level.  Recently evaluated for ADHD.  Based on developmental history, observations and test data he appeared to meet the criteria for ADHD with anxiety.  He was interested in starting medication. Discussed possible side effects, etc.    Past Medical History:  Diagnosis Date   Asthma    Atypical mole 04/05/2013   Left forearm. Atypical combined nevus, moderate atypia.   Diabetes mellitus without complication ([place])    Dysplastic nevus 04/23/2014   Left prox. mid. tricep axilla. Mild atypia, margins free.   Hypertension    Personality disorder ([place])    borderline   Past Surgical History:  Procedure Laterality Date   ANTERIOR CRUCIATE LIGAMENT REPAIR Right 2006   CERVICAL [place] SURGERY  2010   COLONOSCOPY WITH PROPOFOL N/A 11/01/2019   Procedure: COLONOSCOPY WITH PROPOFOL;  Surgeon: Jonathon Bellows, MD;  Location: [facility];  Service: Gastroenterology;  Laterality: N/A;   MENISCUS REPAIR Left  1996   Family History  Problem Relation Age of Onset   Diabetes Mother    Melanoma Mother    Diabetes Paternal Aunt    Heart attack Paternal Aunt    Diabetes Paternal Uncle    Diabetes Maternal Grandmother    Diabetes Maternal Grandfather    Heart disease Maternal Grandfather    Heart attack Maternal Grandfather    Heart attack Father    Hypertension Father    Diabetes Sister    Hypertension Sister    Kidney cancer Neg Hx    Kidney disease Neg Hx    Prostate cancer Neg Hx    Social History   Socioeconomic History   Marital status: Married    Spouse name: Nevin Bloodgood   Number of children: Not on file   Years of education: Not on file   Highest education level: Not on file  Occupational History   Occupation: plumber    Comment: owner of plumbing company   Tobacco Use   Smoking status: Former    Packs/day: 0.50    Years: 10.00    Pack years: 5.00    Types: Cigarettes    Quit date: 10/11/2002    Years since quitting: 18.5   Smokeless tobacco: Former    Types: Snuff    Quit date: 10/11/2009  Substance and Sexual Activity   Alcohol use: Yes    Alcohol/week: 4.0 standard drinks    Types: 4 Glasses of wine per week  Comment: ocassional   Drug use: No   Sexual activity: Not on file  Other Topics Concern   Not on file  Social History Narrative   Not on file   Social Determinants of Health   Financial Resource Strain: Low Risk    Difficulty of Paying Living Expenses: Not hard at all  Food Insecurity: No Food Insecurity   Worried About Charity fundraiser in the Last Year: Never true   [place] in the Last Year: Never true  Transportation Needs: No Transportation Needs   Lack of Transportation (Medical): No   Lack of Transportation (Non-Medical): No  Physical Activity: Not on file  Stress: Not on file  Social Connections: Not on file    Outpatient Encounter Medications as of 04/01/2021  Medication Sig   amLODipine (NORVASC) 10 MG tablet Take 1 tablet (10 mg  total) by mouth daily.   azelastine (ASTELIN) 0.1 % nasal spray as needed.   azelastine (OPTIVAR) 0.05 % ophthalmic solution Apply 1 drop to eye as needed.   cholecalciferol (VITAMIN D3) 25 MCG (1000 UNIT) tablet Take 1,000 Units by mouth daily.   Continuous Blood Gluc Sensor (FREESTYLE LIBRE 2 SENSOR) MISC Use to check glucose at least TID   fluticasone (FLONASE) 50 MCG/ACT nasal spray USE 2 SPRAYS IN EACH NOSTRIL DAILY   hydrochlorothiazide (MICROZIDE) 12.5 MG capsule Take 1 capsule (12.5 mg total) by mouth daily.   Insulin Pen Needle (PEN NEEDLES) 30G X 5 MM MISC Inject 1 Applicatorful into the skin once a week. Use with trulicity pen   JARDIANCE 10 MG TABS tablet TAKE 1 TABLET BY MOUTH ONCE DAILY BEFOREBREAKFAST   lisinopril (ZESTRIL) 5 MG tablet TAKE 1 TABLET BY MOUTH DAILY   metFORMIN (GLUCOPHAGE-XR) 500 MG 24 hr tablet TAKE 2 TABLETS BY MOUTH 2 TIMES A DAY   montelukast (SINGULAIR) 10 MG tablet TAKE ONE TABLET EVERY MORNING   Multiple Vitamin (MULTIVITAMIN) tablet Take 1 tablet by mouth daily.   rosuvastatin (CRESTOR) 5 MG tablet Take 1 tablet (5 mg total) by mouth daily.   sildenafil (REVATIO) 20 MG tablet Take 2-4 tablets two hours before intercouse on an empty stomach.  Do not take with nitrates.   Testosterone 30 MG/ACT SOLN PLACE 3 PUMPS ONTO THE SKIN OF AXILLA EVERY MORNING   TRULICITY 1.5 TG/6.2IR SOPN INJECT 1 PEN ( 1.5 MG ) SUBCUTANEOUSLY ONCE EVERY 7 DAYS.   [DISCONTINUED] sertraline (ZOLOFT) 100 MG tablet Take 1 tablet (100 mg total) by mouth daily. (Patient not taking: Reported on 04/01/2021)   No facility-administered encounter medications on file as of 04/01/2021.     Review of Systems  Constitutional:  Negative for appetite change and unexpected weight change.  HENT:  Negative for congestion and sinus pressure.   Respiratory:  Negative for cough, chest tightness and shortness of breath.   Cardiovascular:  Negative for chest pain, palpitations and leg swelling.   Gastrointestinal:  Negative for abdominal pain, diarrhea, nausea and vomiting.  Genitourinary:  Negative for difficulty urinating and dysuria.  Musculoskeletal:  Negative for joint swelling and myalgias.  Skin:  Negative for color change and rash.  Neurological:  Negative for dizziness, light-headedness and headaches.  Psychiatric/Behavioral:  Negative for dysphoric mood.        No increased agitation currently.  Feels better.        Objective:    Physical Exam Constitutional:      General: He is not in acute distress.    Appearance: Normal  appearance. He is well-developed.  HENT:     Head: Normocephalic and atraumatic.     Right Ear: External ear normal.     Left Ear: External ear normal.  Eyes:     General: No scleral icterus.       Right eye: No discharge.        Left eye: No discharge.  Cardiovascular:     Rate and Rhythm: Normal rate and regular rhythm.  Pulmonary:     Effort: Pulmonary effort is normal. No respiratory distress.     Breath sounds: Normal breath sounds.  Abdominal:     General: Bowel sounds are normal.     Palpations: Abdomen is soft.     Tenderness: There is no abdominal tenderness.  Musculoskeletal:        General: No swelling or tenderness.     Cervical back: Neck supple. No tenderness.  Lymphadenopathy:     Cervical: No cervical adenopathy.  Skin:    Findings: No erythema or rash.  Neurological:     Mental Status: He is alert.  Psychiatric:        Mood and Affect: Mood normal.        Behavior: Behavior normal.    BP 114/70   Pulse 70   Temp 97.6 F (36.4 C)   Resp 16   Ht 5' 11" (1.803 m)   Wt 236 lb 12.8 oz (107.4 kg)   SpO2 98%   BMI 33.03 kg/m  Wt Readings from Last 3 Encounters:  04/01/21 236 lb 12.8 oz (107.4 kg)  12/02/20 247 lb (112 kg)  08/04/20 239 lb (108.4 kg)     Lab Results  Component Value Date   WBC 7.2 04/01/2021   HGB 17.2 (H) 04/01/2021   HCT 50.5 04/01/2021   PLT 243.0 04/01/2021   GLUCOSE 151 (H)  04/01/2021   CHOL 108 04/01/2021   TRIG 89.0 04/01/2021   HDL 33.90 (L) 04/01/2021   LDLCALC 57 04/01/2021   ALT 49 04/01/2021   AST 20 04/01/2021   NA 136 04/01/2021   K 4.4 04/01/2021   CL 101 04/01/2021   CREATININE 1.04 04/01/2021   BUN 18 04/01/2021   CO2 26 04/01/2021   TSH 1.85 04/01/2021   PSA 0.25 12/02/2020   HGBA1C 7.7 (H) 04/01/2021   MICROALBUR <0.7 12/26/2019    MR 3D Recon At Scanner  Result Date: 07/20/2020 CLINICAL DATA:  Epigastric pain for 1 month. History of pancreatitis. History of ethanol abuse. EXAM: MRI ABDOMEN WITHOUT AND WITH CONTRAST (INCLUDING MRCP) TECHNIQUE: Multiplanar multisequence MR imaging of the abdomen was performed both before and after the administration of intravenous contrast. Heavily T2-weighted images of the biliary and pancreatic ducts were obtained, and three-dimensional MRCP images were rendered by post processing. CONTRAST:  49m GADAVIST GADOBUTROL 1 MMOL/ML IV SOLN field COMPARISON:  Abdominal ultrasound 04/30/2020.  CT 05/02/2020. FINDINGS: Lower chest: Normal heart size without pericardial or pleural effusion. Hepatobiliary: Mild hepatomegaly and marked hepatic steatosis. 18.9 cm craniocaudal. No focal liver lesion. Normal gallbladder. No intra or extrahepatic biliary duct dilatation. No choledocholithiasis. Pancreas: No evidence of acute pancreatitis. No pancreatic duct dilatation. No pancreas divisum. Pancreas enhances normally. Spleen:  Normal in size, without focal abnormality. Adrenals/Urinary Tract: Normal adrenal glands. Normal kidneys, without hydronephrosis. Stomach/Bowel: Tiny hiatal hernia.  Normal abdominal bowel loops. Vascular/Lymphatic: Aortic atherosclerosis. No abdominal adenopathy. Other:  No ascites. Musculoskeletal: No acute osseous abnormality. IMPRESSION: 1. No evidence of acute pancreatitis or other acute abdominal process. No biliary  duct dilatation or choledocholithiasis. 2. Hepatic steatosis and mild hepatomegaly. 3.  Tiny hiatal hernia. Electronically Signed   By: Abigail Miyamoto M.D.   On: 07/20/2020 11:27   MR ABDOMEN MRCP W WO CONTAST  Result Date: 07/20/2020 CLINICAL DATA:  Epigastric pain for 1 month. History of pancreatitis. History of ethanol abuse. EXAM: MRI ABDOMEN WITHOUT AND WITH CONTRAST (INCLUDING MRCP) TECHNIQUE: Multiplanar multisequence MR imaging of the abdomen was performed both before and after the administration of intravenous contrast. Heavily T2-weighted images of the biliary and pancreatic ducts were obtained, and three-dimensional MRCP images were rendered by post processing. CONTRAST:  23m GADAVIST GADOBUTROL 1 MMOL/ML IV SOLN field COMPARISON:  Abdominal ultrasound 04/30/2020.  CT 05/02/2020. FINDINGS: Lower chest: Normal heart size without pericardial or pleural effusion. Hepatobiliary: Mild hepatomegaly and marked hepatic steatosis. 18.9 cm craniocaudal. No focal liver lesion. Normal gallbladder. No intra or extrahepatic biliary duct dilatation. No choledocholithiasis. Pancreas: No evidence of acute pancreatitis. No pancreatic duct dilatation. No pancreas divisum. Pancreas enhances normally. Spleen:  Normal in size, without focal abnormality. Adrenals/Urinary Tract: Normal adrenal glands. Normal kidneys, without hydronephrosis. Stomach/Bowel: Tiny hiatal hernia.  Normal abdominal bowel loops. Vascular/Lymphatic: Aortic atherosclerosis. No abdominal adenopathy. Other:  No ascites. Musculoskeletal: No acute osseous abnormality. IMPRESSION: 1. No evidence of acute pancreatitis or other acute abdominal process. No biliary duct dilatation or choledocholithiasis. 2. Hepatic steatosis and mild hepatomegaly. 3. Tiny hiatal hernia. Electronically Signed   By: KAbigail MiyamotoM.D.   On: 07/20/2020 11:27       Assessment & Plan:   Problem List Items Addressed This Visit     Abnormal liver function tests    Keep sugars under control.  Has adjusted diet.  Lost weight.  Follow liver function tests.          ADHD    Recent behavioral health evaluation.  Appeared to meet criteria for ADHD.  Discussed treatment.  He is interested in trying medication.  Affecting his daily work and activity.  Start low dose adderall.  Follow.  Discussed possible side effects.         Diabetes mellitus [place]    On metformin, jardiance and trulicity.  Discussed low carb diet and exercise.  Follow met b and a1c.  He has adjusted his diet  Lost weight.  Follow.        Relevant Orders   Basic metabolic panel (Completed)   Hemoglobin A1c (Completed)   [place]OH dependence ([place]    Has cut down alcohol intake.  Follow.         Hepatic steatosis    Found on abdominal ultrasound.  Diet, exercise. and weight loss.  Follow liver function tests.  Has adjusted diet and lost weight.  Follow.         Hypertension, essential    Continue hctz and amlodipine.  Blood pressure doing well.  Follow pressures.  Follow metabolic panel.        Relevant Orders   CBC with Differential/Platelet (Completed)   TSH (Completed)   Mild depression ([place]    Doing well.  Stopped zoloft.  Follow.        Mixed hyperlipidemia    On crestor.  Low cholesterol diet and exercise.  Follow lipid panel and liver function tests.         Stress    Was on zoloft.  Off now.  Feels better.  Does not feel he needs to be on anything now.  Follow.  Testosterone deficiency    Has been receiving testosterone replacement.  He cannot tell a difference in how he feels.  Check testosterone level today.        Relevant Orders   Testosterone Total,Free,Bio, Males-([facility]) (Completed)   RESOLVED: Type 2 diabetes mellitus without complication, without long-term current use of insulin ([place])   Other Visit Diagnoses     Hypercholesterolemia       Relevant Orders   Hepatic function panel (Completed)   Lipid panel (Completed)        Einar Pheasant, MD

## 2021-04-02 ENCOUNTER — Telehealth: Payer: Self-pay

## 2021-04-02 LAB — TESTOSTERONE TOTAL,FREE,BIO, MALES
Albumin: 4.6 g/dL (ref 3.6–5.1)
Sex Hormone Binding: 23 nmol/L (ref 10–50)
Testosterone: 126 ng/dL — ABNORMAL LOW (ref 250–827)

## 2021-04-02 MED ORDER — AMPHETAMINE-DEXTROAMPHETAMINE 5 MG PO TABS: 5.0000 mg | ORAL_TABLET | Freq: Two times a day (BID) | ORAL | 0 refills | Status: DC

## 2021-04-02 NOTE — Telephone Encounter (Signed)
Rx sent in for adderall.

## 2021-04-02 NOTE — Telephone Encounter (Signed)
Pt called and stated that he is about to go out of town & was suppoosed to be called something in yesterday for his ADHD. He needs his medication sent in ASAP. I see no documentation of this? I see where he was referred in February for [facility] testing. Please advise?

## 2021-04-02 NOTE — Addendum Note (Signed)
Addended by: Alisa Graff on: 04/02/2021 01:40 PM   Modules accepted: Orders

## 2021-04-03 ENCOUNTER — Other Ambulatory Visit: Payer: Self-pay | Admitting: Internal Medicine

## 2021-04-06 NOTE — Telephone Encounter (Signed)
Medication last sent in 02/03/21 and Patient last seen 04/01/21

## 2021-04-07 ENCOUNTER — Ambulatory Visit: Payer: 59 | Admitting: Psychology

## 2021-04-13 ENCOUNTER — Encounter: Payer: Self-pay | Admitting: Internal Medicine

## 2021-04-13 DIAGNOSIS — F909 Attention-deficit hyperactivity disorder, unspecified type: Secondary | ICD-10-CM | POA: Insufficient documentation

## 2021-04-13 NOTE — Assessment & Plan Note (Signed)
Has been receiving testosterone replacement.  He cannot tell a difference in how he feels.  Check testosterone level today.

## 2021-04-13 NOTE — Assessment & Plan Note (Signed)
Has cut down alcohol intake.  Follow.   

## 2021-04-13 NOTE — Assessment & Plan Note (Signed)
Found on abdominal ultrasound.  Diet, exercise. and weight loss.  Follow liver function tests.  Has adjusted diet and lost weight.  Follow.

## 2021-04-13 NOTE — Assessment & Plan Note (Signed)
Continue hctz and amlodipine.  Blood pressure doing well.  Follow pressures.  Follow metabolic panel.  

## 2021-04-13 NOTE — Assessment & Plan Note (Signed)
Doing well.  Stopped zoloft.  Follow.

## 2021-04-13 NOTE — Assessment & Plan Note (Signed)
Keep sugars under control.  Has adjusted diet.  Lost weight.  Follow liver function tests.

## 2021-04-13 NOTE — Assessment & Plan Note (Signed)
Recent behavioral health evaluation.  Appeared to meet criteria for ADHD.  Discussed treatment.  He is interested in trying medication.  Affecting his daily work and activity.  Start low dose adderall.  Follow.  Discussed possible side effects.

## 2021-04-13 NOTE — Assessment & Plan Note (Signed)
On metformin, jardiance and trulicity.  Discussed low carb diet and exercise.  Follow met b and a1c.  He has adjusted his diet  Lost weight.  Follow.

## 2021-04-13 NOTE — Assessment & Plan Note (Signed)
On crestor.  Low cholesterol diet and exercise.  Follow lipid panel and liver function tests.   

## 2021-04-13 NOTE — Assessment & Plan Note (Signed)
Was on zoloft.  Off now.  Feels better.  Does not feel he needs to be on anything now.  Follow.

## 2021-04-15 ENCOUNTER — Telehealth: Payer: Self-pay

## 2021-04-15 DIAGNOSIS — I10 Essential (primary) hypertension: Secondary | ICD-10-CM

## 2021-04-15 NOTE — Telephone Encounter (Signed)
Ordered Repeat CBC labs.

## 2021-04-23 ENCOUNTER — Other Ambulatory Visit: Payer: Self-pay

## 2021-04-28 ENCOUNTER — Other Ambulatory Visit: Payer: Self-pay | Admitting: Internal Medicine

## 2021-04-29 ENCOUNTER — Other Ambulatory Visit: Payer: 59

## 2021-04-29 MED ORDER — AMPHETAMINE-DEXTROAMPHETAMINE 5 MG PO TABS: 5.0000 mg | ORAL_TABLET | Freq: Two times a day (BID) | ORAL | 0 refills | Status: DC

## 2021-04-30 ENCOUNTER — Other Ambulatory Visit (INDEPENDENT_AMBULATORY_CARE_PROVIDER_SITE_OTHER): Payer: 59

## 2021-04-30 ENCOUNTER — Other Ambulatory Visit: Payer: Self-pay

## 2021-04-30 DIAGNOSIS — I10 Essential (primary) hypertension: Secondary | ICD-10-CM | POA: Diagnosis not present

## 2021-04-30 LAB — CBC WITH DIFFERENTIAL/PLATELET
Basophils Absolute: 0.1 10*3/uL (ref 0.0–0.1)
Basophils Relative: 0.7 % (ref 0.0–3.0)
Eosinophils Absolute: 0.5 10*3/uL (ref 0.0–0.7)
Eosinophils Relative: 6.4 % — ABNORMAL HIGH (ref 0.0–5.0)
HCT: 48.1 % (ref 39.0–52.0)
Hemoglobin: 16.6 g/dL (ref 13.0–17.0)
Lymphocytes Relative: 36 % (ref 12.0–46.0)
Lymphs Abs: 2.7 10*3/uL (ref 0.7–4.0)
MCHC: 34.5 g/dL (ref 30.0–36.0)
MCV: 86 fl (ref 78.0–100.0)
Monocytes Absolute: 0.7 10*3/uL (ref 0.1–1.0)
Monocytes Relative: 8.9 % (ref 3.0–12.0)
Neutro Abs: 3.6 10*3/uL (ref 1.4–7.7)
Neutrophils Relative %: 48 % (ref 43.0–77.0)
Platelets: 255 10*3/uL (ref 150.0–400.0)
RBC: 5.6 Mil/uL (ref 4.22–5.81)
RDW: 13.3 % (ref 11.5–15.5)
WBC: 7.5 10*3/uL (ref 4.0–10.5)

## 2021-05-04 ENCOUNTER — Other Ambulatory Visit: Payer: Self-pay | Admitting: Internal Medicine

## 2021-05-09 ENCOUNTER — Encounter: Payer: Self-pay | Admitting: Internal Medicine

## 2021-05-11 ENCOUNTER — Telehealth: Payer: Self-pay | Admitting: Pharmacist

## 2021-05-11 ENCOUNTER — Ambulatory Visit: Payer: 59 | Admitting: *Deleted

## 2021-05-11 DIAGNOSIS — E1165 Type 2 diabetes mellitus with hyperglycemia: Secondary | ICD-10-CM

## 2021-05-11 DIAGNOSIS — I10 Essential (primary) hypertension: Secondary | ICD-10-CM

## 2021-05-11 NOTE — Telephone Encounter (Addendum)
  Chronic Care Management   Note  05/11/2021 Name: Francisco Jackson MRN: [id number] DOB: [birth year]   Attempted to contact patient for medication management question. Left HIPAA compliant message for patient to return my call at their convenience.      Catie Darnelle Maffucci, PharmD, [place], [place] Clinical Pharmacist [employer] [facility]

## 2021-05-11 NOTE — Chronic Care Management (AMB) (Signed)
Chronic Care Management   CCM RN Visit Note  05/11/2021 Name: Francisco Jackson MRN: [id number] DOB: February 04, 1976  Subjective: Francisco Jackson is a 45 y.o. year old male who is a primary care patient of Einar Pheasant, MD. The care management team was consulted for assistance with disease management and care coordination needs.    Engaged with patient by telephone for initial visit in response to provider referral for case management and/or care coordination services.   Consent to Services:  The patient was given the following information about Chronic Care Management services today, agreed to services, and gave verbal consent: 1. CCM service includes personalized support from designated clinical staff supervised by the primary care provider, including individualized plan of care and coordination with other care providers 2. 24/7 contact phone numbers for assistance for urgent and routine care needs. 3. Service will only be billed when office clinical staff spend 20 minutes or more in a month to coordinate care. 4. Only one practitioner may furnish and bill the service in a calendar month. 5.The patient may stop CCM services at any time (effective at the end of the month) by phone call to the office staff. 6. The patient will be responsible for cost sharing (co-pay) of up to 20% of the service fee (after annual deductible is met). Patient agreed to services and consent obtained.  Patient agreed to services and verbal consent obtained.   Assessment: Review of patient past medical history, allergies, medications, health status, including review of consultants reports, laboratory and other test data, was performed as part of comprehensive evaluation and provision of chronic care management services.   SDOH (Social Determinants of Health) assessments and interventions performed:  SDOH Interventions    Flowsheet Row Most Recent Value  SDOH Interventions   Food Insecurity Interventions Intervention  Not Indicated  Financial Strain Interventions Intervention Not Indicated  Housing Interventions Intervention Not Indicated  Intimate Partner Violence Interventions Intervention Not Indicated  Stress Interventions Intervention Not Indicated  Transportation Interventions Intervention Not Indicated        CCM Care Plan  Allergies  Allergen Reactions   Losartan     Severe Chest pain.    Outpatient Encounter Medications as of 05/11/2021  Medication Sig Note   amLODipine (NORVASC) 10 MG tablet Take 1 tablet (10 mg total) by mouth daily.    amphetamine-dextroamphetamine (ADDERALL) 5 MG tablet Take 1 tablet (5 mg total) by mouth 2 (two) times daily.    azelastine (ASTELIN) 0.1 % nasal spray as needed.    azelastine (OPTIVAR) 0.05 % ophthalmic solution Apply 1 drop to eye as needed.    cholecalciferol (VITAMIN D3) 25 MCG (1000 UNIT) tablet Take 1,000 Units by mouth daily.    fluticasone (FLONASE) 50 MCG/ACT nasal spray USE 2 SPRAYS IN EACH NOSTRIL DAILY    hydrochlorothiazide (MICROZIDE) 12.5 MG capsule Take 1 capsule (12.5 mg total) by mouth daily.    JARDIANCE 10 MG TABS tablet TAKE 1 TABLET BY MOUTH ONCE DAILY BEFOREBREAKFAST    lisinopril (ZESTRIL) 5 MG tablet TAKE 1 TABLET BY MOUTH DAILY    metFORMIN (GLUCOPHAGE-XR) 500 MG 24 hr tablet TAKE 2 TABLETS BY MOUTH 2 TIMES A DAY    montelukast (SINGULAIR) 10 MG tablet TAKE ONE TABLET EVERY MORNING    Multiple Vitamin (MULTIVITAMIN) tablet Take 1 tablet by mouth daily.    rosuvastatin (CRESTOR) 5 MG tablet Take 1 tablet (5 mg total) by mouth daily.    sildenafil (REVATIO) 20 MG tablet Take 2-4 tablets  two hours before intercouse on an empty stomach.  Do not take with nitrates.    TRULICITY 1.5 TG/5.4DI SOPN INJECT 1 PEN ( 1.5 MG ) SUBCUTANEOUSLY ONCE EVERY 7 DAYS.    Continuous Blood Gluc Sensor (FREESTYLE LIBRE 2 SENSOR) MISC Use to check glucose at least TID    Insulin Pen Needle (PEN NEEDLES) 30G X 5 MM MISC Inject 1 Applicatorful into the  skin once a week. Use with trulicity pen    sertraline (ZOLOFT) 100 MG tablet TAKE 1 TABLET BY MOUTH ONCE DAILY (Patient not taking: Reported on 05/11/2021) 05/11/2021: Reports no longer taking   Testosterone 30 MG/ACT SOLN PLACE 3 PUMPS ONTO THE SKIN OF AXILLA EVERY MORNING (Patient not taking: Reported on 05/11/2021) 05/11/2021: Reports no longer taking   No facility-administered encounter medications on file as of 05/11/2021.    Patient Active Problem List   Diagnosis Date Noted   ADHD 04/13/2021   Disturbed concentration 08/10/2020   Hepatic steatosis 05/02/2020   Pancreatitis, acute 05/02/2020   Testosterone deficiency 04/16/2020   Healthcare maintenance 11/02/2019   EtOH dependence ([place]) 06/24/2019   Mild depression ([place]) 06/20/2019   BMI 33.0-33.9,adult 06/04/2019   Neck fullness 06/04/2019   Other mixed anxiety disorders 07/17/2018   Witnessed apneic spells 01/24/2018   Pneumonia 01/11/2018   Abnormal liver function tests 08/15/2017   Erectile dysfunction 06/20/2017   Chest pain 12/20/2016   Stress 10/24/2016   Hypertension, essential 10/24/2016   Diabetes mellitus ([place]) 08/01/2016   Mixed hyperlipidemia 08/01/2016   Frequent episodes of bronchitis 08/01/2016    Conditions to be addressed/monitored:HTN and DMII  Care Plan : [place]  Updates made by Leona Singleton, RN since 05/11/2021 12:00 AM   Problem: Knowledge deficiet related to self care management of chronc care management diseases   Priority: Medium  Note:   Current Barriers:  Knowledge Deficits related to plan of care for management of HTN and DMII  Chronic Disease Management support and education needs related to HTN and DMII; patient reporting he does not check blood sugars often, states when he does they typically range 100-130's.  Latest Hgb A1C increased to 7.7.  Does report compliance with medications.  Also reports he does not check his blood pressures at home.  But states they are typically in good  range when he does.    RNCM Clinical Goal(s):  Patient will verbalize understanding of plan for management of HTN and DMII take all medications exactly as prescribed and will call provider for medication related questions demonstrate improved health management independence verbalize basic understanding of HTN and DMII disease process and self health management plan work with CM team pharmacist to verify dosing and timing of Adderal  through collaboration with RN Care manager, provider, and care team.   Interventions: 1:1 collaboration with primary care provider regarding development and update of comprehensive plan of care as evidenced by provider attestation and co-signature Inter-disciplinary care team collaboration (see longitudinal plan of care)   Diabetes:  (Status: New goal.) Lab Results  Component Value Date   HGBA1C 7.7 (H) 04/01/2021  Assessed patient's understanding of A1c goal: <7% Reviewed medications with patient and discussed importance of medication adherence; Counseled on importance of regular laboratory monitoring as prescribed; Discussed plans with patient for ongoing care management follow up and provided patient with direct contact information for care management team; Provided patient with written educational materials related to hypo and hyperglycemia and importance of correct treatment; Advised patient, providing education and  rationale, to check cbg at least 3 times a week and record, calling PCP for findings outside established parameters; Referral made to pharmacy team for assistance with timing and dosing of Adderal; Encouraged to increase activity as tolerated Discussed and reviewed current A1C and discussed ways to help reduce; encouraged patient to discuss goal with provider  Hypertension: (Status: New goal.) Last practice recorded BP readings:  BP Readings from Last 3 Encounters:  04/01/21 114/70  12/02/20 124/72  05/06/20 110/80  Most recent  eGFR/CrCl: No results found for: EGFR  No components found for: CRCL  Evaluation of current treatment plan related to hypertension self management and patient's adherence to plan as established by provider; Reviewed medications with patient and discussed importance of compliance; Discussed plans with patient for ongoing care management follow up and provided patient with direct contact information for care management team; Discussed complications of poorly controlled blood pressure such as heart disease, stroke, circulatory complications, vision complications, kidney impairment, sexual dysfunction;  Screening for signs and symptoms of depression related to chronic disease state;  Discussed importance of monitoring blood pressures and home and encouraged patient to monitor weekly and notify provider for sustained elevations Sending EMMI video on HTN  Patient Goals/Self-Care Activities: Patient will self administer medications as prescribed Patient will attend all scheduled provider appointments Patient will continue to perform ADL's independently Patient will continue to perform IADL's independently Check blood sugar at least 3 times a day Check blood sugar if I feel it is too high or too low Enter blood sugar readings and medication into daily log Take the blood sugar log to all doctor visits  Check blood pressure weekly Write blood pressure results in a log     Plan:The care management team will reach out to the patient again over the next 75  days per patient request.  Hubert Azure RN, MSN RN Care Management Coordinator [place] [phone] [email or web address]

## 2021-05-11 NOTE — Patient Instructions (Signed)
Visit Information   PATIENT GOALS:   Goals Addressed             This Visit's Progress    (RNCM)  Monitor and Manage My Blood Sugar-Diabetes Type 2       Timeframe:  Long-Range Goal Priority:  Medium Start Date:   05/11/21                          Expected End Date:  12/08/21                     Follow Up Date 07/08/21    Check blood sugar at least 3 times a day Check blood sugar if I feel it is too high or too low Enter blood sugar readings and medication into daily log Take the blood sugar log to all doctor visits    Why is this important?   Checking your blood sugar at home helps to keep it from getting very high or very low.  Writing the results in a diary or log helps the doctor know how to care for you.  Your blood sugar log should have the time, date and the results.  Also, write down the amount of insulin or other medicine that you take.  Other information, like what you ate, exercise done and how you were feeling, will also be helpful.     Notes:      (RNCM) Track and Manage My Blood Pressure       Timeframe:  Long-Range Goal Priority:  Medium Start Date:  05/11/21                           Expected End Date:  12/08/21                     Follow Up Date:   07/08/21   Check blood pressure weekly Write blood pressure results in a log   Why is this important?   You won't feel high blood pressure, but it can still hurt your blood vessels.  High blood pressure can cause heart or kidney problems. It can also cause a stroke.  Making lifestyle changes like losing a little weight or eating less salt will help.  Checking your blood pressure at home and at different times of the day can help to control blood pressure.  If the doctor prescribes medicine remember to take it the way the doctor ordered.  Call the office if you cannot afford the medicine or if there are questions about it.     Notes: 10/30/20 continues to manage well without issues         Consent to CCM  Services: Francisco Jackson was given information about Chronic Care Management services today including:  CCM service includes personalized support from designated clinical staff supervised by his physician, including individualized plan of care and coordination with other care providers 24/7 contact phone numbers for assistance for urgent and routine care needs. Service will only be billed when office clinical staff spend 20 minutes or more in a month to coordinate care. Only one practitioner may furnish and bill the service in a calendar month. The patient may stop CCM services at any time (effective at the end of the month) by phone call to the office staff. The patient will be responsible for cost sharing (co-pay) of up to 20% of the service fee (after annual  deductible is met).  Patient agreed to services and verbal consent obtained.   Patient verbalizes understanding of instructions provided today and agrees to view in [place].   The care management team will reach out to the patient again over the next 75 days per patient request.   Francisco Azure RN, MSN RN Care Management Coordinator [place] [phone] [email or web address]   CLINICAL CARE PLAN: Patient Care Plan: CCM RNCM Care Plan   Problem Identified: Knowledge deficiet related to self care management of chronc care management diseases   Priority: Medium  Note:   Current Barriers:  Knowledge Deficits related to plan of care for management of HTN and DMII  Chronic Disease Management support and education needs related to HTN and DMII; patient reporting he does not check blood sugars often, states when he does they typically range 100-130's.  Latest Hgb A1C increased to 7.7.  Does report compliance with medications.  Also reports he does not check his blood pressures at home.  But states they are typically in good range when he does.    RNCM Clinical Goal(s):  Patient will verbalize  understanding of plan for management of HTN and DMII take all medications exactly as prescribed and will call provider for medication related questions demonstrate improved health management independence verbalize basic understanding of HTN and DMII disease process and self health management plan work with CM team pharmacist to verify dosing and timing of Adderal  through collaboration with RN Care manager, provider, and care team.   Interventions: 1:1 collaboration with primary care provider regarding development and update of comprehensive plan of care as evidenced by provider attestation and co-signature Inter-disciplinary care team collaboration (see longitudinal plan of care)   Diabetes:  (Status: New goal.) Lab Results  Component Value Date   HGBA1C 7.7 (H) 04/01/2021  Assessed patient's understanding of A1c goal: <7% Reviewed medications with patient and discussed importance of medication adherence; Counseled on importance of regular laboratory monitoring as prescribed; Discussed plans with patient for ongoing care management follow up and provided patient with direct contact information for care management team; Provided patient with written educational materials related to hypo and hyperglycemia and importance of correct treatment; Advised patient, providing education and rationale, to check cbg at least 3 times a week and record, calling PCP for findings outside established parameters; Referral made to pharmacy team for assistance with timing and dosing of Adderal; Encouraged to increase activity as tolerated Discussed and reviewed current A1C and discussed ways to help reduce; encouraged patient to discuss goal with provider  Hypertension: (Status: New goal.) Last practice recorded BP readings:  BP Readings from Last 3 Encounters:  04/01/21 114/70  12/02/20 124/72  05/06/20 110/80  Most recent eGFR/CrCl: No results found for: EGFR  No components found for: CRCL  Evaluation  of current treatment plan related to hypertension self management and patient's adherence to plan as established by provider; Reviewed medications with patient and discussed importance of compliance; Discussed plans with patient for ongoing care management follow up and provided patient with direct contact information for care management team; Discussed complications of poorly controlled blood pressure such as heart disease, stroke, circulatory complications, vision complications, kidney impairment, sexual dysfunction;  Screening for signs and symptoms of depression related to chronic disease state;  Discussed importance of monitoring blood pressures and home and encouraged patient to monitor weekly and notify provider for sustained elevations Sending EMMI video on HTN  Patient Goals/Self-Care Activities: Patient will self administer medications as prescribed Patient will  attend all scheduled provider appointments Patient will continue to perform ADL's independently Patient will continue to perform IADL's independently Check blood sugar at least 3 times a day Check blood sugar if I feel it is too high or too low Enter blood sugar readings and medication into daily log Take the blood sugar log to all doctor visits  Check blood pressure weekly Write blood pressure results in a log

## 2021-05-12 ENCOUNTER — Ambulatory Visit: Payer: 59 | Admitting: Pharmacist

## 2021-05-12 DIAGNOSIS — F909 Attention-deficit hyperactivity disorder, unspecified type: Secondary | ICD-10-CM

## 2021-05-12 DIAGNOSIS — E1165 Type 2 diabetes mellitus with hyperglycemia: Secondary | ICD-10-CM

## 2021-05-12 DIAGNOSIS — E782 Mixed hyperlipidemia: Secondary | ICD-10-CM

## 2021-05-12 NOTE — Patient Instructions (Signed)
Visit Information   Goals Addressed               This Visit's Progress     Patient Stated     COMPLETED: PharmD - Medication Monitoring (pt-stated)        Patient Goals/Self-Care Activities Over the next 30 days, patient will:  - take medications as prescribed         Patient verbalizes understanding of instructions provided today and agrees to view in [place].    Plan: Patient has my contact information for future questions or concerns  Catie Darnelle Maffucci, PharmD, [place], [place] Clinical Pharmacist [employer] at [place]

## 2021-05-12 NOTE — Chronic Care Management (AMB) (Signed)
Care Management   Pharmacy Note  05/12/2021 Name: Francisco Jackson MRN: [id number] DOB: 09/25/1976  Subjective: Francisco Jackson is a 45 y.o. year old male who is a primary care patient of Einar Pheasant, MD. The Care Management team was consulted for assistance with care management and care coordination needs.    Engaged with patient by telephone for follow up visit in response to provider referral for pharmacy case management and/or care coordination services.   The patient was given information about Care Management services today including:  Care Management services includes personalized support from designated clinical staff supervised by the patient's primary care provider, including individualized plan of care and coordination with other care providers. 24/7 contact phone numbers for assistance for urgent and routine care needs. The patient may stop case management services at any time by phone call to the office staff.  Patient agreed to services and consent obtained.  Assessment:  Review of patient status, including review of consultants reports, laboratory and other test data, was performed as part of comprehensive evaluation and provision of chronic care management services.   SDOH (Social Determinants of Health) assessments and interventions performed:    Objective:  Lab Results  Component Value Date   CREATININE 1.04 04/01/2021   CREATININE 1.00 12/02/2020   CREATININE 1.05 05/28/2020    Lab Results  Component Value Date   HGBA1C 7.7 (H) 04/01/2021       Component Value Date/Time   CHOL 108 04/01/2021 0832   CHOL 110 06/14/2019 0828   TRIG 89.0 04/01/2021 0832   HDL 33.90 (L) 04/01/2021 0832   HDL 30 (L) 06/14/2019 0828   CHOLHDL 3 04/01/2021 0832   VLDL 17.8 04/01/2021 0832   LDLCALC 57 04/01/2021 0832   LDLCALC 60 06/14/2019 0828     BP Readings from Last 3 Encounters:  04/01/21 114/70  12/02/20 124/72  05/06/20 110/80    Care Plan  Allergies   Allergen Reactions   Losartan     Severe Chest pain.    Medications Reviewed Today     Reviewed by Leona Singleton, RN (Registered Nurse) on 05/11/21 at 1016  Med List Status: <None>   Medication Order Taking? Sig Documenting Provider Last Dose Status Informant  amLODipine (NORVASC) 10 MG tablet [id number] Yes Take 1 tablet (10 mg total) by mouth daily. Einar Pheasant, MD Taking Active   amphetamine-dextroamphetamine (ADDERALL) 5 MG tablet [id number] Yes Take 1 tablet (5 mg total) by mouth 2 (two) times daily. Einar Pheasant, MD Taking Active   azelastine (ASTELIN) 0.1 % nasal spray [id number] Yes as needed. [provider] Taking Active   azelastine (OPTIVAR) 0.05 % ophthalmic solution [id number] Yes Apply 1 drop to eye as needed. [provider] Taking Active   cholecalciferol (VITAMIN D3) 25 MCG (1000 UNIT) tablet [id number] Yes Take 1,000 Units by mouth daily. [provider] Taking Active   Continuous Blood Gluc Sensor (FREESTYLE LIBRE 2 SENSOR) [place] [id number]  Use to check glucose at least TID Einar Pheasant, MD  Active   fluticasone [facility]) 50 MCG/ACT nasal spray [id number] Yes USE 2 SPRAYS IN EACH NOSTRIL DAILY Einar Pheasant, MD Taking Active   hydrochlorothiazide (MICROZIDE) 12.5 MG capsule [id number] Yes Take 1 capsule (12.5 mg total) by mouth daily. Einar Pheasant, MD Taking Active   Insulin Pen Needle (PEN NEEDLES) 30G X 5 MM MISC [id number]  Inject 1 Applicatorful into the skin once a week. Use with trulicity pen Guse, Jacquelynn Cree, FNP  Active   JARDIANCE  10 MG TABS tablet [id number] Yes TAKE 1 TABLET BY MOUTH ONCE DAILY Carolann Littler, MD Taking Active   lisinopril (ZESTRIL) 5 MG tablet [id number] Yes TAKE 1 TABLET BY MOUTH DAILY Einar Pheasant, MD Taking Active   metFORMIN (GLUCOPHAGE-XR) 500 MG 24 hr tablet [id number] Yes TAKE 2 TABLETS BY MOUTH 2 TIMES A Maxcine Ham, Randell Patient, MD Taking Active   montelukast (SINGULAIR) 10 MG tablet  [id number] Yes TAKE ONE TABLET EVERY MORNING Einar Pheasant, MD Taking Active   Multiple Vitamin (MULTIVITAMIN) tablet [id number] Yes Take 1 tablet by mouth daily. [provider] Taking Active   rosuvastatin (CRESTOR) 5 MG tablet [id number] Yes Take 1 tablet (5 mg total) by mouth daily. Einar Pheasant, MD Taking Active   sertraline (ZOLOFT) 100 MG tablet [id number] No TAKE 1 TABLET BY MOUTH ONCE DAILY  Patient not taking: Reported on 05/11/2021   Einar Pheasant, MD Not Taking Active            Med Note Sadie Haber May 11, 2021 10:15 AM) Reports no longer taking  sildenafil (REVATIO) 20 MG tablet [id number] Yes Take 2-4 tablets two hours before intercouse on an empty stomach.  Do not take with nitrates. Einar Pheasant, MD Taking Active   Testosterone 30 MG/ACT Bailey Mech [id number] No PLACE 3 PUMPS ONTO THE SKIN OF AXILLA EVERY MORNING  Patient not taking: Reported on 05/11/2021   Einar Pheasant, MD Not Taking Active            Med Note Sadie Haber May 11, 2021 10:16 AM) Reports no longer taking  TRULICITY 1.5 0000000 SOPN [id number] Yes INJECT 1 PEN ( 1.5 MG ) SUBCUTANEOUSLY ONCE EVERY 7 DAYS. Einar Pheasant, MD Taking Active             Patient Active Problem List   Diagnosis Date Noted   ADHD 04/13/2021   Disturbed concentration 08/10/2020   Hepatic steatosis 05/02/2020   Pancreatitis, acute 05/02/2020   Testosterone deficiency 04/16/2020   Healthcare maintenance 11/02/2019   EtOH dependence ([place]) 06/24/2019   Mild depression ([place]) 06/20/2019   BMI 33.0-33.9,adult 06/04/2019   Neck fullness 06/04/2019   Other mixed anxiety disorders 07/17/2018   Witnessed apneic spells 01/24/2018   Pneumonia 01/11/2018   Abnormal liver function tests 08/15/2017   Erectile dysfunction 06/20/2017   Chest pain 12/20/2016   Stress 10/24/2016   Hypertension, essential 10/24/2016   Diabetes mellitus ([place]) 08/01/2016   Mixed hyperlipidemia 08/01/2016   Frequent episodes  of bronchitis 08/01/2016    Conditions to be addressed/monitored: HTN, HLD, and DMII  Care Plan : Medication Management  Updates made by De Hollingshead, RPH-CPP since 05/12/2021 12:00 AM     Problem: DM, ADHD      Long-Range Goal: Disease Progression Prevention Completed 05/12/2021  Start Date: 05/12/2021  This Visit's Progress: On track  Priority: High  Note:   Current Barriers:  Unable to maintain control of focus  Pharmacist Clinical Goal(s):  Over the next 30 days, patient will maintain control of ADHD through collaboration with PharmD and provider.   Interventions: 1:1 collaboration with Einar Pheasant, MD regarding development and update of comprehensive plan of care as evidenced by provider attestation and co-signature Inter-disciplinary care team collaboration (see longitudinal plan of care) Comprehensive medication review performed; medication list updated in electronic medical record  ADHD: Uncontrolled per patient report; current regimen: Adderall 5 mg BID Reports for the first few days, felt benefit. Now feels reduced benefit from  8-11 am, but then loses focus until second dose around 3 pm. Denies impact on sleep. Denies any palpitations, increase in BP.  Discussed duration of action of ~ 4-6 hours.  Recommend to increase dose to 10 mg BID. Will collaborate w/ PCP to discuss.    Diabetes: Controlled; current treatment: metformin XR 1000 mg BID, Jardiance 10 mg daily, Trulicity 1.5 mg weekly Previously recommended to continue current regimen at this time  Hypertension: Controlled per most recent clinic reading; current treatment: lisinopril 5 mg daily, amlodipine 10 mg daily, HCTZ 12.5 mg daily Previously recommended to continue current regimen at this time   Hyperlipidemia: Controlled; current treatment: rosuvastatin 5 mg daily Previously recommended to continue current regimen at this time   Asthma/Allergies: Controlled; current treatment: montelukast 10 mg  daily;  Previously recommended to continue current regimen at this time   Depression: Well managed per patient report; current regimen; sertraline 100 mg daily Previously recommended to continue current regimen at this time   Low Testosterone: Controlled per last testosterone level; testosterone 30 mg pump, 2 pumps daily; sildenafil 20 mg PRN Recommended to continue current regimen at this time.  Patient Goals/Self-Care Activities Over the next 30 days, patient will:  - take medications as prescribed  Follow Up Plan: No PharmD follow up required.      Medication Assistance:  None required.  Patient affirms current coverage meets needs.  Follow Up:  No PharmD follow up required at this time  Plan: Patient has my contact information for future questions or concerns  Catie Darnelle Maffucci, PharmD, [place], [place] Pharmacist [employer] at [employer] [phone]

## 2021-05-12 NOTE — Telephone Encounter (Signed)
Patient returned call. See CCM documentation 

## 2021-06-03 ENCOUNTER — Ambulatory Visit: Payer: 59 | Admitting: Dermatology

## 2021-06-06 ENCOUNTER — Other Ambulatory Visit: Payer: Self-pay | Admitting: Internal Medicine

## 2021-06-11 ENCOUNTER — Other Ambulatory Visit: Payer: Self-pay | Admitting: Internal Medicine

## 2021-06-11 ENCOUNTER — Telehealth: Payer: Self-pay

## 2021-06-11 NOTE — Telephone Encounter (Signed)
RX Refill:adderall Last Seen:04-01-21 Last ordered:04-29-21

## 2021-06-11 NOTE — Telephone Encounter (Signed)
Pt called and asked to speak with you. Please call him back at your convenience

## 2021-06-12 NOTE — Telephone Encounter (Signed)
Reports this was addressed by [place] today.

## 2021-06-12 NOTE — Telephone Encounter (Signed)
Received request for refill adderall.  Per Catie's last note, felt needed to increase dose.  He is currently receiving adderall '5mg'$  bid.  Does he feel the need to increase dose.  If so, I will send in rx for '10mg'$  bid.  Just need to clarify if he feels needs adjustment in medication.

## 2021-06-12 NOTE — Telephone Encounter (Signed)
Patient feels like he needs to increase 10 mg bid

## 2021-06-13 MED ORDER — AMPHETAMINE-DEXTROAMPHETAMINE 10 MG PO TABS: 10.0000 mg | ORAL_TABLET | Freq: Two times a day (BID) | ORAL | 0 refills | Status: DC

## 2021-06-13 NOTE — Telephone Encounter (Signed)
Rx sent in for adderall '10mg'$  tablets.  My chart message sent to [name].

## 2021-07-06 ENCOUNTER — Other Ambulatory Visit: Payer: Self-pay | Admitting: Internal Medicine

## 2021-07-08 ENCOUNTER — Telehealth: Payer: Self-pay | Admitting: *Deleted

## 2021-07-08 ENCOUNTER — Telehealth: Payer: 59

## 2021-07-08 NOTE — Telephone Encounter (Signed)
  Care Management   Follow Up Note   07/08/2021 Name: Francisco Jackson MRN: [id number] DOB: [birth year]   Referred by: Einar Pheasant, MD Reason for referral : Care Coordination (DM)   An unsuccessful telephone outreach was attempted today. The patient was referred to the case management team for assistance with care management and care coordination.   Follow Up Plan:  RNCM will seek assistance from Care Guides in rescheduling appointment within the next 30 days.    Hubert Azure RN, MSN RN Care Management Coordinator [place] [phone] [name]

## 2021-07-13 ENCOUNTER — Telehealth: Payer: Self-pay

## 2021-07-13 NOTE — Chronic Care Management (AMB) (Signed)
  Care Management   Note  07/13/2021 Name: [name] MRN: [id number] DOB: 10-Sep-1976  Francisco Jackson is a 45 y.o. year old male who is a primary care patient of Einar Pheasant, MD and is actively engaged with the care management team. I reached out to Celene Squibb by phone today to assist with re-scheduling a follow up visit with the RN Case Manager  Follow up plan: Unsuccessful telephone outreach attempt made. A HIPAA compliant phone message was left for the patient providing contact information and requesting a return call.  The care management team will reach out to the patient again over the next 7 days.  If patient returns call to provider office, please advise to call [facility]  at [place], [place], [place], [place] [address] Direct Dial: [phone] [email or web address]

## 2021-07-20 ENCOUNTER — Other Ambulatory Visit: Payer: Self-pay | Admitting: Internal Medicine

## 2021-07-20 NOTE — Telephone Encounter (Signed)
Patient is calling to check on the status of the refill below.Please advise.

## 2021-07-20 NOTE — Telephone Encounter (Signed)
RX Refill:adderall Last Seen:04-01-21 Last ordered:06-13-21

## 2021-07-20 NOTE — Telephone Encounter (Signed)
I refilled the adderall x 1,but he needs a f/u appt before next refill.  Is overdue.

## 2021-07-31 NOTE — Chronic Care Management (AMB) (Signed)
  Care Management   Note  07/31/2021 Name: [name] MRN: [id number] DOB: 03-15-1976  Francisco Jackson is a 45 y.o. year old male who is a primary care patient of Einar Pheasant, MD and is actively engaged with the care management team. I reached out to Celene Squibb by phone today to assist with re-scheduling a follow up visit with the RN Case Manager  Follow up plan: Unsuccessful telephone outreach attempt made. A HIPAA compliant phone message was left for the patient providing contact information and requesting a return call.  The care management team will reach out to the patient again over the next 7 days.  If patient returns call to provider office, please advise to call [facility]  at [place], [place], [place], [place] [address] Direct Dial: [phone] [email or web address]: [email or web address]

## 2021-08-04 ENCOUNTER — Other Ambulatory Visit: Payer: Self-pay | Admitting: Internal Medicine

## 2021-08-25 ENCOUNTER — Other Ambulatory Visit: Payer: Self-pay | Admitting: Internal Medicine

## 2021-08-25 NOTE — Telephone Encounter (Signed)
He is overdue a f/u appt.  Please schedule appt.  I cannot continue to refill adderall without seeing him.  See last refill note.

## 2021-08-25 NOTE — Telephone Encounter (Signed)
RX Refill: adderall Last Seen: 04-01-21 Last Ordered: 07-20-21 Next Appt: na

## 2021-08-26 ENCOUNTER — Telehealth: Payer: Self-pay | Admitting: Internal Medicine

## 2021-08-26 NOTE — Telephone Encounter (Signed)
Pt called in regards to medication adderall 10mg  tablet. Pt contacted pharmacy and was advised to make an appt in order to get a refill for this prescription. Pt has been scheduled an OV with Dr. Nicki Reaper 11/17 at 10:30

## 2021-08-26 NOTE — Telephone Encounter (Signed)
Patient has been scheduled,

## 2021-08-26 NOTE — Telephone Encounter (Signed)
Noted  

## 2021-08-27 ENCOUNTER — Telehealth (INDEPENDENT_AMBULATORY_CARE_PROVIDER_SITE_OTHER): Payer: 59 | Admitting: Internal Medicine

## 2021-08-27 ENCOUNTER — Ambulatory Visit: Payer: 59 | Admitting: Adult Health

## 2021-08-27 DIAGNOSIS — F439 Reaction to severe stress, unspecified: Secondary | ICD-10-CM

## 2021-08-27 DIAGNOSIS — I10 Essential (primary) hypertension: Secondary | ICD-10-CM | POA: Diagnosis not present

## 2021-08-27 DIAGNOSIS — Z125 Encounter for screening for malignant neoplasm of prostate: Secondary | ICD-10-CM | POA: Diagnosis not present

## 2021-08-27 DIAGNOSIS — E1165 Type 2 diabetes mellitus with hyperglycemia: Secondary | ICD-10-CM

## 2021-08-27 DIAGNOSIS — K760 Fatty (change of) liver, not elsewhere classified: Secondary | ICD-10-CM | POA: Diagnosis not present

## 2021-08-27 DIAGNOSIS — R69 Illness, unspecified: Secondary | ICD-10-CM | POA: Diagnosis not present

## 2021-08-27 DIAGNOSIS — F909 Attention-deficit hyperactivity disorder, unspecified type: Secondary | ICD-10-CM

## 2021-08-27 DIAGNOSIS — E782 Mixed hyperlipidemia: Secondary | ICD-10-CM

## 2021-08-27 DIAGNOSIS — F1029 Alcohol dependence with unspecified alcohol-induced disorder: Secondary | ICD-10-CM

## 2021-08-27 MED ORDER — AMPHETAMINE-DEXTROAMPHETAMINE 10 MG PO TABS: 10.0000 mg | ORAL_TABLET | Freq: Two times a day (BID) | ORAL | 0 refills | Status: DC

## 2021-08-27 NOTE — Progress Notes (Deleted)
Patient ID: Francisco Jackson, male   DOB: 1975/12/12, 45 y.o.   MRN: [id number]   Subjective:    Patient ID: Francisco Jackson, male    DOB: 1976/05/06, 45 y.o.   MRN: [id number]  This visit occurred during the SARS-CoV-2 public health emergency.  Safety protocols were in place, including screening questions prior to the visit, additional usage of staff PPE, and extensive cleaning of exam room while observing appropriate contact time as indicated for disinfecting solutions.   Patient here for  Chief Complaint  Patient presents with   Medication Refill    Adderall refill   .   HPI    Past Medical History:  Diagnosis Date   Asthma    Atypical mole 04/05/2013   Left forearm. Atypical combined nevus, moderate atypia.   Diabetes mellitus without complication ([place])    Dysplastic nevus 04/23/2014   Left prox. mid. tricep axilla. Mild atypia, margins free.   Hypertension    Personality disorder ([place])    borderline   Past Surgical History:  Procedure Laterality Date   ANTERIOR CRUCIATE LIGAMENT REPAIR Right 2006   CERVICAL [place] SURGERY  2010   COLONOSCOPY WITH PROPOFOL N/A 11/01/2019   Procedure: COLONOSCOPY WITH PROPOFOL;  Surgeon: Jonathon Bellows, MD;  [facility] ENDOSCOPY;  Service: Gastroenterology;  Laterality: N/A;   MENISCUS REPAIR Left 1996   Family History  Problem Relation Age of Onset   Diabetes Mother    Melanoma Mother    Diabetes Paternal Aunt    Heart attack Paternal Aunt    Diabetes Paternal Uncle    Diabetes Maternal Grandmother    Diabetes Maternal Grandfather    Heart disease Maternal Grandfather    Heart attack Maternal Grandfather    Heart attack Father    Hypertension Father    Diabetes Sister    Hypertension Sister    Kidney cancer Neg Hx    Kidney disease Neg Hx    Prostate cancer Neg Hx    Social History   Socioeconomic History   Marital status: Married    Spouse name: Nevin Bloodgood   Number of children: Not on file   Years of education: Not on  file   Highest education level: Not on file  Occupational History   Occupation: plumber    Comment: owner of plumbing company   Tobacco Use   Smoking status: Former    Packs/day: 0.50    Years: 10.00    Pack years: 5.00    Types: Cigarettes    Quit date: 10/11/2002    Years since quitting: 18.8   Smokeless tobacco: Former    Types: Snuff    Quit date: 10/11/2009  Vaping Use   Vaping Use: Never used  Substance and Sexual Activity   Alcohol use: Yes    Alcohol/week: 4.0 standard drinks    Types: 4 Glasses of wine per week    Comment: ocassional   Drug use: No   Sexual activity: Not on file  Other Topics Concern   Not on file  Social History Narrative   Not on file   Social Determinants of Health   Financial Resource Strain: Low Risk    Difficulty of Paying Living Expenses: Not hard at all  Food Insecurity: No Food Insecurity   Worried About Charity fundraiser in the Last Year: Never true   [place] in the Last Year: Never true  Transportation Needs: No Transportation Needs   Lack of Transportation (Medical): No  Lack of Transportation (Non-Medical): No  Physical Activity: Not on file  Stress: No Stress Concern Present   Feeling of Stress : Not at all  Social Connections: Not on file     Review of Systems     Objective:     There were no vitals taken for this visit. Wt Readings from Last 3 Encounters:  04/01/21 236 lb 12.8 oz (107.4 kg)  12/02/20 247 lb (112 kg)  08/04/20 239 lb (108.4 kg)    Physical Exam   Outpatient Encounter Medications as of 08/27/2021  Medication Sig   amLODipine (NORVASC) 10 MG tablet TAKE 1 TABLET BY MOUTH ONCE DAILY   amphetamine-dextroamphetamine (ADDERALL) 10 MG tablet TAKE ONE TABLET BY MOUTH TWICE DAILY   azelastine (ASTELIN) 0.1 % nasal spray as needed.   azelastine (OPTIVAR) 0.05 % ophthalmic solution Apply 1 drop to eye as needed.   cholecalciferol (VITAMIN D3) 25 MCG (1000 UNIT) tablet Take 1,000 Units by mouth  daily.   Continuous Blood Gluc Sensor (FREESTYLE LIBRE 2 SENSOR) MISC Use to check glucose at least TID   fluticasone (FLONASE) 50 MCG/ACT nasal spray USE 2 SPRAYS IN EACH NOSTRIL DAILY   hydrochlorothiazide (MICROZIDE) 12.5 MG capsule TAKE 1 CAPSULE BY MOUTH ONCE DAILY   Insulin Pen Needle (PEN NEEDLES) 30G X 5 MM MISC Inject 1 Applicatorful into the skin once a week. Use with trulicity pen   JARDIANCE 10 MG TABS tablet TAKE 1 TABLET BY MOUTH ONCE DAILY BEFOREBREAKFAST   lisinopril (ZESTRIL) 5 MG tablet TAKE 1 TABLET BY MOUTH DAILY   metFORMIN (GLUCOPHAGE-XR) 500 MG 24 hr tablet TAKE 2 TABLETS BY MOUTH 2 TIMES A DAY   montelukast (SINGULAIR) 10 MG tablet TAKE ONE TABLET EVERY MORNING   Multiple Vitamin (MULTIVITAMIN) tablet Take 1 tablet by mouth daily.   rosuvastatin (CRESTOR) 5 MG tablet Take 1 tablet (5 mg total) by mouth daily.   sildenafil (REVATIO) 20 MG tablet Take 2-4 tablets two hours before intercouse on an empty stomach.  Do not take with nitrates.   TRULICITY 1.5 FX/9.0WI SOPN INJECT 1.5MG   SUBQ ONCE EVERY 7 DAYS   No facility-administered encounter medications on file as of 08/27/2021.     Lab Results  Component Value Date   WBC 7.5 04/30/2021   HGB 16.6 04/30/2021   HCT 48.1 04/30/2021   PLT 255.0 04/30/2021   GLUCOSE 151 (H) 04/01/2021   CHOL 108 04/01/2021   TRIG 89.0 04/01/2021   HDL 33.90 (L) 04/01/2021   LDLCALC 57 04/01/2021   ALT 49 04/01/2021   AST 20 04/01/2021   NA 136 04/01/2021   K 4.4 04/01/2021   CL 101 04/01/2021   CREATININE 1.04 04/01/2021   BUN 18 04/01/2021   CO2 26 04/01/2021   TSH 1.85 04/01/2021   PSA 0.25 12/02/2020   HGBA1C 7.7 (H) 04/01/2021   MICROALBUR <0.7 12/26/2019    MR 3D Recon At Scanner  Result Date: 07/20/2020 CLINICAL DATA:  Epigastric pain for 1 month. History of pancreatitis. History of ethanol abuse. EXAM: MRI ABDOMEN WITHOUT AND WITH CONTRAST (INCLUDING MRCP) TECHNIQUE: Multiplanar multisequence MR imaging of the  abdomen was performed both before and after the administration of intravenous contrast. Heavily T2-weighted images of the biliary and pancreatic ducts were obtained, and three-dimensional MRCP images were rendered by post processing. CONTRAST:  60mL GADAVIST GADOBUTROL 1 MMOL/ML IV SOLN field COMPARISON:  Abdominal ultrasound 04/30/2020.  CT 05/02/2020. FINDINGS: Lower chest: Normal heart size without pericardial or pleural effusion. Hepatobiliary: Mild  hepatomegaly and marked hepatic steatosis. 18.9 cm craniocaudal. No focal liver lesion. Normal gallbladder. No intra or extrahepatic biliary duct dilatation. No choledocholithiasis. Pancreas: No evidence of acute pancreatitis. No pancreatic duct dilatation. No pancreas divisum. Pancreas enhances normally. Spleen:  Normal in size, without focal abnormality. Adrenals/Urinary Tract: Normal adrenal glands. Normal kidneys, without hydronephrosis. Stomach/Bowel: Tiny hiatal hernia.  Normal abdominal bowel loops. Vascular/Lymphatic: Aortic atherosclerosis. No abdominal adenopathy. Other:  No ascites. Musculoskeletal: No acute osseous abnormality. IMPRESSION: 1. No evidence of acute pancreatitis or other acute abdominal process. No biliary duct dilatation or choledocholithiasis. 2. Hepatic steatosis and mild hepatomegaly. 3. Tiny hiatal hernia. Electronically Signed   By: Abigail Miyamoto M.D.   On: 07/20/2020 11:27   MR ABDOMEN MRCP W WO CONTAST  Result Date: 07/20/2020 CLINICAL DATA:  Epigastric pain for 1 month. History of pancreatitis. History of ethanol abuse. EXAM: MRI ABDOMEN WITHOUT AND WITH CONTRAST (INCLUDING MRCP) TECHNIQUE: Multiplanar multisequence MR imaging of the abdomen was performed both before and after the administration of intravenous contrast. Heavily T2-weighted images of the biliary and pancreatic ducts were obtained, and three-dimensional MRCP images were rendered by post processing. CONTRAST:  8mL GADAVIST GADOBUTROL 1 MMOL/ML IV SOLN field  COMPARISON:  Abdominal ultrasound 04/30/2020.  CT 05/02/2020. FINDINGS: Lower chest: Normal heart size without pericardial or pleural effusion. Hepatobiliary: Mild hepatomegaly and marked hepatic steatosis. 18.9 cm craniocaudal. No focal liver lesion. Normal gallbladder. No intra or extrahepatic biliary duct dilatation. No choledocholithiasis. Pancreas: No evidence of acute pancreatitis. No pancreatic duct dilatation. No pancreas divisum. Pancreas enhances normally. Spleen:  Normal in size, without focal abnormality. Adrenals/Urinary Tract: Normal adrenal glands. Normal kidneys, without hydronephrosis. Stomach/Bowel: Tiny hiatal hernia.  Normal abdominal bowel loops. Vascular/Lymphatic: Aortic atherosclerosis. No abdominal adenopathy. Other:  No ascites. Musculoskeletal: No acute osseous abnormality. IMPRESSION: 1. No evidence of acute pancreatitis or other acute abdominal process. No biliary duct dilatation or choledocholithiasis. 2. Hepatic steatosis and mild hepatomegaly. 3. Tiny hiatal hernia. Electronically Signed   By: Abigail Miyamoto M.D.   On: 07/20/2020 11:27       Assessment & Plan:   Problem List Items Addressed This Visit   None    Einar Pheasant, MD

## 2021-09-04 ENCOUNTER — Other Ambulatory Visit: Payer: Self-pay | Admitting: Internal Medicine

## 2021-09-04 ENCOUNTER — Encounter: Payer: Self-pay | Admitting: Internal Medicine

## 2021-09-04 MED ORDER — AMPHETAMINE-DEXTROAMPHETAMINE 10 MG PO TABS: 10.0000 mg | ORAL_TABLET | Freq: Two times a day (BID) | ORAL | 0 refills | Status: DC

## 2021-09-04 NOTE — Assessment & Plan Note (Signed)
Continue hctz and amlodipine.  Blood pressure doing well.  Follow pressures.  Follow metabolic panel.  

## 2021-09-04 NOTE — Assessment & Plan Note (Signed)
Has cut down alcohol intake.  Follow.   

## 2021-09-04 NOTE — Progress Notes (Signed)
Patient ID: Francisco Jackson, male   DOB: 05-Dec-1975, 45 y.o.   MRN: [id number]   Virtual Visit via video Note  This visit type was conducted due to national recommendations for restrictions regarding the COVID-19 pandemic (e.g. social distancing).  This format is felt to be most appropriate for this patient at this time.  All issues noted in this document were discussed and addressed.  No physical exam was performed (except for noted visual exam findings with Video Visits).   I connected with Cordelia Pen by a video enabled telemedicine application and verified that I am speaking with the correct person using two identifiers. Location patient: home Location provider: work Persons participating in the virtual visit: patient, provider  The limitations, risks, security and privacy concerns of performing an evaluation and management service by video and the availability of in person appointments have been discussed.  It has also been discussed with the patient that there may be a patient responsible charge related to this service. The patient expressed understanding and agreed to proceed.   Reason for visit: follow up appt  HPI: Was overdue f/u.  On adderall.  Doing well with the medication.  Allows him to focus better.  Also feels has helped him with controlling his temper.  Reported had covid a few weeks ago while in [place].  Feels better.  No residual problems.  States blood pressure is doing well.  Blood pressures averaging 120/80s.  No chest pain or sob reported.  No abdominal pain reported.  Taking metformin, jardiance and trulicity.  Due labs.  Has cut down alcohol intake.     ROS: See pertinent positives and negatives per HPI.  Past Medical History:  Diagnosis Date   Asthma    Atypical mole 04/05/2013   Left forearm. Atypical combined nevus, moderate atypia.   Diabetes mellitus without complication ([place])    Dysplastic nevus 04/23/2014   Left prox. mid. tricep axilla. Mild atypia,  margins free.   Hypertension    Personality disorder ([place])    borderline    Past Surgical History:  Procedure Laterality Date   ANTERIOR CRUCIATE LIGAMENT REPAIR Right 2006   CERVICAL [place] SURGERY  2010   COLONOSCOPY WITH PROPOFOL N/A 11/01/2019   Procedure: COLONOSCOPY WITH PROPOFOL;  Surgeon: Jonathon Bellows, MD;  Location: [facility] ENDOSCOPY;  Service: Gastroenterology;  Laterality: N/A;   MENISCUS REPAIR Left 1996    Family History  Problem Relation Age of Onset   Diabetes Mother    Melanoma Mother    Diabetes Paternal Aunt    Heart attack Paternal Aunt    Diabetes Paternal Uncle    Diabetes Maternal Grandmother    Diabetes Maternal Grandfather    Heart disease Maternal Grandfather    Heart attack Maternal Grandfather    Heart attack Father    Hypertension Father    Diabetes Sister    Hypertension Sister    Kidney cancer Neg Hx    Kidney disease Neg Hx    Prostate cancer Neg Hx     SOCIAL HX: reviewed.    Current Outpatient Medications:    amLODipine (NORVASC) 10 MG tablet, TAKE 1 TABLET BY MOUTH ONCE DAILY, Disp: 90 tablet, Rfl: 1   amphetamine-dextroamphetamine (ADDERALL) 10 MG tablet, Take 1 tablet (10 mg total) by mouth 2 (two) times daily., Disp: 60 tablet, Rfl: 0   azelastine (ASTELIN) 0.1 % nasal spray, as needed., Disp: , Rfl: 2   azelastine (OPTIVAR) 0.05 % ophthalmic solution, Apply 1 drop to eye as  needed., Disp: , Rfl:    cholecalciferol (VITAMIN D3) 25 MCG (1000 UNIT) tablet, Take 1,000 Units by mouth daily., Disp: , Rfl:    Continuous Blood Gluc Sensor (FREESTYLE LIBRE 2 SENSOR) MISC, Use to check glucose at least TID, Disp: 2 each, Rfl: 11   fluticasone (FLONASE) 50 MCG/ACT nasal spray, USE 2 SPRAYS IN EACH NOSTRIL DAILY, Disp: 16 g, Rfl: 3   hydrochlorothiazide (MICROZIDE) 12.5 MG capsule, TAKE 1 CAPSULE BY MOUTH ONCE DAILY, Disp: 90 capsule, Rfl: 1   Insulin Pen Needle (PEN NEEDLES) 30G X 5 MM MISC, Inject 1 Applicatorful into the skin once a week. Use with  trulicity pen, Disp: 161 each, Rfl: 2   JARDIANCE 10 MG TABS tablet, TAKE 1 TABLET BY MOUTH ONCE DAILY BEFOREBREAKFAST, Disp: 90 tablet, Rfl: 1   lisinopril (ZESTRIL) 5 MG tablet, TAKE 1 TABLET BY MOUTH DAILY, Disp: 90 tablet, Rfl: 1   metFORMIN (GLUCOPHAGE-XR) 500 MG 24 hr tablet, TAKE 2 TABLETS BY MOUTH 2 TIMES A DAY, Disp: 360 tablet, Rfl: 2   montelukast (SINGULAIR) 10 MG tablet, TAKE ONE TABLET EVERY MORNING, Disp: 30 tablet, Rfl: 0   Multiple Vitamin (MULTIVITAMIN) tablet, Take 1 tablet by mouth daily., Disp: , Rfl:    rosuvastatin (CRESTOR) 5 MG tablet, Take 1 tablet (5 mg total) by mouth daily., Disp: 90 tablet, Rfl: 3   sildenafil (REVATIO) 20 MG tablet, Take 2-4 tablets two hours before intercouse on an empty stomach.  Do not take with nitrates., Disp: 30 tablet, Rfl: 1   TRULICITY 1.5 WR/6.0AV SOPN, INJECT 1.5MG  SUBQ ONCE EVERY 7 DAYS, Disp: 2 mL, Rfl: 1  EXAM:  VITALS per patient if applicable: 409/81X  GENERAL: alert, oriented, appears well and in no acute distress  HEENT: atraumatic, conjunttiva clear, no obvious abnormalities on inspection of external nose and ears  NECK: normal movements of the head and neck  LUNGS: on inspection no signs of respiratory distress, breathing rate appears normal, no obvious gross SOB, gasping or wheezing  CV: no obvious cyanosis  PSYCH/NEURO: pleasant and cooperative, no obvious depression or anxiety, speech and thought processing grossly intact  ASSESSMENT AND PLAN:  Discussed the following assessment and plan:  Problem List Items Addressed This Visit     ADHD    Doing well on adderall.  Focus is better.  Helping with temper.  Follow.       Diabetes mellitus ([place])    On metformin, jardiance and trulicity.  Discussed low carb diet and exercise.  Follow met b and a1c.         Relevant Orders   Basic metabolic panel   Microalbumin / creatinine urine ratio   Hemoglobin A1c   EtOH dependence ([place])    Has cut down alcohol intake.   Follow.        Hepatic steatosis    Found on abdominal ultrasound.  Diet, exercise. and weight loss.  Follow liver function tests.  Diet and exercise.         Relevant Orders   Hepatic function panel   Hypertension, essential    Continue hctz and amlodipine.  Blood pressure doing well.  Follow pressures.  Follow metabolic panel.       Relevant Orders   CBC with Differential/Platelet   Mixed hyperlipidemia    On crestor.  Low cholesterol diet and exercise.  Follow lipid panel and liver function tests.        Relevant Orders   Lipid panel   Stress  Overall doing better.  Follow.        Other Visit Diagnoses     Prostate cancer screening    -  Primary   Relevant Orders   PSA       Return in about 3 months (around 11/27/2021) for physical.   I discussed the assessment and treatment plan with the patient. The patient was provided an opportunity to ask questions and all were answered. The patient agreed with the plan and demonstrated an understanding of the instructions.   The patient was advised to call back or seek an in-person evaluation if the symptoms worsen or if the condition fails to improve as anticipated.   Einar Pheasant, MD

## 2021-09-04 NOTE — Assessment & Plan Note (Signed)
On crestor.  Low cholesterol diet and exercise.  Follow lipid panel and liver function tests.   

## 2021-09-04 NOTE — Assessment & Plan Note (Addendum)
Found on abdominal ultrasound.  Diet, exercise. and weight loss.  Follow liver function tests.  Diet and exercise.    

## 2021-09-04 NOTE — Assessment & Plan Note (Signed)
Overall doing better.  Follow.  

## 2021-09-04 NOTE — Assessment & Plan Note (Signed)
On metformin, jardiance and trulicity.  Discussed low carb diet and exercise.  Follow met b and a1c.

## 2021-09-04 NOTE — Assessment & Plan Note (Signed)
Doing well on adderall.  Focus is better.  Helping with temper.  Follow.

## 2021-09-14 NOTE — Chronic Care Management (AMB) (Signed)
  Care Management   Note  09/14/2021 Name: Francisco Jackson MRN: [id number] DOB: November 10, 1975  Francisco Jackson is a 45 y.o. year old male who is a primary care patient of Einar Pheasant, MD and is actively engaged with the care management team. I reached out to Celene Squibb by phone today to assist with re-scheduling a follow up visit with the RN Case Manager  Follow up plan: Unable to make contact on outreach attempts x 3. PCP Einar Pheasant, MD notified via routed documentation in medical record.   Noreene Larsson, [place], [place], [place] [address] Direct Dial: [phone] [email or web address] Website: [email or web address]

## 2021-10-29 DIAGNOSIS — M25561 Pain in right knee: Secondary | ICD-10-CM | POA: Diagnosis not present

## 2021-11-02 ENCOUNTER — Other Ambulatory Visit: Payer: Self-pay | Admitting: Internal Medicine

## 2021-11-03 ENCOUNTER — Other Ambulatory Visit: Payer: Self-pay | Admitting: Internal Medicine

## 2021-11-03 MED ORDER — AMPHETAMINE-DEXTROAMPHETAMINE 10 MG PO TABS: 10.0000 mg | ORAL_TABLET | Freq: Two times a day (BID) | ORAL | 0 refills | Status: DC

## 2021-11-03 NOTE — Telephone Encounter (Signed)
Rx sent in for adderall #60 with no refills.  He needs a f/u appt.  (Will need to be seen q 3 months for adderall rx).  Last seen 08/2021.

## 2021-11-04 NOTE — Telephone Encounter (Signed)
Rx already sent in.  Declined.

## 2021-11-08 ENCOUNTER — Other Ambulatory Visit: Payer: Self-pay | Admitting: Internal Medicine

## 2021-11-13 NOTE — Telephone Encounter (Signed)
Mychart sent to schedule appt

## 2021-11-26 ENCOUNTER — Encounter: Payer: Self-pay | Admitting: Internal Medicine

## 2021-11-26 ENCOUNTER — Telehealth (INDEPENDENT_AMBULATORY_CARE_PROVIDER_SITE_OTHER): Payer: 59 | Admitting: Internal Medicine

## 2021-11-26 DIAGNOSIS — K760 Fatty (change of) liver, not elsewhere classified: Secondary | ICD-10-CM

## 2021-11-26 DIAGNOSIS — R7989 Other specified abnormal findings of blood chemistry: Secondary | ICD-10-CM

## 2021-11-26 DIAGNOSIS — E782 Mixed hyperlipidemia: Secondary | ICD-10-CM | POA: Diagnosis not present

## 2021-11-26 DIAGNOSIS — F909 Attention-deficit hyperactivity disorder, unspecified type: Secondary | ICD-10-CM

## 2021-11-26 DIAGNOSIS — E1165 Type 2 diabetes mellitus with hyperglycemia: Secondary | ICD-10-CM | POA: Diagnosis not present

## 2021-11-26 DIAGNOSIS — F1029 Alcohol dependence with unspecified alcohol-induced disorder: Secondary | ICD-10-CM

## 2021-11-26 DIAGNOSIS — J4530 Mild persistent asthma, uncomplicated: Secondary | ICD-10-CM | POA: Diagnosis not present

## 2021-11-26 DIAGNOSIS — I10 Essential (primary) hypertension: Secondary | ICD-10-CM | POA: Diagnosis not present

## 2021-11-26 DIAGNOSIS — F439 Reaction to severe stress, unspecified: Secondary | ICD-10-CM

## 2021-11-26 DIAGNOSIS — R69 Illness, unspecified: Secondary | ICD-10-CM | POA: Diagnosis not present

## 2021-11-26 NOTE — Progress Notes (Signed)
Patient ID: Francisco Jackson, male   DOB: [birth year], 46 y.o.   MRN: [id number]   Virtual Visit via video Note  This visit type was conducted due to national recommendations for restrictions regarding the COVID-19 pandemic (e.g. social distancing).  This format is felt to be most appropriate for this patient at this time.  All issues noted in this document were discussed and addressed.  No physical exam was performed (except for noted visual exam findings with Video Visits).   I connected with Cordelia Pen by a video enabled telemedicine application and verified that I am speaking with the correct person using two identifiers. Location patient: home Location provider: work  Persons participating in the virtual visit: patient, provider  The limitations, risks, security and privacy concerns of performing an evaluation and management service by video and the availability of in person appointments have been discussed.  It has also been discussed with the patient that there may be a patient responsible charge related to this service. The patient expressed understanding and agreed to proceed.   Reason for visit: follow up appt  HPI: F/u regarding diabetes, hypertension and hypercholesterolemia.  Just returned from [place].  Feels he is doing well.  States he feels better than he has in a while.  No chest pain or sob reported.  No acid reflux reported.  Does report some nausea in am - prior to eating or drinking anything.  Occasional bowel issues.  Relates to the metformin.  Wants to adjust medication.  On trulicity.  Tolerating otherwise relatively well.  States am sugars are in 140s - when checks.  Has lost weight. Handling stress.  Adderall is working well for him.    ROS: See pertinent positives and negatives per HPI.  Past Medical History:  Diagnosis Date   Asthma    Atypical mole 04/05/2013   Left forearm. Atypical combined nevus, moderate atypia.   Diabetes mellitus without complication  ([place])    Dysplastic nevus 04/23/2014   Left prox. mid. tricep axilla. Mild atypia, margins free.   Hypertension    Personality disorder ([place])    borderline    Past Surgical History:  Procedure Laterality Date   ANTERIOR CRUCIATE LIGAMENT REPAIR Right 2006   CERVICAL [place] SURGERY  2010   COLONOSCOPY WITH PROPOFOL N/A 11/01/2019   Procedure: COLONOSCOPY WITH PROPOFOL;  Surgeon: Jonathon Bellows, MD;  [facility] ENDOSCOPY;  Service: Gastroenterology;  Laterality: N/A;   MENISCUS REPAIR Left 1996    Family History  Problem Relation Age of Onset   Diabetes Mother    Melanoma Mother    Diabetes Paternal Aunt    Heart attack Paternal Aunt    Diabetes Paternal Uncle    Diabetes Maternal Grandmother    Diabetes Maternal Grandfather    Heart disease Maternal Grandfather    Heart attack Maternal Grandfather    Heart attack Father    Hypertension Father    Diabetes Sister    Hypertension Sister    Kidney cancer Neg Hx    Kidney disease Neg Hx    Prostate cancer Neg Hx     SOCIAL HX: reviewed.    Current Outpatient Medications:    amLODipine (NORVASC) 10 MG tablet, TAKE 1 TABLET BY MOUTH ONCE DAILY, Disp: 90 tablet, Rfl: 1   amphetamine-dextroamphetamine (ADDERALL) 10 MG tablet, Take 1 tablet (10 mg total) by mouth 2 (two) times daily., Disp: 60 tablet, Rfl: 0   cholecalciferol (VITAMIN D3) 25 MCG (1000 UNIT) tablet, Take 1,000 Units by  mouth daily., Disp: , Rfl:    Continuous Blood Gluc Sensor (FREESTYLE LIBRE 2 SENSOR) MISC, Use to check glucose at least TID, Disp: 2 each, Rfl: 11   hydrochlorothiazide (MICROZIDE) 12.5 MG capsule, TAKE 1 CAPSULE BY MOUTH ONCE DAILY, Disp: 90 capsule, Rfl: 1   Insulin Pen Needle (PEN NEEDLES) 30G X 5 MM MISC, Inject 1 Applicatorful into the skin once a week. Use with trulicity pen, Disp: 277 each, Rfl: 2   JARDIANCE 10 MG TABS tablet, TAKE 1 TABLET BY MOUTH ONCE DAILY BEFOREBREAKFAST, Disp: 90 tablet, Rfl: 1   lisinopril (ZESTRIL) 5 MG tablet, TAKE  1 TABLET BY MOUTH DAILY, Disp: 90 tablet, Rfl: 1   metFORMIN (GLUCOPHAGE-XR) 500 MG 24 hr tablet, TAKE 2 TABLETS BY MOUTH 2 TIMES A DAY, Disp: 360 tablet, Rfl: 2   montelukast (SINGULAIR) 10 MG tablet, TAKE ONE TABLET EVERY MORNING, Disp: 30 tablet, Rfl: 0   Multiple Vitamin (MULTIVITAMIN) tablet, Take 1 tablet by mouth daily., Disp: , Rfl:    rosuvastatin (CRESTOR) 5 MG tablet, Take 1 tablet (5 mg total) by mouth daily., Disp: 90 tablet, Rfl: 3   sildenafil (REVATIO) 20 MG tablet, Take 2-4 tablets two hours before intercouse on an empty stomach.  Do not take with nitrates., Disp: 30 tablet, Rfl: 1   TRULICITY 1.5 OE/4.2PN SOPN, INJECT 1.5MG  SUBQ ONCE EVERY 7 DAYS, Disp: 2 mL, Rfl: 1  EXAM:  GENERAL: alert, oriented, appears well and in no acute distress  HEENT: atraumatic, conjunttiva clear, no obvious abnormalities on inspection of external nose and ears  NECK: normal movements of the head and neck  LUNGS: on inspection no signs of respiratory distress, breathing rate appears normal, no obvious gross SOB, gasping or wheezing  CV: no obvious cyanosis  PSYCH/NEURO: pleasant and cooperative, no obvious depression or anxiety, speech and thought processing grossly intact  ASSESSMENT AND PLAN:  Discussed the following assessment and plan:  Problem List Items Addressed This Visit     Abnormal liver function tests    Keep sugars under control.  Has adjusted diet.  Follow liver function tests.        ADHD    Doing well on adderall.  Focus is better.  Helping with temper.  Follow.       Asthma, mild persistent    Seeing Dr Donneta Romberg (last seen 01/15/21) - continue singulair, symbicort and astelin. Breathing stable.       Diabetes mellitus ([place])    On metformin, jardiance and trulicity.  Feels metformin aggravating his bowels.  Will decrease metformin to one tablet q day.  Continue trulicty and jardiance.  Discussed low carb diet and exercise.  Follow met b and a1c.         EtOH  dependence ([place])    Has cut down alcohol intake.  Follow.        Hepatic steatosis    Found on abdominal ultrasound.  Diet, exercise. and weight loss.  Follow liver function tests.  Diet and exercise.         Hypertension, essential    Continue hctz and amlodipine.  Blood pressure doing well.  Follow pressures.  Follow metabolic panel.       Mixed hyperlipidemia    On crestor.  Low cholesterol diet and exercise.  Follow lipid panel and liver function tests.        Stress    Overall appears to be handling things relatively well.  Feels adderall helps.  Does not feel needs  any further intervention.  Follow.         Return in about 3 months (around 02/23/2022) for physical.   I discussed the assessment and treatment plan with the patient. The patient was provided an opportunity to ask questions and all were answered. The patient agreed with the plan and demonstrated an understanding of the instructions.   The patient was advised to call back or seek an in-person evaluation if the symptoms worsen or if the condition fails to improve as anticipated.   Einar Pheasant, MD

## 2021-11-29 ENCOUNTER — Encounter: Payer: Self-pay | Admitting: Internal Medicine

## 2021-11-29 ENCOUNTER — Telehealth: Payer: Self-pay | Admitting: Internal Medicine

## 2021-11-29 DIAGNOSIS — R9389 Abnormal findings on diagnostic imaging of other specified body structures: Secondary | ICD-10-CM

## 2021-11-29 DIAGNOSIS — J4530 Mild persistent asthma, uncomplicated: Secondary | ICD-10-CM | POA: Insufficient documentation

## 2021-11-29 MED ORDER — AMPHETAMINE-DEXTROAMPHETAMINE 10 MG PO TABS: 10.0000 mg | ORAL_TABLET | Freq: Two times a day (BID) | ORAL | 0 refills | Status: DC

## 2021-11-29 NOTE — Assessment & Plan Note (Signed)
Overall appears to be handling things relatively well.  Feels adderall helps.  Does not feel needs any further intervention.  Follow.

## 2021-11-29 NOTE — Assessment & Plan Note (Signed)
Has cut down alcohol intake.  Follow.   

## 2021-11-29 NOTE — Telephone Encounter (Signed)
Needs 3 month f/u scheduled.  Schedule fasting labs within the next 1-2 weeks.  Also, see if he would be agreeable for cxr - to confirm clear - when comes in for labs.  Also, he had mentioned being interested in new [place] sensor.  Did we need to do something to help get this arranged?

## 2021-11-29 NOTE — Assessment & Plan Note (Signed)
Keep sugars under control.  Has adjusted diet.  Follow liver function tests.

## 2021-11-29 NOTE — Assessment & Plan Note (Signed)
Found on abdominal ultrasound.  Diet, exercise. and weight loss.  Follow liver function tests.  Diet and exercise.    

## 2021-11-29 NOTE — Assessment & Plan Note (Signed)
Continue hctz and amlodipine.  Blood pressure doing well.  Follow pressures.  Follow metabolic panel.  

## 2021-11-29 NOTE — Assessment & Plan Note (Signed)
Seeing Dr Donneta Romberg (last seen 01/15/21) - continue singulair, symbicort and astelin. Breathing stable.

## 2021-11-29 NOTE — Assessment & Plan Note (Signed)
On crestor.  Low cholesterol diet and exercise.  Follow lipid panel and liver function tests.   

## 2021-11-29 NOTE — Assessment & Plan Note (Signed)
On metformin, jardiance and trulicity.  Feels metformin aggravating his bowels.  Will decrease metformin to one tablet q day.  Continue trulicty and jardiance.  Discussed low carb diet and exercise.  Follow met b and a1c.    °

## 2021-11-29 NOTE — Assessment & Plan Note (Signed)
Doing well on adderall.  Focus is better.  Helping with temper.  Follow.

## 2021-11-30 ENCOUNTER — Other Ambulatory Visit: Payer: Self-pay

## 2021-11-30 MED ORDER — FREESTYLE LIBRE 3 SENSOR MISC: 1.0000 "application " | 11 refills | Status: DC

## 2021-12-03 NOTE — Addendum Note (Signed)
Addended by: Lars Masson on: 12/03/2021 02:21 PM   Modules accepted: Orders

## 2021-12-03 NOTE — Telephone Encounter (Signed)
Cxr ordered. Labs already in

## 2021-12-03 NOTE — Telephone Encounter (Signed)
Please schedule fasting labs and chest xray (just for her to check and make sure is clear) in 1-2 weeks and then 3 month follow up with Dr Nicki Reaper. I will order the chest x-ray once he is scheduled and I have sent in his [place] 3 sensors to [facility]. Let me know if he has any questions

## 2021-12-03 NOTE — Telephone Encounter (Signed)
Patient called and labs/xray scheduled for 12/17/2021, please put orders in. 35m follow up scheduled for May.

## 2021-12-04 ENCOUNTER — Other Ambulatory Visit: Payer: Self-pay | Admitting: Internal Medicine

## 2021-12-15 ENCOUNTER — Other Ambulatory Visit: Payer: 59

## 2021-12-17 ENCOUNTER — Ambulatory Visit (INDEPENDENT_AMBULATORY_CARE_PROVIDER_SITE_OTHER): Payer: 59

## 2021-12-17 ENCOUNTER — Other Ambulatory Visit: Payer: Self-pay

## 2021-12-17 ENCOUNTER — Other Ambulatory Visit (INDEPENDENT_AMBULATORY_CARE_PROVIDER_SITE_OTHER): Payer: 59

## 2021-12-17 DIAGNOSIS — Z125 Encounter for screening for malignant neoplasm of prostate: Secondary | ICD-10-CM

## 2021-12-17 DIAGNOSIS — E782 Mixed hyperlipidemia: Secondary | ICD-10-CM | POA: Diagnosis not present

## 2021-12-17 DIAGNOSIS — R918 Other nonspecific abnormal finding of lung field: Secondary | ICD-10-CM | POA: Diagnosis not present

## 2021-12-17 DIAGNOSIS — I10 Essential (primary) hypertension: Secondary | ICD-10-CM | POA: Diagnosis not present

## 2021-12-17 DIAGNOSIS — E1165 Type 2 diabetes mellitus with hyperglycemia: Secondary | ICD-10-CM

## 2021-12-17 DIAGNOSIS — R9389 Abnormal findings on diagnostic imaging of other specified body structures: Secondary | ICD-10-CM

## 2021-12-17 DIAGNOSIS — K760 Fatty (change of) liver, not elsewhere classified: Secondary | ICD-10-CM | POA: Diagnosis not present

## 2021-12-17 LAB — MICROALBUMIN / CREATININE URINE RATIO
Creatinine,U: 86.1 mg/dL
Microalb Creat Ratio: 0.8 mg/g (ref 0.0–30.0)
Microalb, Ur: <0.7 mg/dL (ref 0.0–1.9)

## 2021-12-17 LAB — BASIC METABOLIC PANEL
BUN: 17 mg/dL (ref 6–23)
CO2: 27 mEq/L (ref 19–32)
Calcium: 9.7 mg/dL (ref 8.4–10.5)
Chloride: 99 mEq/L (ref 96–112)
Creatinine, Ser: 1.09 mg/dL (ref 0.40–1.50)
GFR: 80.84 mL/min (ref >60.00)
Glucose, Bld: 107 mg/dL — ABNORMAL HIGH (ref 70–99)
Potassium: 4.2 mEq/L (ref 3.5–5.1)
Sodium: 136 mEq/L (ref 135–145)

## 2021-12-17 LAB — CBC WITH DIFFERENTIAL/PLATELET
Basophils Absolute: 0.1 10*3/uL (ref 0.0–0.1)
Basophils Relative: 1.1 % (ref 0.0–3.0)
Eosinophils Absolute: 0.5 10*3/uL (ref 0.0–0.7)
Eosinophils Relative: 6.3 % — ABNORMAL HIGH (ref 0.0–5.0)
HCT: 51.8 % (ref 39.0–52.0)
Hemoglobin: 17.5 g/dL — ABNORMAL HIGH (ref 13.0–17.0)
Lymphocytes Relative: 31 % (ref 12.0–46.0)
Lymphs Abs: 2.5 10*3/uL (ref 0.7–4.0)
MCHC: 33.7 g/dL (ref 30.0–36.0)
MCV: 89.2 fl (ref 78.0–100.0)
Monocytes Absolute: 0.7 10*3/uL (ref 0.1–1.0)
Monocytes Relative: 8 % (ref 3.0–12.0)
Neutro Abs: 4.4 10*3/uL (ref 1.4–7.7)
Neutrophils Relative %: 53.6 % (ref 43.0–77.0)
Platelets: 285 10*3/uL (ref 150.0–400.0)
RBC: 5.81 Mil/uL (ref 4.22–5.81)
RDW: 13.4 % (ref 11.5–15.5)
WBC: 8.2 10*3/uL (ref 4.0–10.5)

## 2021-12-17 LAB — HEPATIC FUNCTION PANEL
ALT: 40 U/L (ref 0–53)
AST: 24 U/L (ref 0–37)
Albumin: 4.7 g/dL (ref 3.5–5.2)
Alkaline Phosphatase: 61 U/L (ref 39–117)
Bilirubin, Direct: 0.1 mg/dL (ref 0.0–0.3)
Total Bilirubin: 0.8 mg/dL (ref 0.2–1.2)
Total Protein: 7.2 g/dL (ref 6.0–8.3)

## 2021-12-17 LAB — LIPID PANEL
Cholesterol: 129 mg/dL (ref 0–200)
HDL: 44.8 mg/dL (ref >39.00)
LDL Cholesterol: 61 mg/dL (ref 0–99)
NonHDL: 84.06
Total CHOL/HDL Ratio: 3
Triglycerides: 115 mg/dL (ref 0.0–149.0)
VLDL: 23 mg/dL (ref 0.0–40.0)

## 2021-12-17 LAB — PSA: PSA: 0.21 ng/mL (ref 0.10–4.00)

## 2021-12-17 LAB — HEMOGLOBIN A1C: Hgb A1c MFr Bld: 6.7 % — ABNORMAL HIGH (ref 4.6–6.5)

## 2022-01-01 ENCOUNTER — Other Ambulatory Visit: Payer: Self-pay | Admitting: Internal Medicine

## 2022-01-25 ENCOUNTER — Other Ambulatory Visit: Payer: Self-pay | Admitting: Internal Medicine

## 2022-01-26 DIAGNOSIS — J3081 Allergic rhinitis due to animal (cat) (dog) hair and dander: Secondary | ICD-10-CM | POA: Diagnosis not present

## 2022-01-26 DIAGNOSIS — J4530 Mild persistent asthma, uncomplicated: Secondary | ICD-10-CM | POA: Diagnosis not present

## 2022-01-26 DIAGNOSIS — J3089 Other allergic rhinitis: Secondary | ICD-10-CM | POA: Diagnosis not present

## 2022-01-26 DIAGNOSIS — H1045 Other chronic allergic conjunctivitis: Secondary | ICD-10-CM | POA: Diagnosis not present

## 2022-02-01 ENCOUNTER — Other Ambulatory Visit: Payer: Self-pay | Admitting: Internal Medicine

## 2022-02-03 NOTE — Telephone Encounter (Signed)
Rx ok'd for adderall #60 with no refills.  ?

## 2022-03-01 ENCOUNTER — Other Ambulatory Visit: Payer: Self-pay | Admitting: Internal Medicine

## 2022-03-01 ENCOUNTER — Telehealth: Payer: Self-pay | Admitting: Internal Medicine

## 2022-03-01 NOTE — Telephone Encounter (Signed)
Patient was wondering if Dr Nicki Reaper could refill his Adderall before 7:30am on 03/03/2022. His family vacation has already started and he is leaving to join family after seeing Dr Nicki Reaper.

## 2022-03-01 NOTE — Telephone Encounter (Signed)
Refilled: 02/03/2022 Last OV: 11/26/2021 Next OV: 03/03/2022

## 2022-03-02 MED ORDER — AMPHETAMINE-DEXTROAMPHETAMINE 10 MG PO TABS: 10.0000 mg | ORAL_TABLET | Freq: Two times a day (BID) | ORAL | 0 refills | Status: DC

## 2022-03-02 NOTE — Telephone Encounter (Signed)
Rx ok'd for adderall #60 with no refills.  Keep scheduled appt

## 2022-03-03 ENCOUNTER — Ambulatory Visit (INDEPENDENT_AMBULATORY_CARE_PROVIDER_SITE_OTHER): Payer: 59 | Admitting: Internal Medicine

## 2022-03-03 ENCOUNTER — Encounter: Payer: Self-pay | Admitting: Internal Medicine

## 2022-03-03 VITALS — BP 128/70 | HR 86 | Temp 98.3°F | Resp 21 | Ht 71.0 in | Wt 228.2 lb

## 2022-03-03 DIAGNOSIS — E782 Mixed hyperlipidemia: Secondary | ICD-10-CM | POA: Diagnosis not present

## 2022-03-03 DIAGNOSIS — F909 Attention-deficit hyperactivity disorder, unspecified type: Secondary | ICD-10-CM

## 2022-03-03 DIAGNOSIS — J4530 Mild persistent asthma, uncomplicated: Secondary | ICD-10-CM

## 2022-03-03 DIAGNOSIS — R69 Illness, unspecified: Secondary | ICD-10-CM | POA: Diagnosis not present

## 2022-03-03 DIAGNOSIS — K760 Fatty (change of) liver, not elsewhere classified: Secondary | ICD-10-CM | POA: Diagnosis not present

## 2022-03-03 DIAGNOSIS — Z0000 Encounter for general adult medical examination without abnormal findings: Secondary | ICD-10-CM

## 2022-03-03 DIAGNOSIS — F1029 Alcohol dependence with unspecified alcohol-induced disorder: Secondary | ICD-10-CM

## 2022-03-03 DIAGNOSIS — I10 Essential (primary) hypertension: Secondary | ICD-10-CM

## 2022-03-03 DIAGNOSIS — E1165 Type 2 diabetes mellitus with hyperglycemia: Secondary | ICD-10-CM

## 2022-03-03 MED ORDER — AMPHETAMINE-DEXTROAMPHET ER 10 MG PO CP24: 10.0000 mg | ORAL_CAPSULE | Freq: Every day | ORAL | 0 refills | Status: DC

## 2022-03-03 MED ORDER — AMPHETAMINE-DEXTROAMPHETAMINE 10 MG PO TABS: ORAL_TABLET | ORAL | 0 refills | Status: DC

## 2022-03-03 NOTE — Assessment & Plan Note (Addendum)
Physical today 03/03/22.  Colonoscopy 10/2019 - descending colon polyp (tubular adenoma) - Dr Vicente Males - recommended f/u in 7 years.  PSA 12/17/21 - .21

## 2022-03-03 NOTE — Progress Notes (Signed)
Patient ID: Francisco Jackson, male   DOB: 01-25-1976, 46 y.o.   MRN: [id number]   Subjective:    Patient ID: Francisco Jackson, male    DOB: 05/05/1976, 46 y.o.   MRN: [id number]   Patient here for his physical exam.   Chief Complaint  Patient presents with   Annual Exam    CPE   .   HPI Last visit, decreased metformin one tablet bid.   Continues on trulicity and jardiance.  States he is doing well.  Not checking sugars.  Discussed CGM.  Agreeable. No chest pain or sob reported.  Feels better than he has in a long time.  No acid reflux.  No abdominal pain.  Bowels moving.  Blood pressure doing well. Handling stress.  Doing well with adderall.    Past Medical History:  Diagnosis Date   Asthma    Atypical mole 04/05/2013   Left forearm. Atypical combined nevus, moderate atypia.   Diabetes mellitus without complication ([place])    Dysplastic nevus 04/23/2014   Left prox. mid. tricep axilla. Mild atypia, margins free.   Hypertension    Personality disorder ([place])    borderline   Past Surgical History:  Procedure Laterality Date   ANTERIOR CRUCIATE LIGAMENT REPAIR Right 2006   CERVICAL [place] SURGERY  2010   COLONOSCOPY WITH PROPOFOL N/A 11/01/2019   Procedure: COLONOSCOPY WITH PROPOFOL;  Surgeon: Jonathon Bellows, MD;  Location: [facility] ENDOSCOPY;  Service: Gastroenterology;  Laterality: N/A;   MENISCUS REPAIR Left 1996   Family History  Problem Relation Age of Onset   Diabetes Mother    Melanoma Mother    Diabetes Paternal Aunt    Heart attack Paternal Aunt    Diabetes Paternal Uncle    Diabetes Maternal Grandmother    Diabetes Maternal Grandfather    Heart disease Maternal Grandfather    Heart attack Maternal Grandfather    Heart attack Father    Hypertension Father    Diabetes Sister    Hypertension Sister    Kidney cancer Neg Hx    Kidney disease Neg Hx    Prostate cancer Neg Hx    Social History   Socioeconomic History   Marital status: Married    Spouse name: Nevin Bloodgood    Number of children: Not on file   Years of education: Not on file   Highest education level: Not on file  Occupational History   Occupation: plumber    Comment: owner of plumbing company   Tobacco Use   Smoking status: Former    Packs/day: 0.50    Years: 10.00    Pack years: 5.00    Types: Cigarettes    Quit date: 10/11/2002    Years since quitting: 19.4   Smokeless tobacco: Former    Types: Snuff    Quit date: 10/11/2009  Vaping Use   Vaping Use: Never used  Substance and Sexual Activity   Alcohol use: Yes    Alcohol/week: 4.0 standard drinks    Types: 4 Glasses of wine per week    Comment: ocassional   Drug use: No   Sexual activity: Not on file  Other Topics Concern   Not on file  Social History Narrative   Not on file   Social Determinants of Health   Financial Resource Strain: Low Risk    Difficulty of Paying Living Expenses: Not hard at all  Food Insecurity: No Food Insecurity   Worried About [place] in the Last Year: Never true  Ran Out of Food in the Last Year: Never true  Transportation Needs: No Transportation Needs   Lack of Transportation (Medical): No   Lack of Transportation (Non-Medical): No  Physical Activity: Not on file  Stress: No Stress Concern Present   Feeling of Stress : Not at all  Social Connections: Not on file     Review of Systems  Constitutional:  Negative for appetite change and unexpected weight change.  HENT:  Negative for congestion, sinus pressure and sore throat.   Eyes:  Negative for pain and visual disturbance.  Respiratory:  Negative for cough, chest tightness and shortness of breath.   Cardiovascular:  Negative for chest pain and palpitations.  Gastrointestinal:  Negative for abdominal pain, diarrhea, nausea and vomiting.  Genitourinary:  Negative for difficulty urinating and dysuria.  Musculoskeletal:  Negative for joint swelling and myalgias.  Skin:  Negative for color change and rash.  Neurological:  Negative  for dizziness, light-headedness and headaches.  Hematological:  Negative for adenopathy. Does not bruise/bleed easily.  Psychiatric/Behavioral:  Negative for agitation and dysphoric mood.       Objective:     BP 128/70 (BP Location: Left Arm, Patient Position: Sitting, Cuff Size: Large)   Pulse 86   Temp 98.3 F (36.8 C) (Temporal)   Resp (!) 21   Ht 5' 11"  (1.803 m)   Wt 228 lb 3.2 oz (103.5 kg)   SpO2 98%   BMI 31.83 kg/m  Wt Readings from Last 3 Encounters:  03/03/22 228 lb 3.2 oz (103.5 kg)  11/26/21 229 lb (103.9 kg)  04/01/21 236 lb 12.8 oz (107.4 kg)    Physical Exam Constitutional:      General: He is not in acute distress.    Appearance: Normal appearance. He is well-developed.  HENT:     Head: Normocephalic and atraumatic.     Right Ear: External ear normal.     Left Ear: External ear normal.  Eyes:     General: No scleral icterus.       Right eye: No discharge.        Left eye: No discharge.     Conjunctiva/sclera: Conjunctivae normal.  Neck:     Thyroid: No thyromegaly.  Cardiovascular:     Rate and Rhythm: Normal rate and regular rhythm.  Pulmonary:     Effort: No respiratory distress.     Breath sounds: Normal breath sounds. No wheezing.  Abdominal:     General: Bowel sounds are normal.     Palpations: Abdomen is soft.     Tenderness: There is no abdominal tenderness.  Musculoskeletal:        General: No swelling or tenderness.     Cervical back: Neck supple. No tenderness.  Lymphadenopathy:     Cervical: No cervical adenopathy.  Skin:    Findings: No erythema or rash.  Neurological:     Mental Status: He is alert and oriented to person, place, and time.  Psychiatric:        Mood and Affect: Mood normal.        Behavior: Behavior normal.     Outpatient Encounter Medications as of 03/03/2022  Medication Sig   amLODipine (NORVASC) 10 MG tablet TAKE 1 TABLET BY MOUTH ONCE DAILY   amphetamine-dextroamphetamine (ADDERALL XR) 10 MG 24 hr  capsule Take 1 capsule (10 mg total) by mouth daily. Take in am   amphetamine-dextroamphetamine (ADDERALL) 10 MG tablet Take one tablet in the pm   cholecalciferol (VITAMIN D3) 25  MCG (1000 UNIT) tablet Take 1,000 Units by mouth daily.   Continuous Blood Gluc Sensor (FREESTYLE LIBRE 3 SENSOR) MISC 1 application by Does not apply route every 14 (fourteen) days. Place 1 sensor on the skin every 14 days. Use to check glucose continuously   hydrochlorothiazide (MICROZIDE) 12.5 MG capsule TAKE 1 CAPSULE BY MOUTH ONCE DAILY   Insulin Pen Needle (PEN NEEDLES) 30G X 5 MM MISC Inject 1 Applicatorful into the skin once a week. Use with trulicity pen   JARDIANCE 10 MG TABS tablet TAKE 1 TABLET BY MOUTH ONCE DAILY BEFOREBREAKFAST   lisinopril (ZESTRIL) 5 MG tablet TAKE 1 TABLET BY MOUTH DAILY   montelukast (SINGULAIR) 10 MG tablet TAKE ONE TABLET EVERY MORNING   Multiple Vitamin (MULTIVITAMIN) tablet Take 1 tablet by mouth daily.   rosuvastatin (CRESTOR) 5 MG tablet TAKE 1 TABLET BY MOUTH ONCE DAILY   sertraline (ZOLOFT) 100 MG tablet TAKE 1 TABLET BY MOUTH ONCE DAILY   sildenafil (REVATIO) 20 MG tablet Take 2-4 tablets two hours before intercouse on an empty stomach.  Do not take with nitrates.   TRULICITY 1.5 NI/7.7OE SOPN INJECT 1.5MG  SUBQ ONCE EVERY 7 DAYS   [DISCONTINUED] amphetamine-dextroamphetamine (ADDERALL) 10 MG tablet Take 1 tablet (10 mg total) by mouth 2 (two) times daily.   [DISCONTINUED] metFORMIN (GLUCOPHAGE-XR) 500 MG 24 hr tablet TAKE 2 TABLETS BY MOUTH 2 TIMES A DAY (Patient taking differently: One tablet BID)   metFORMIN (GLUCOPHAGE-XR) 500 MG 24 hr tablet One tablet BID   No facility-administered encounter medications on file as of 03/03/2022.     Lab Results  Component Value Date   WBC 8.2 12/17/2021   HGB 17.5 (H) 12/17/2021   HCT 51.8 12/17/2021   PLT 285.0 12/17/2021   GLUCOSE 107 (H) 12/17/2021   CHOL 129 12/17/2021   TRIG 115.0 12/17/2021   HDL 44.80 12/17/2021    LDLCALC 61 12/17/2021   ALT 40 12/17/2021   AST 24 12/17/2021   NA 136 12/17/2021   K 4.2 12/17/2021   CL 99 12/17/2021   CREATININE 1.09 12/17/2021   BUN 17 12/17/2021   CO2 27 12/17/2021   TSH 1.85 04/01/2021   PSA 0.21 12/17/2021   HGBA1C 6.7 (H) 12/17/2021   MICROALBUR <0.7 12/17/2021    MR 3D Recon At Scanner  Result Date: 07/20/2020 CLINICAL DATA:  Epigastric pain for 1 month. History of pancreatitis. History of ethanol abuse. EXAM: MRI ABDOMEN WITHOUT AND WITH CONTRAST (INCLUDING MRCP) TECHNIQUE: Multiplanar multisequence MR imaging of the abdomen was performed both before and after the administration of intravenous contrast. Heavily T2-weighted images of the biliary and pancreatic ducts were obtained, and three-dimensional MRCP images were rendered by post processing. CONTRAST:  73m GADAVIST GADOBUTROL 1 MMOL/ML IV SOLN field COMPARISON:  Abdominal ultrasound 04/30/2020.  CT 05/02/2020. FINDINGS: Lower chest: Normal heart size without pericardial or pleural effusion. Hepatobiliary: Mild hepatomegaly and marked hepatic steatosis. 18.9 cm craniocaudal. No focal liver lesion. Normal gallbladder. No intra or extrahepatic biliary duct dilatation. No choledocholithiasis. Pancreas: No evidence of acute pancreatitis. No pancreatic duct dilatation. No pancreas divisum. Pancreas enhances normally. Spleen:  Normal in size, without focal abnormality. Adrenals/Urinary Tract: Normal adrenal glands. Normal kidneys, without hydronephrosis. Stomach/Bowel: Tiny hiatal hernia.  Normal abdominal bowel loops. Vascular/Lymphatic: Aortic atherosclerosis. No abdominal adenopathy. Other:  No ascites. Musculoskeletal: No acute osseous abnormality. IMPRESSION: 1. No evidence of acute pancreatitis or other acute abdominal process. No biliary duct dilatation or choledocholithiasis. 2. Hepatic steatosis and mild hepatomegaly.  3. Tiny hiatal hernia. Electronically Signed   By: Abigail Miyamoto M.D.   On: 07/20/2020 11:27    MR ABDOMEN MRCP W WO CONTAST  Result Date: 07/20/2020 CLINICAL DATA:  Epigastric pain for 1 month. History of pancreatitis. History of ethanol abuse. EXAM: MRI ABDOMEN WITHOUT AND WITH CONTRAST (INCLUDING MRCP) TECHNIQUE: Multiplanar multisequence MR imaging of the abdomen was performed both before and after the administration of intravenous contrast. Heavily T2-weighted images of the biliary and pancreatic ducts were obtained, and three-dimensional MRCP images were rendered by post processing. CONTRAST:  42m GADAVIST GADOBUTROL 1 MMOL/ML IV SOLN field COMPARISON:  Abdominal ultrasound 04/30/2020.  CT 05/02/2020. FINDINGS: Lower chest: Normal heart size without pericardial or pleural effusion. Hepatobiliary: Mild hepatomegaly and marked hepatic steatosis. 18.9 cm craniocaudal. No focal liver lesion. Normal gallbladder. No intra or extrahepatic biliary duct dilatation. No choledocholithiasis. Pancreas: No evidence of acute pancreatitis. No pancreatic duct dilatation. No pancreas divisum. Pancreas enhances normally. Spleen:  Normal in size, without focal abnormality. Adrenals/Urinary Tract: Normal adrenal glands. Normal kidneys, without hydronephrosis. Stomach/Bowel: Tiny hiatal hernia.  Normal abdominal bowel loops. Vascular/Lymphatic: Aortic atherosclerosis. No abdominal adenopathy. Other:  No ascites. Musculoskeletal: No acute osseous abnormality. IMPRESSION: 1. No evidence of acute pancreatitis or other acute abdominal process. No biliary duct dilatation or choledocholithiasis. 2. Hepatic steatosis and mild hepatomegaly. 3. Tiny hiatal hernia. Electronically Signed   By: KAbigail MiyamotoM.D.   On: 07/20/2020 11:27       Assessment & Plan:   Problem List Items Addressed This Visit     ADHD    Doing well on adderall.  Focus is better.  Helping with temper.  Follow.        Asthma, mild persistent    Has seen Dr SDonneta Romberg- continue singulair, symbicort and astelin. Breathing stable.        Diabetes  [place]itus [place]    On metformin, jardiance and trulicity. Doing better since decreasing dose of metformin.  Discussed low carb diet and exercise.  Follow met b and a1c.  Agreeable to CGM.        Relevant Medications   metFORMIN (GLUCOPHAGE-XR) 500 MG 24 hr tablet   Other Relevant Orders   Hemoglobin A1c   EtOH dependence ([place]    Has cut down alcohol intake.  Follow.         Healthcare maintenance    Physical today 03/03/22.  Colonoscopy 10/2019 - descending colon polyp (tubular adenoma) - Dr AVicente Males- recommended f/u in 7 years.  PSA 12/17/21 - .21       Hepatic steatosis    Found on abdominal ultrasound.  Diet, exercise. and weight loss.  Follow liver function tests.  Diet and exercise.          Relevant Orders   Hepatic function panel   Hypertension, essential    Continue hctz and amlodipine.  Blood pressure doing well.  Follow pressures.  Follow metabolic panel.        Relevant Orders   Basic metabolic panel   Mixed hyperlipidemia    On crestor.  Low cholesterol diet and exercise.  Follow lipid panel and liver function tests.         Relevant Orders   CBC with Differential/Platelet   TSH   Lipid panel   Other Visit Diagnoses     Routine general medical examination at a health care facility    -  Primary        CEinar Pheasant MD

## 2022-03-08 ENCOUNTER — Other Ambulatory Visit: Payer: Self-pay | Admitting: Internal Medicine

## 2022-03-08 ENCOUNTER — Other Ambulatory Visit: Payer: Self-pay | Admitting: Family

## 2022-03-09 ENCOUNTER — Encounter: Payer: Self-pay | Admitting: Internal Medicine

## 2022-03-09 MED ORDER — METFORMIN HCL ER 500 MG PO TB24: ORAL_TABLET | ORAL | 1 refills | Status: DC

## 2022-03-09 NOTE — Assessment & Plan Note (Signed)
Continue hctz and amlodipine.  Blood pressure doing well.  Follow pressures.  Follow metabolic panel.  

## 2022-03-09 NOTE — Assessment & Plan Note (Signed)
Found on abdominal ultrasound.  Diet, exercise. and weight loss.  Follow liver function tests.  Diet and exercise.    

## 2022-03-09 NOTE — Assessment & Plan Note (Signed)
Doing well on adderall.  Focus is better.  Helping with temper.  Follow.

## 2022-03-09 NOTE — Assessment & Plan Note (Signed)
Has seen Dr Donneta Romberg - continue singulair, symbicort and astelin. Breathing stable.

## 2022-03-09 NOTE — Assessment & Plan Note (Signed)
On metformin, jardiance and trulicity. Doing better since decreasing dose of metformin.  Discussed low carb diet and exercise.  Follow met b and a1c.  Agreeable to CGM.

## 2022-03-09 NOTE — Assessment & Plan Note (Signed)
On crestor.  Low cholesterol diet and exercise.  Follow lipid panel and liver function tests.   

## 2022-03-09 NOTE — Assessment & Plan Note (Signed)
Has cut down alcohol intake.  Follow.   

## 2022-03-20 IMAGING — DX DG ABDOMEN 1V
2 series · 2 of 2 positions shown · non-contrast
Comparison: None.

CLINICAL DATA: Bilateral flank pain

EXAM:
ABDOMEN - 1 VIEW

[abdomen standing ap (1 of 2)]
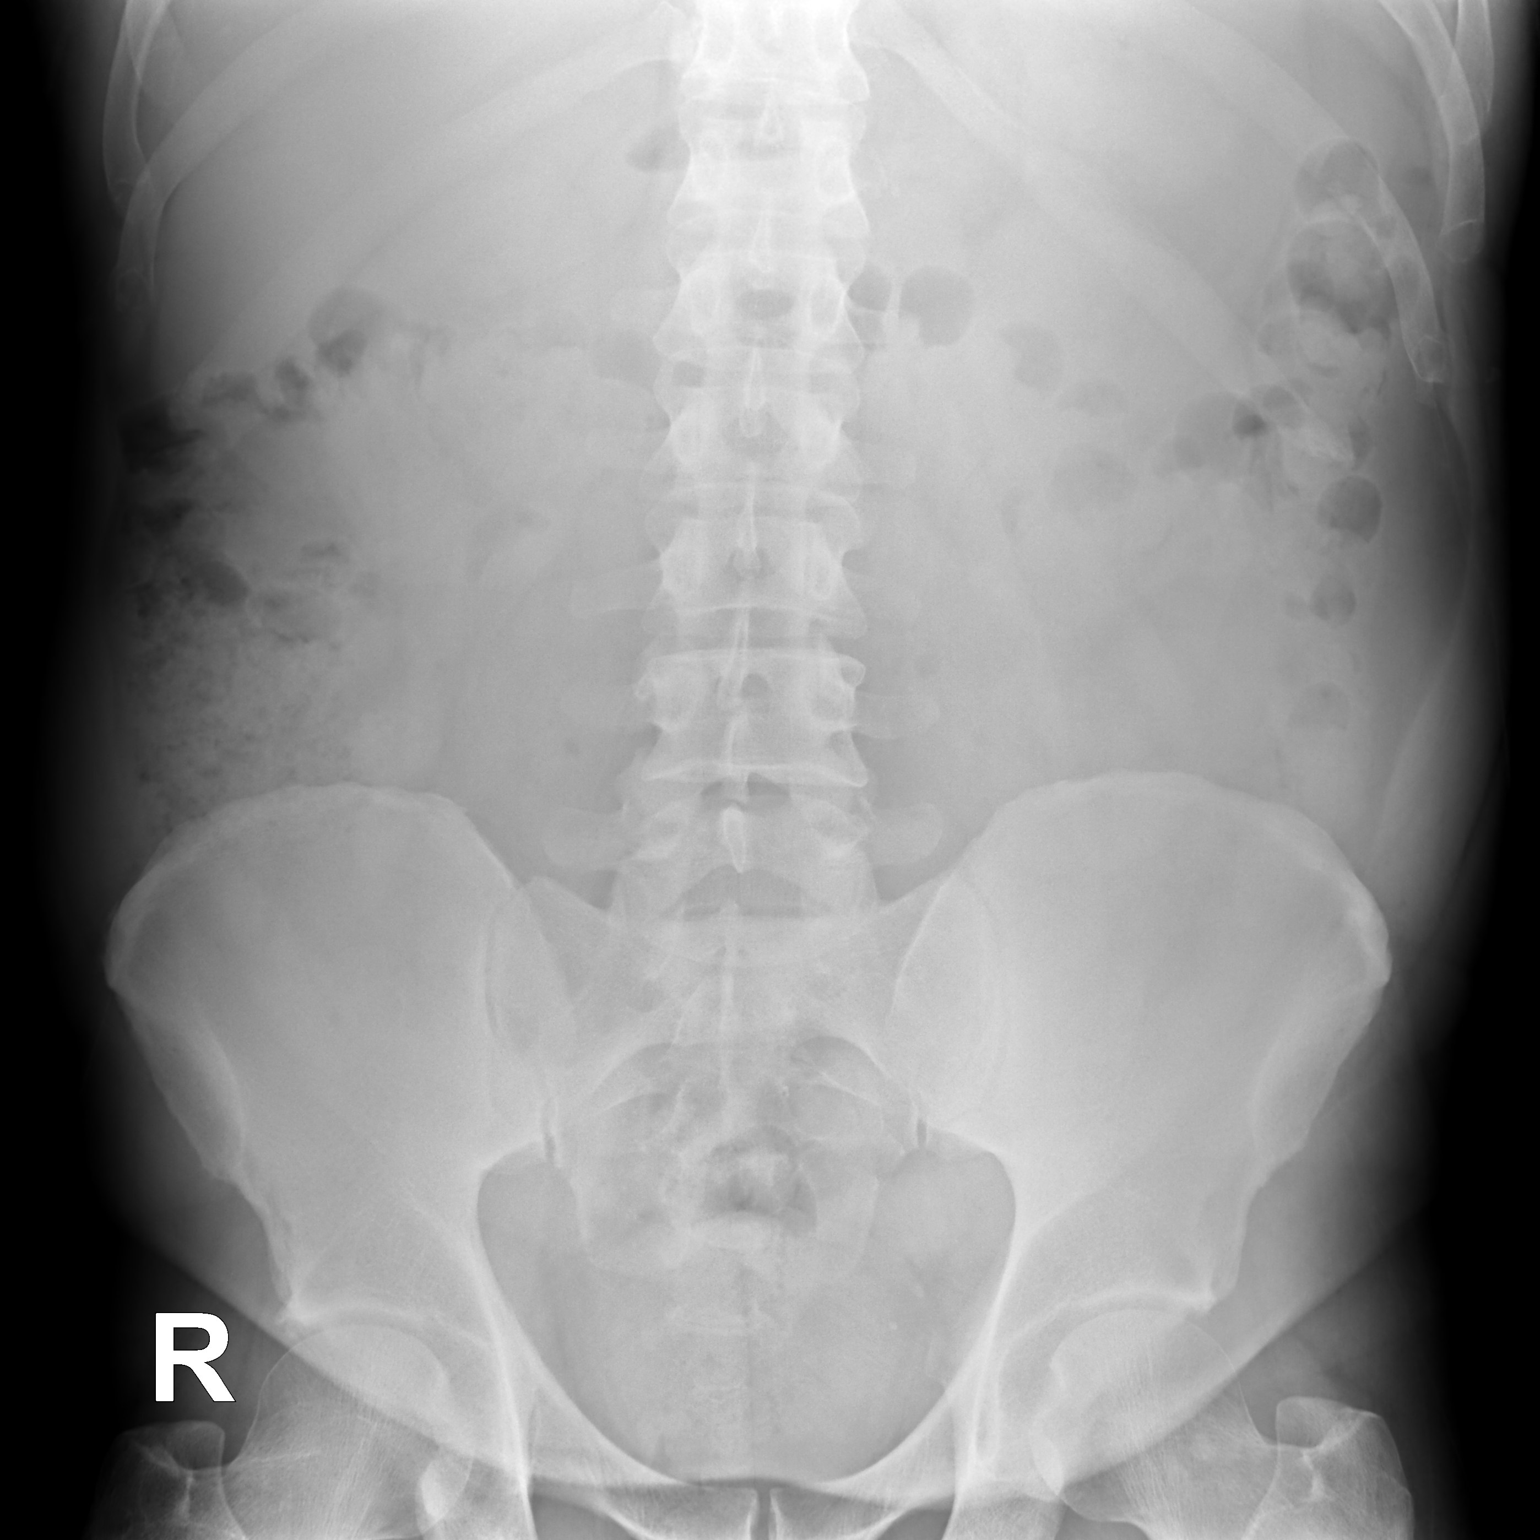

[abdomen standing ap (2 of 2)]
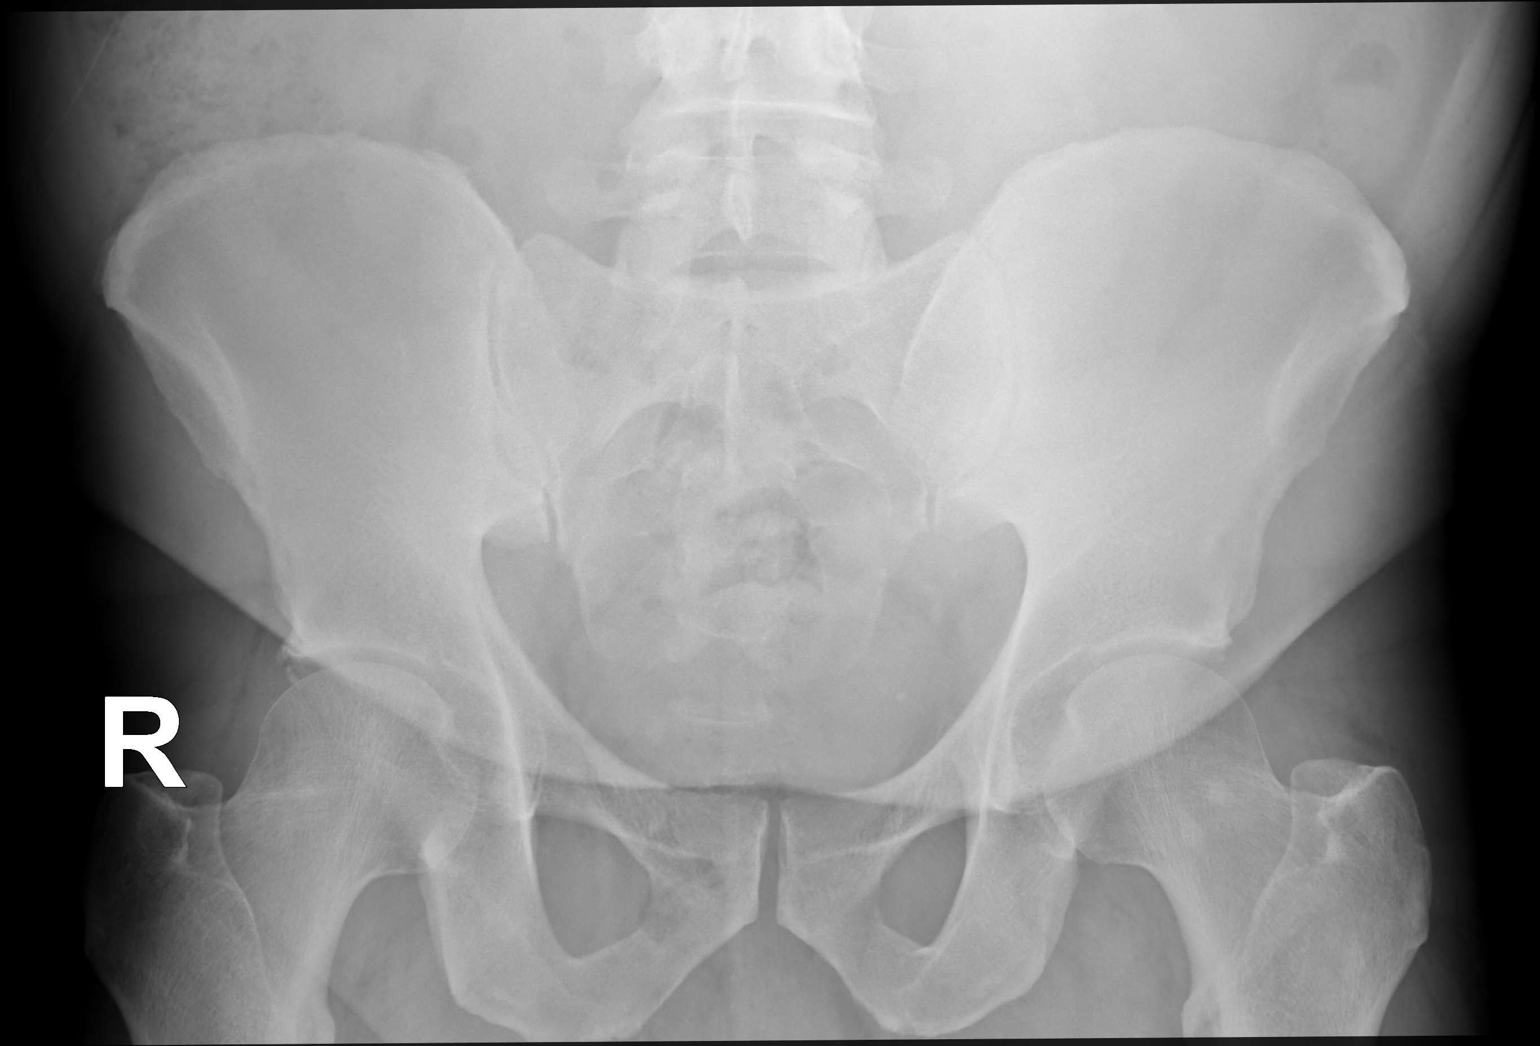

[2 of 2 positions shown; findings below may reference images not displayed]

FINDINGS: The bowel gas pattern is normal. Moderate stool in the colon. No
radiopaque calculi over the kidneys. Punctate calcifications in the
left pelvis indeterminate for phleboliths or tiny urinary tract
stones.
IMPRESSION: Nonobstructed gas pattern with moderate stool in the colon. Punctate
left pelvic calcifications indeterminate for phleboliths versus tiny
urinary tract stones.

## 2022-03-22 IMAGING — US US ABDOMEN LIMITED
1 series · 14 of 25 positions shown · non-contrast
Comparison: 05/02/2020

CLINICAL DATA: Pancreatitis, right flank pain

EXAM:
ULTRASOUND ABDOMEN LIMITED RIGHT UPPER QUADRANT

[Series 1: us abdomen limited ruq · 14 of 48 slices shown]
[im 1/48]
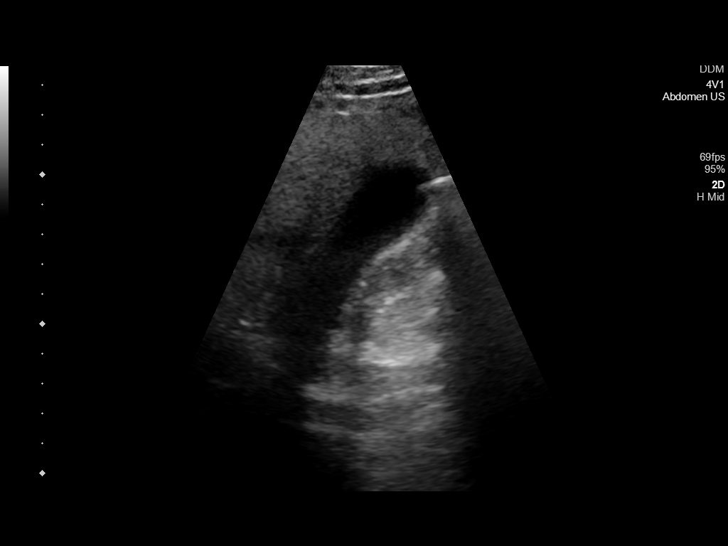
[im 4/48]
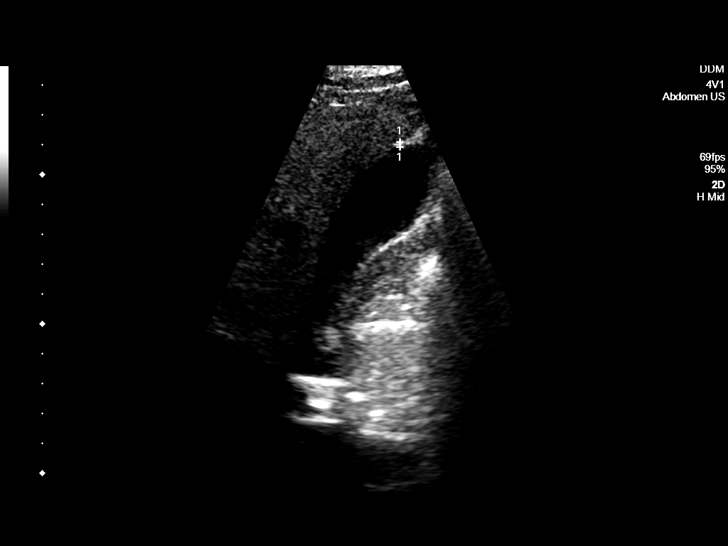
[im 8/48]
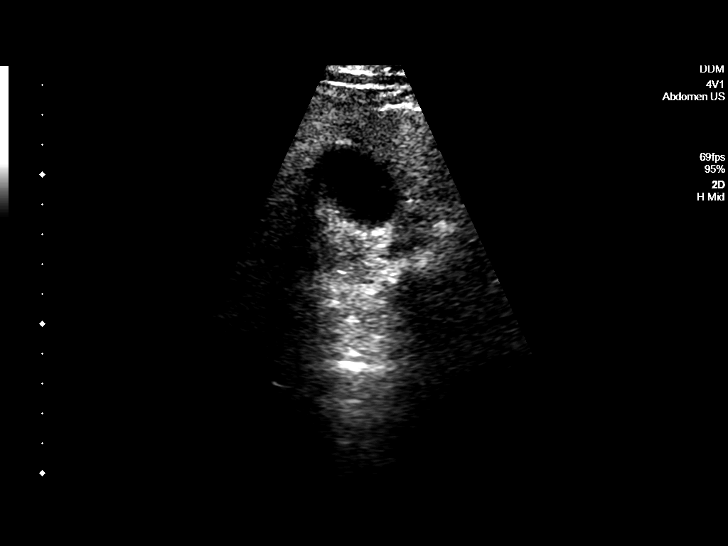
[im 12/48]
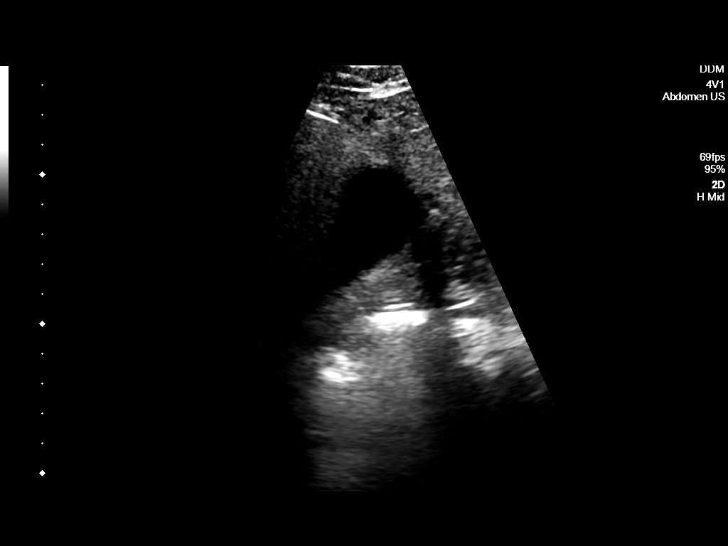
[im 16/48]
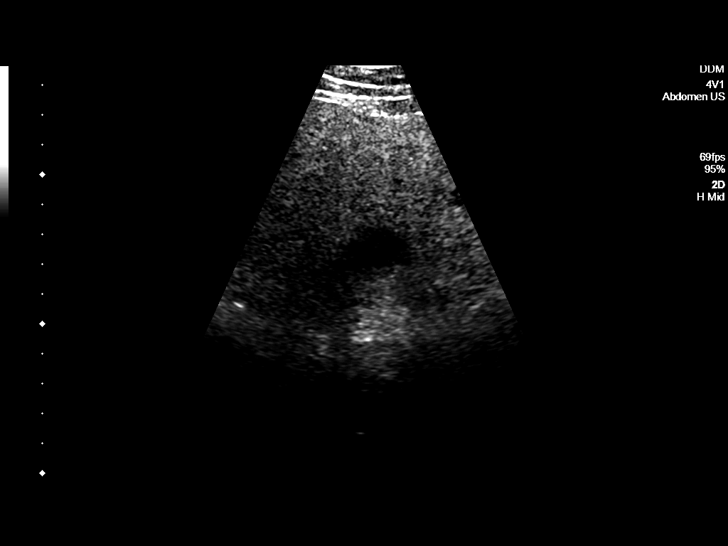
[im 18/48]
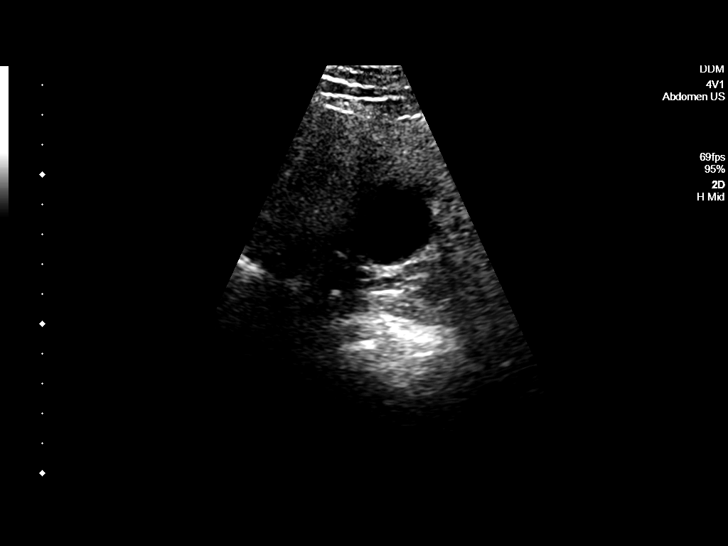
[im 22/48]
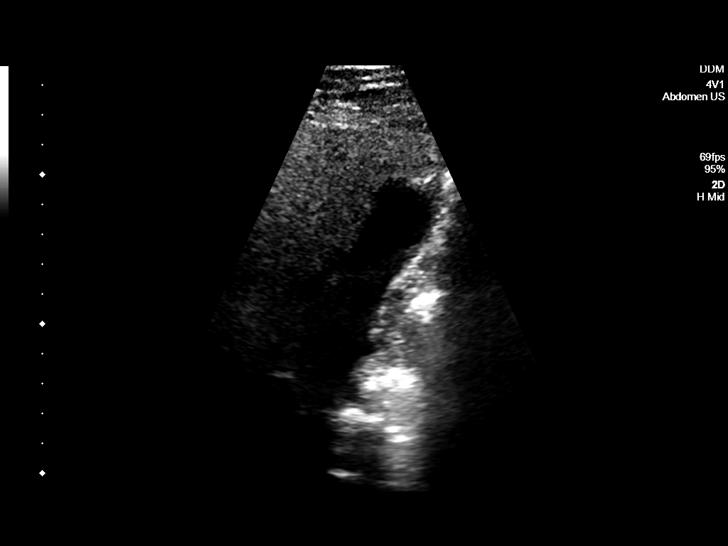
[im 26/48]
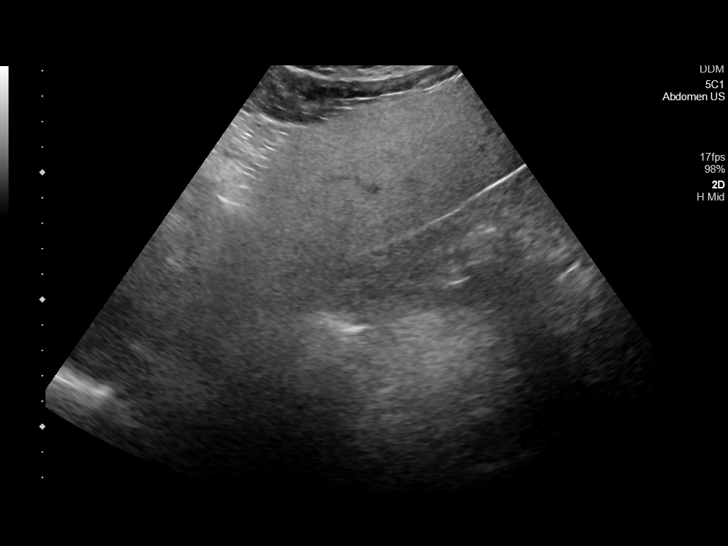
[im 30/48]
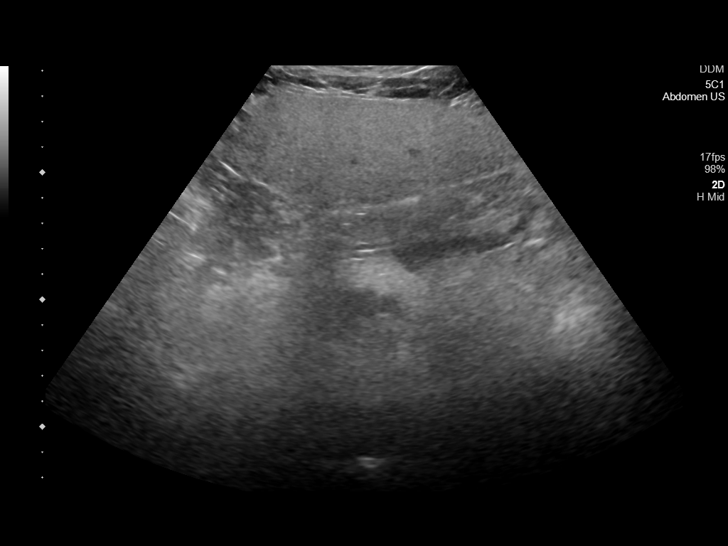
[im 32/48]
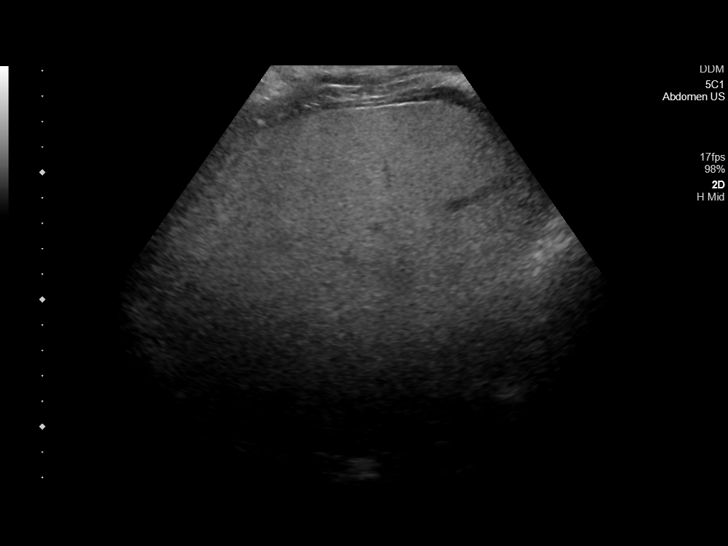
[im 36/48]
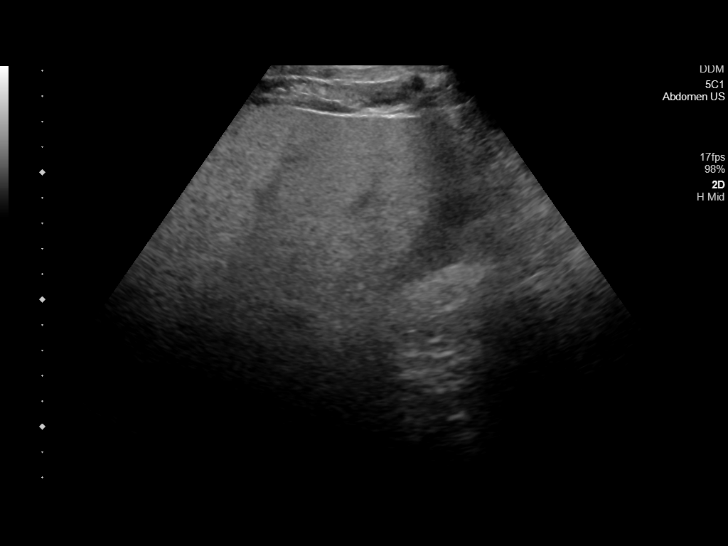
[im 40/48]
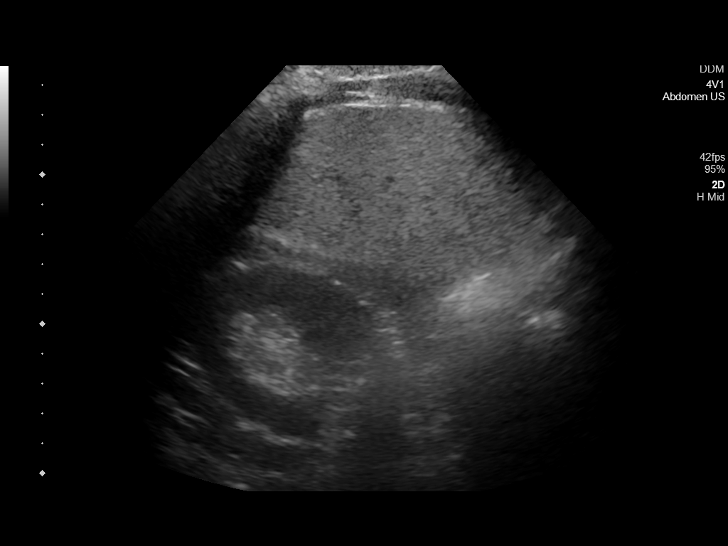
[im 44/48]
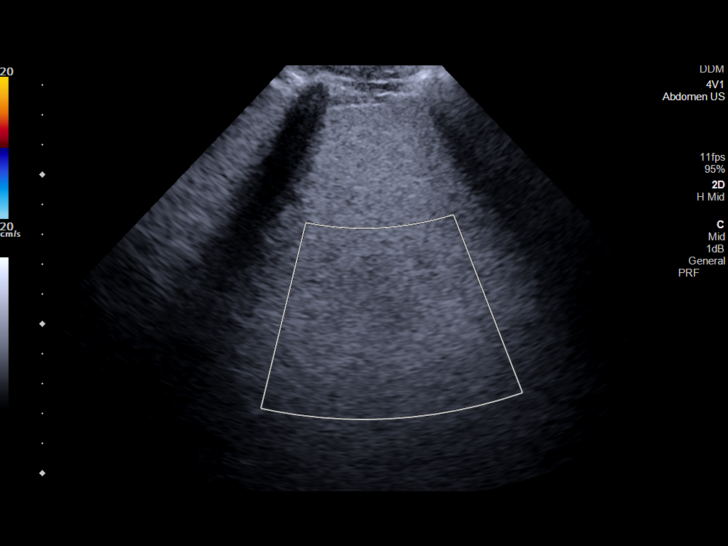
[im 48/48]
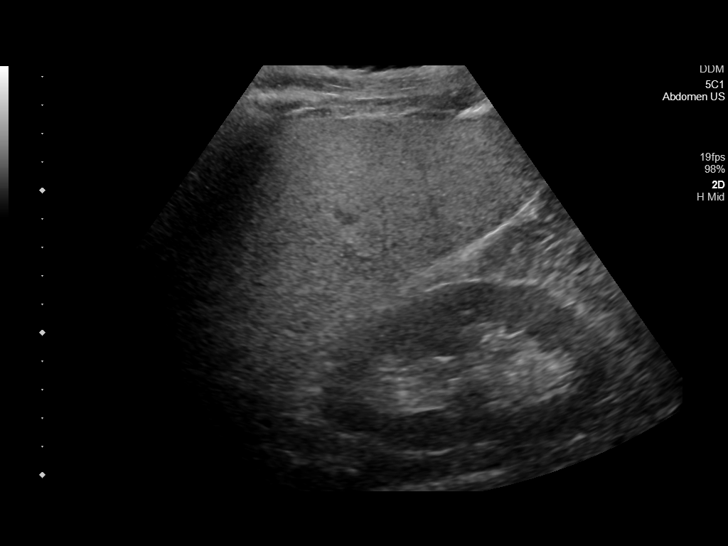

[14 of 25 positions shown; findings below may reference images not displayed]

FINDINGS: Gallbladder:

No gallstones or wall thickening visualized. No sonographic Murphy
sign noted by sonographer.

Common bile duct:

Diameter: 3 mm

Liver:

Diffuse increased liver echotexture consistent with hepatic
steatosis. No focal abnormality. Portal vein is patent on color
Doppler imaging with normal direction of blood flow towards the
liver.

Other: None.
IMPRESSION: 1. Diffuse hepatic steatosis.
2. No evidence of cholelithiasis or cholecystitis.

## 2022-03-30 ENCOUNTER — Other Ambulatory Visit: Payer: Self-pay | Admitting: Internal Medicine

## 2022-04-02 ENCOUNTER — Ambulatory Visit: Payer: Self-pay | Admitting: *Deleted

## 2022-04-07 ENCOUNTER — Other Ambulatory Visit: Payer: Self-pay | Admitting: Internal Medicine

## 2022-04-14 ENCOUNTER — Other Ambulatory Visit: Payer: 59

## 2022-04-19 ENCOUNTER — Ambulatory Visit: Payer: 59 | Admitting: Dermatology

## 2022-05-03 ENCOUNTER — Other Ambulatory Visit: Payer: Self-pay | Admitting: Family

## 2022-05-04 ENCOUNTER — Other Ambulatory Visit: Payer: Self-pay | Admitting: Internal Medicine

## 2022-05-06 ENCOUNTER — Other Ambulatory Visit (INDEPENDENT_AMBULATORY_CARE_PROVIDER_SITE_OTHER): Payer: 59

## 2022-05-06 DIAGNOSIS — K760 Fatty (change of) liver, not elsewhere classified: Secondary | ICD-10-CM | POA: Diagnosis not present

## 2022-05-06 DIAGNOSIS — I10 Essential (primary) hypertension: Secondary | ICD-10-CM

## 2022-05-06 DIAGNOSIS — E1165 Type 2 diabetes mellitus with hyperglycemia: Secondary | ICD-10-CM | POA: Diagnosis not present

## 2022-05-06 DIAGNOSIS — E782 Mixed hyperlipidemia: Secondary | ICD-10-CM

## 2022-05-06 LAB — CBC WITH DIFFERENTIAL/PLATELET
Basophils Absolute: 0.1 10*3/uL (ref 0.0–0.1)
Basophils Relative: 0.7 % (ref 0.0–3.0)
Eosinophils Absolute: 0.4 10*3/uL (ref 0.0–0.7)
Eosinophils Relative: 5.5 % — ABNORMAL HIGH (ref 0.0–5.0)
HCT: 50.6 % (ref 39.0–52.0)
Hemoglobin: 17.3 g/dL — ABNORMAL HIGH (ref 13.0–17.0)
Lymphocytes Relative: 31.1 % (ref 12.0–46.0)
Lymphs Abs: 2.4 10*3/uL (ref 0.7–4.0)
MCHC: 34.2 g/dL (ref 30.0–36.0)
MCV: 90.1 fl (ref 78.0–100.0)
Monocytes Absolute: 0.8 10*3/uL (ref 0.1–1.0)
Monocytes Relative: 9.7 % (ref 3.0–12.0)
Neutro Abs: 4.1 10*3/uL (ref 1.4–7.7)
Neutrophils Relative %: 53 % (ref 43.0–77.0)
Platelets: 260 10*3/uL (ref 150.0–400.0)
RBC: 5.61 Mil/uL (ref 4.22–5.81)
RDW: 13.1 % (ref 11.5–15.5)
WBC: 7.7 10*3/uL (ref 4.0–10.5)

## 2022-05-06 LAB — BASIC METABOLIC PANEL
BUN: 18 mg/dL (ref 6–23)
CO2: 28 mEq/L (ref 19–32)
Calcium: 9 mg/dL (ref 8.4–10.5)
Chloride: 101 mEq/L (ref 96–112)
Creatinine, Ser: 1.12 mg/dL (ref 0.40–1.50)
GFR: 78.04 mL/min (ref >60.00)
Glucose, Bld: 147 mg/dL — ABNORMAL HIGH (ref 70–99)
Potassium: 4 mEq/L (ref 3.5–5.1)
Sodium: 137 mEq/L (ref 135–145)

## 2022-05-06 LAB — LIPID PANEL
Cholesterol: 145 mg/dL (ref 0–200)
HDL: 38.4 mg/dL — ABNORMAL LOW (ref >39.00)
LDL Cholesterol: 86 mg/dL (ref 0–99)
NonHDL: 106.68
Total CHOL/HDL Ratio: 4
Triglycerides: 104 mg/dL (ref 0.0–149.0)
VLDL: 20.8 mg/dL (ref 0.0–40.0)

## 2022-05-06 LAB — HEPATIC FUNCTION PANEL
ALT: 34 U/L (ref 0–53)
AST: 21 U/L (ref 0–37)
Albumin: 4.5 g/dL (ref 3.5–5.2)
Alkaline Phosphatase: 55 U/L (ref 39–117)
Bilirubin, Direct: 0.1 mg/dL (ref 0.0–0.3)
Total Bilirubin: 0.7 mg/dL (ref 0.2–1.2)
Total Protein: 7.3 g/dL (ref 6.0–8.3)

## 2022-05-06 LAB — TSH: TSH: 1.53 u[IU]/mL (ref 0.35–5.50)

## 2022-05-06 LAB — HEMOGLOBIN A1C: Hgb A1c MFr Bld: 7.4 % — ABNORMAL HIGH (ref 4.6–6.5)

## 2022-05-07 ENCOUNTER — Other Ambulatory Visit: Payer: Self-pay

## 2022-05-07 MED ORDER — FREESTYLE LIBRE 3 SENSOR MISC: 1.0000 "application " | 11 refills | Status: DC

## 2022-05-08 MED ORDER — AMPHETAMINE-DEXTROAMPHETAMINE 10 MG PO TABS
ORAL_TABLET | ORAL | 0 refills | Status: DC
Start: 2022-05-08 — End: 2022-06-16

## 2022-05-08 MED ORDER — AMPHETAMINE-DEXTROAMPHET ER 10 MG PO CP24: ORAL_CAPSULE | ORAL | 0 refills | Status: DC

## 2022-05-21 ENCOUNTER — Telehealth: Payer: Self-pay

## 2022-05-24 ENCOUNTER — Ambulatory Visit: Payer: 59 | Admitting: Dermatology

## 2022-06-02 ENCOUNTER — Ambulatory Visit (INDEPENDENT_AMBULATORY_CARE_PROVIDER_SITE_OTHER): Payer: 59 | Admitting: Dermatology

## 2022-06-02 DIAGNOSIS — D229 Melanocytic nevi, unspecified: Secondary | ICD-10-CM

## 2022-06-02 DIAGNOSIS — L814 Other melanin hyperpigmentation: Secondary | ICD-10-CM | POA: Diagnosis not present

## 2022-06-02 DIAGNOSIS — Z86018 Personal history of other benign neoplasm: Secondary | ICD-10-CM | POA: Diagnosis not present

## 2022-06-02 DIAGNOSIS — H61001 Unspecified perichondritis of right external ear: Secondary | ICD-10-CM | POA: Diagnosis not present

## 2022-06-02 DIAGNOSIS — L989 Disorder of the skin and subcutaneous tissue, unspecified: Secondary | ICD-10-CM

## 2022-06-02 DIAGNOSIS — B079 Viral wart, unspecified: Secondary | ICD-10-CM

## 2022-06-02 DIAGNOSIS — D1800 Hemangioma unspecified site: Secondary | ICD-10-CM | POA: Diagnosis not present

## 2022-06-02 DIAGNOSIS — L578 Other skin changes due to chronic exposure to nonionizing radiation: Secondary | ICD-10-CM

## 2022-06-02 DIAGNOSIS — L821 Other seborrheic keratosis: Secondary | ICD-10-CM

## 2022-06-02 DIAGNOSIS — Z1283 Encounter for screening for malignant neoplasm of skin: Secondary | ICD-10-CM

## 2022-06-02 MED ORDER — MOMETASONE FUROATE 0.1 % EX CREA: TOPICAL_CREAM | CUTANEOUS | 1 refills | Status: DC

## 2022-06-02 NOTE — Patient Instructions (Addendum)
Discussed viral etiology and risk of spread.  Discussed multiple treatments may be required to clear warts.  Discussed possible post-treatment dyspigmentation and risk of recurrence.   Due to recent changes in healthcare laws, you may see results of your pathology and/or laboratory studies on MyChart before the doctors have had a chance to review them. We understand that in some cases there may be results that are confusing or concerning to you. Please understand that not all results are received at the same time and often the doctors may need to interpret multiple results in order to provide you with the best plan of care or course of treatment. Therefore, we ask that you please give [place] 2 business days to thoroughly review all your results before contacting the office for clarification. Should we see a critical lab result, you will be contacted sooner.   If You Need Anything After Your Visit  If you have any questions or concerns for your doctor, please call our main line at [phone] and press option 4 to reach your doctor's medical assistant. If no one answers, please leave a voicemail as directed and we will return your call as soon as possible. Messages left after 4 pm will be answered the following business day.   You may also send [place] a message via [place]. We typically respond to MyChart messages within 1-2 business days.  For prescription refills, please ask your pharmacy to contact our office. Our fax number is [phone].  If you have an urgent issue when the clinic is closed that cannot wait until the next business day, you can page your doctor at the number below.    Please note that while we do our best to be available for urgent issues outside of office hours, we are not available 24/7.   If you have an urgent issue and are unable to reach [place], you may choose to seek medical care at your doctor's office, retail clinic, urgent care center, or emergency room.  If you have a medical  emergency, please immediately call 911 or go to the emergency department.  Pager Numbers  - Dr. Nehemiah Massed: [phone]  - Dr. Laurence Ferrari: [phone]  - Dr. Nicole Kindred: [phone]  In the event of inclement weather, please call our main line at [phone] for an update on the status of any delays or closures.  Dermatology Medication Tips: Please keep the boxes that topical medications come in in order to help keep track of the instructions about where and how to use these. Pharmacies typically print the medication instructions only on the boxes and not directly on the medication tubes.   If your medication is too expensive, please contact our office at [phone] option 4 or send [place] a message through [place].   We are unable to tell what your co-pay for medications will be in advance as this is different depending on your insurance coverage. However, we may be able to find a substitute medication at lower cost or fill out paperwork to get insurance to cover a needed medication.   If a prior authorization is required to get your medication covered by your insurance company, please allow [place] 1-2 business days to complete this process.  Drug prices often vary depending on where the prescription is filled and some pharmacies may offer cheaper prices.  The website www.goodrx.com contains coupons for medications through different pharmacies. The prices here do not account for what the cost may be with help from insurance (it may be cheaper with your  insurance), but the website can give you the price if you did not use any insurance.  - You can print the associated coupon and take it with your prescription to the pharmacy.  - You may also stop by our office during regular business hours and pick up a GoodRx coupon card.  - If you need your prescription sent electronically to a different pharmacy, notify our office through [facility] or by phone at [phone] option 4.     Si Usted  Necesita Algo Despus de Su Visita  Tambin puede enviarnos un mensaje a travs de Pharmacist, community. Por lo general respondemos a los mensajes de MyChart en el transcurso de 1 a 2 das hbiles.  Para renovar recetas, por favor pida a su farmacia que se ponga en contacto con nuestra oficina. Harland Dingwall de fax es [place] [phone].  Si tiene un asunto urgente cuando la clnica est cerrada y que no puede esperar hasta el siguiente da hbil, puede llamar/localizar a su doctor(a) al nmero que aparece a continuacin.   Por favor, tenga en cuenta que aunque hacemos todo lo posible para estar disponibles para asuntos urgentes fuera del horario de [place], no estamos disponibles las 24 horas del da, los 7 das de la [place].   Si tiene un problema urgente y no puede comunicarse con nosotros, puede optar por buscar atencin mdica  en el consultorio de su doctor(a), en una clnica privada, en un centro de atencin urgente o en una sala de emergencias.  Si tiene Engineering geologist, por favor llame inmediatamente al 911 o vaya a la sala de emergencias.  Nmeros de bper  - Dr. Nehemiah Massed: [phone]  - Dra. Moye: [phone]  - Dra. Nicole Kindred: [phone]  En caso de inclemencias del [place], por favor llame a Johnsie Kindred principal al [phone] para una actualizacin sobre el [place] de cualquier retraso o cierre.  Consejos para la medicacin en dermatologa: Por favor, guarde las cajas en las que vienen los medicamentos de uso tpico para ayudarle a seguir las instrucciones sobre dnde y cmo usarlos. Las farmacias generalmente imprimen las instrucciones del medicamento slo en las cajas y no directamente en los tubos del [place].   Si su medicamento es muy caro, por favor, pngase en contacto con Zigmund Daniel llamando al [phone] y presione la opcin 4 o envenos un mensaje a travs de Pharmacist, community.   No podemos decirle cul ser su copago por los medicamentos por adelantado ya que esto es  diferente dependiendo de la cobertura de su seguro. Sin embargo, es posible que podamos encontrar un medicamento sustituto a Electrical engineer un formulario para que el seguro cubra el medicamento que se considera necesario.   Si se requiere una autorizacin previa para que su compaa de seguros [place] su medicamento, por favor permtanos de 1 a 2 das hbiles para completar este proceso.  Los precios de los medicamentos varan con frecuencia dependiendo del Environmental consultant de dnde se surte la receta y alguna farmacias pueden ofrecer precios ms baratos.  El sitio web www.goodrx.com tiene cupones para medicamentos de Airline pilot. Los precios aqu no tienen en cuenta lo que podra costar con la ayuda del seguro (puede ser ms barato con su seguro), pero el sitio web puede darle el precio si no utiliz Research scientist (physical sciences).  - Puede imprimir el cupn correspondiente y llevarlo con su receta a la farmacia.  - Tambin puede pasar por nuestra oficina durante el horario de atencin regular y Charity fundraiser una tarjeta de  cupones de GoodRx.  - Si necesita que su receta se enve electrnicamente a una farmacia diferente, informe a nuestra oficina a travs de MyChart de [place] o por telfono llamando al [phone] y presione la opcin 4.

## 2022-06-02 NOTE — Progress Notes (Signed)
Follow-Up Visit   Subjective  Francisco Jackson is a 46 y.o. male who presents for the following: Annual Exam (Hx dysplastic nevi - lesions on the B/L ear and gum that patient is concerned about). The patient presents for Total-Body Skin Exam (TBSE) for skin cancer screening and mole check.  The patient has spots, moles and lesions to be evaluated, some may be new or changing and the patient has concerns that these could be cancer.  The following portions of the chart were reviewed this encounter and updated as appropriate:   Tobacco  Allergies  Meds  Problems  Med Hx  Surg Hx  Fam Hx     Review of Systems:  No other skin or systemic complaints except as noted in HPI or Assessment and Plan.  Objective  Well appearing patient in no apparent distress; mood and affect are within normal limits.  A full examination was performed including scalp, head, eyes, ears, nose, lips, neck, chest, axillae, abdomen, back, buttocks, bilateral upper extremities, bilateral lower extremities, hands, feet, fingers, toes, fingernails, and toenails. All findings within normal limits unless otherwise noted below.  R elbow x 4 (4) Verrucous papules -- Discussed viral etiology and contagion.   R upper gingiva 0.3 cm flesh colored flat papule.    Assessment & Plan  Chondrodermatitis nodularis helicis of right ear B/L sup helix edge Chondrodermatitis Nodularis Chronica Helicis (CNCH or CNH) is a common, benign inflammatory condition of the ear cartilage and overlying skin associated with very sensitive tender papule(s).  Trauma or pressure from sleeping on the ear or from cell phone use and sun damage may be exacerbating factors.  Treatment may include using a C-shaped airplane neck pillow for sleeping on the side of the head so no pressure is on the ear.  Other treatments include topical or intralesional steroids; liquid nitrogen or laser destruction; shave removal or excision.  The condition can be  difficult to treat and persist or recur despite treatment.   Start Mometasone 0.1% cream to aa's BID 5d/wk.   mometasone (ELOCON) 0.1 % cream - B/L sup helix edge Apply to aa's ears BID up to 5d/wk PRN.  Viral warts, unspecified type (4) R elbow x 4 Discussed viral etiology and risk of spread.  Discussed multiple treatments may be required to clear warts.  Discussed possible post-treatment dyspigmentation and risk of recurrence.  Destruction of lesion - R elbow x 4 Complexity: simple   Destruction method: cryotherapy   Informed consent: discussed and consent obtained   Timeout:  patient name, date of birth, surgical site, and procedure verified Lesion destroyed using liquid nitrogen: Yes   Region frozen until ice ball extended beyond lesion: Yes   Outcome: patient tolerated procedure well with no complications   Post-procedure details: wound care instructions given    Benign skin growth 0.3 cm - inside mouth on gum Firm flesh colored papule R upper gingiva Possible bite fibroma vs other scar tissue  - Likely trauma related. Reassured patient that lesion appears benign today and non-concerning. Patient to follow up with dentist or our office if any changes for possible biopsy.   Lentigines - Scattered tan macules - Due to sun exposure - Benign-appearing, observe - Recommend daily broad spectrum sunscreen SPF 30+ to sun-exposed areas, reapply every 2 hours as needed. - Call for any changes  Seborrheic Keratoses - Stuck-on, waxy, tan-brown papules and/or plaques  - Benign-appearing - Discussed benign etiology and prognosis. - Observe - Call for any changes  Melanocytic Nevi - Tan-brown and/or pink-flesh-colored symmetric macules and papules - Benign appearing on exam today - Observation - Call clinic for new or changing moles - Recommend daily use of broad spectrum spf 30+ sunscreen to sun-exposed areas.   Hemangiomas - Red papules - Discussed benign nature -  Observe - Call for any changes  Actinic Damage - Chronic condition, secondary to cumulative UV/sun exposure - diffuse scaly erythematous macules with underlying dyspigmentation - Recommend daily broad spectrum sunscreen SPF 30+ to sun-exposed areas, reapply every 2 hours as needed.  - Staying in the shade or wearing long sleeves, sun glasses (UVA+UVB protection) and wide brim hats (4-inch brim around the entire circumference of the hat) are also recommended for sun protection.  - Call for new or changing lesions.  History of Dysplastic Nevi - some re-pigmentation at the left prox mid tricep axilla - No evidence of recurrence today - Recommend regular full body skin exams - Recommend daily broad spectrum sunscreen SPF 30+ to sun-exposed areas, reapply every 2 hours as needed.  - Call if any new or changing lesions are noted between office visits - Will recheck next year if persistent plan shave removal/biopsy.  Skin cancer screening performed today.  Return in about 1 year (around 06/03/2023) for TBSE.  Luther Redo, CMA, am acting as scribe for Sarina Ser, MD . Documentation: I have reviewed the above documentation for accuracy and completeness, and I agree with the above.  Sarina Ser, MD

## 2022-06-03 ENCOUNTER — Telehealth (INDEPENDENT_AMBULATORY_CARE_PROVIDER_SITE_OTHER): Payer: 59 | Admitting: Internal Medicine

## 2022-06-03 ENCOUNTER — Encounter: Payer: Self-pay | Admitting: Internal Medicine

## 2022-06-03 ENCOUNTER — Other Ambulatory Visit: Payer: Self-pay | Admitting: Internal Medicine

## 2022-06-03 DIAGNOSIS — I10 Essential (primary) hypertension: Secondary | ICD-10-CM

## 2022-06-03 DIAGNOSIS — J4530 Mild persistent asthma, uncomplicated: Secondary | ICD-10-CM

## 2022-06-03 DIAGNOSIS — E1165 Type 2 diabetes mellitus with hyperglycemia: Secondary | ICD-10-CM | POA: Diagnosis not present

## 2022-06-03 DIAGNOSIS — U071 COVID-19: Secondary | ICD-10-CM

## 2022-06-03 MED ORDER — EMPAGLIFLOZIN 25 MG PO TABS: 25.0000 mg | ORAL_TABLET | Freq: Every day | ORAL | 2 refills | Status: DC

## 2022-06-03 MED ORDER — FREESTYLE LIBRE 3 SENSOR MISC: 1.0000 "application " | 11 refills | Status: DC

## 2022-06-03 NOTE — Progress Notes (Signed)
Patient ID: Francisco Jackson, male   DOB: [birth year], 46 y.o.   MRN: [id number]   Virtual Visit via video Note  All issues noted in this document were discussed and addressed.  No physical exam was performed (except for noted visual exam findings with Video Visits).   I connected with Cordelia Pen by a video enabled telemedicine application and verified that I am speaking with the correct person using two identifiers. Location patient: home Location provider: work or Persons participating in the virtual visit: patient, provider  The limitations, risks, security and privacy concerns of performing an evaluation and management service by video and the availability of in person appointments have been discussed.  It has also been discussed with the patient that there may be a patient responsible charge related to this service. The patient expressed understanding and agreed to proceed.   Reason for visit: follow up appt  HPI: Scheduled follow up to discussed his diabetes and further treatment.  Last a1c increased to 7.4 ( up from 6.7).  discussed low carb diet and exercise.  Discussed monitoring sugars.  Reports did better with CGM.  Issues with insurance coverage.  Request rx sent in.  Discussed current medications.  Doing better on metformin XR - one bid.  Discussed increasing jardiance.  Also reports is covid positive.  Went on a trip recently.  Friend tested positive.  Monday 05/2122 - started noticing cough.  Increased nasal drainage.  Raspy voice.  No sinus congestion or sinus pressure.  Previous eye crusting some.  Cough occasionally productive - yellow mucus.  No fever.  No nausea or vomiting.  No diarrhea.  No chest pain or sob.  Some fatigue.  Discussed treatment, including oral antiviral medication.     ROS: See pertinent positives and negatives per HPI.  Past Medical History:  Diagnosis Date   Asthma    Atypical mole 04/05/2013   Left forearm. Atypical combined nevus, moderate  atypia.   Diabetes mellitus without complication ([place])    Dysplastic nevus 04/23/2014   Left prox. mid. tricep axilla. Mild atypia, margins free.   Hypertension    Personality disorder ([place])    borderline    Past Surgical History:  Procedure Laterality Date   ANTERIOR CRUCIATE LIGAMENT REPAIR Right 2006   CERVICAL [place] SURGERY  2010   COLONOSCOPY WITH PROPOFOL N/A 11/01/2019   Procedure: COLONOSCOPY WITH PROPOFOL;  Surgeon: Jonathon Bellows, MD;  Location: [facility] ENDOSCOPY;  Service: Gastroenterology;  Laterality: N/A;   MENISCUS REPAIR Left 1996    Family History  Problem Relation Age of Onset   Diabetes Mother    Melanoma Mother    Diabetes Paternal Aunt    Heart attack Paternal Aunt    Diabetes Paternal Uncle    Diabetes Maternal Grandmother    Diabetes Maternal Grandfather    Heart disease Maternal Grandfather    Heart attack Maternal Grandfather    Heart attack Father    Hypertension Father    Diabetes Sister    Hypertension Sister    Kidney cancer Neg Hx    Kidney disease Neg Hx    Prostate cancer Neg Hx     SOCIAL HX: reviewed.    Current Outpatient Medications:    empagliflozin (JARDIANCE) 25 MG TABS tablet, Take 1 tablet (25 mg total) by mouth daily before breakfast., Disp: 30 tablet, Rfl: 2   amLODipine (NORVASC) 10 MG tablet, TAKE 1 TABLET BY MOUTH ONCE DAILY, Disp: 90 tablet, Rfl: 1   amphetamine-dextroamphetamine (ADDERALL XR) 10  MG 24 hr capsule, TAKE 1 CAPSULE BY MOUTH DAILY EVERY MORNING., Disp: 30 capsule, Rfl: 0   amphetamine-dextroamphetamine (ADDERALL) 10 MG tablet, TAKE 1 TABLET BY MOUTH EVERY EVENING., Disp: 30 tablet, Rfl: 0   Continuous Blood Gluc Sensor (FREESTYLE LIBRE 3 SENSOR) MISC, 1 application  by Does not apply route every 14 (fourteen) days. Place 1 sensor on the skin every 14 days. Use to check glucose continuously, Disp: 2 each, Rfl: 11   hydrochlorothiazide (MICROZIDE) 12.5 MG capsule, TAKE 1 CAPSULE BY MOUTH ONCE DAILY, Disp: 90 capsule,  Rfl: 1   Insulin Pen Needle (PEN NEEDLES) 30G X 5 MM MISC, Inject 1 Applicatorful into the skin once a week. Use with trulicity pen, Disp: 537 each, Rfl: 2   lisinopril (ZESTRIL) 5 MG tablet, TAKE 1 TABLET BY MOUTH DAILY, Disp: 90 tablet, Rfl: 1   metFORMIN (GLUCOPHAGE-XR) 500 MG 24 hr tablet, One tablet BID, Disp: 180 tablet, Rfl: 1   mometasone (ELOCON) 0.1 % cream, Apply to aa's ears BID up to 5d/wk PRN., Disp: 15 g, Rfl: 1   montelukast (SINGULAIR) 10 MG tablet, TAKE ONE TABLET EVERY MORNING, Disp: 30 tablet, Rfl: 0   Multiple Vitamin (MULTIVITAMIN) tablet, Take 1 tablet by mouth daily., Disp: , Rfl:    rosuvastatin (CRESTOR) 5 MG tablet, TAKE 1 TABLET BY MOUTH ONCE DAILY, Disp: 90 tablet, Rfl: 3   sertraline (ZOLOFT) 100 MG tablet, TAKE 1 TABLET BY MOUTH ONCE DAILY, Disp: 90 tablet, Rfl: 1   sildenafil (REVATIO) 20 MG tablet, Take 2-4 tablets two hours before intercouse on an empty stomach.  Do not take with nitrates., Disp: 30 tablet, Rfl: 1   TRULICITY 1.5 SM/2.7MB SOPN, INJECT 1.5MG  SUBQ ONCE EVERY 7 DAYS, Disp: 2 mL, Rfl: 1  EXAM:  GENERAL: alert, oriented, appears well and in no acute distress  HEENT: atraumatic, conjunttiva clear, no obvious abnormalities on inspection of external nose and ears  NECK: normal movements of the head and neck  LUNGS: on inspection no signs of respiratory distress, breathing rate appears normal, no obvious gross SOB, gasping or wheezing  CV: no obvious cyanosis  PSYCH/NEURO: pleasant and cooperative, no obvious depression or anxiety, speech and thought processing grossly intact  ASSESSMENT AND PLAN:  Discussed the following assessment and plan:  Problem List Items Addressed This Visit     Asthma, mild persistent    Has seen Dr Donneta Romberg - continue singulair and astelin. Tested positive for covid this week.  Discussed treatment as outlined.  Wants to hold on oral antiviral medication at this time.  Treat symptoms.  Denies chest pain or sob.   Follow.        COVID-19 virus infection    Symptoms started 05/31/22.  Tested positive.  Discussed oral antiviral medication and EUA.  He wants to hold on antiviral medication.  Continue singulair and astelin.  Discussed using steroid nasal spray.  Robitussin DM.  Denies chest pain or sob.  Follow closely.  Call with update.  Discussed quarantine guidelines.  Follow.       Diabetes mellitus ([place])    On metformin, jardiance and trulicity. Doing better since decreasing dose of metformin.  Discussed low carb diet and exercise.  Given recent increased a1c, will increase jardiance to 47m q day.  Follow met b and a1c. Agreeable to CGM.       Relevant Medications   empagliflozin (JARDIANCE) 25 MG TABS tablet   Hypertension, essential    Continue hctz and amlodipine.  Follow  pressures.  Follow metabolic panel.        Return in about 10 weeks (around 08/12/2022) for follow up appt (66mn).   I discussed the assessment and treatment plan with the patient. The patient was provided an opportunity to ask questions and all were answered. The patient agreed with the plan and demonstrated an understanding of the instructions.   The patient was advised to call back or seek an in-person evaluation if the symptoms worsen or if the condition fails to improve as anticipated.   CEinar Pheasant MD

## 2022-06-05 ENCOUNTER — Encounter: Payer: Self-pay | Admitting: Dermatology

## 2022-06-06 ENCOUNTER — Encounter: Payer: Self-pay | Admitting: Internal Medicine

## 2022-06-06 DIAGNOSIS — U071 COVID-19: Secondary | ICD-10-CM | POA: Insufficient documentation

## 2022-06-06 NOTE — Assessment & Plan Note (Addendum)
Has seen Dr Donneta Romberg - continue singulair and astelin. Tested positive for covid this week.  Discussed treatment as outlined.  Wants to hold on oral antiviral medication at this time.  Treat symptoms.  Denies chest pain or sob.  Follow.

## 2022-06-06 NOTE — Assessment & Plan Note (Addendum)
Symptoms started 05/31/22.  Tested positive.  Discussed oral antiviral medication and EUA.  He wants to hold on antiviral medication.  Continue singulair and astelin.  Discussed using steroid nasal spray.  Robitussin DM.  Denies chest pain or sob.  Follow closely.  Call with update.  Discussed quarantine guidelines.  Follow.

## 2022-06-06 NOTE — Assessment & Plan Note (Signed)
Continue hctz and amlodipine.  Follow pressures.  Follow metabolic panel.

## 2022-06-06 NOTE — Assessment & Plan Note (Signed)
On metformin, jardiance and trulicity. Doing better since decreasing dose of metformin.  Discussed low carb diet and exercise.  Given recent increased a1c, will increase jardiance to 92m q day.  Follow met b and a1c. Agreeable to CGM.

## 2022-06-07 IMAGING — MR MR ABDOMEN WO/W CM MRCP
19 of 22 series · 43 of 48 positions shown · IV contrast (9ml Gadavist)
Comparison: Abdominal ultrasound 04/30/2020.  CT 05/02/2020.

CLINICAL DATA: Epigastric pain for 1 month. History of
pancreatitis. History of ethanol abuse.

EXAM:
MRI ABDOMEN WITHOUT AND WITH CONTRAST (INCLUDING MRCP)
TECHNIQUE: Multiplanar multisequence MR imaging of the abdomen was performed
both before and after the administration of intravenous contrast.
Heavily T2-weighted images of the biliary and pancreatic ducts were
obtained, and three-dimensional MRCP images were rendered by post
processing.
CONTRAST:  9mL GADAVIST GADOBUTROL 1 MMOL/ML IV SOLN field

[Series 3: T2 · coronal · 6.0mm · 1.19mm/px · 2 of 30 slices shown (1 of 2)]
[im 1/30]
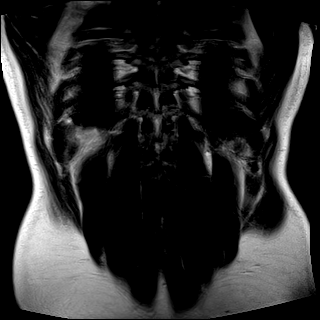
[im 30/30]
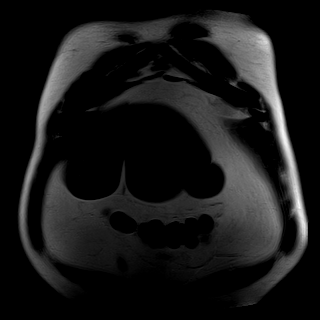

[Series 4: T2 · axial · 6.0mm · 1.19mm/px · 1 of 35 slices shown (2 of 2)]
[im 1/35]
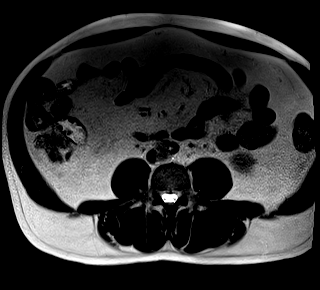

[Series 5: T1 · axial · 6.0mm · 0.74mm/px · 1 of 35 slices shown (1 of 2)]
[im 1/35]
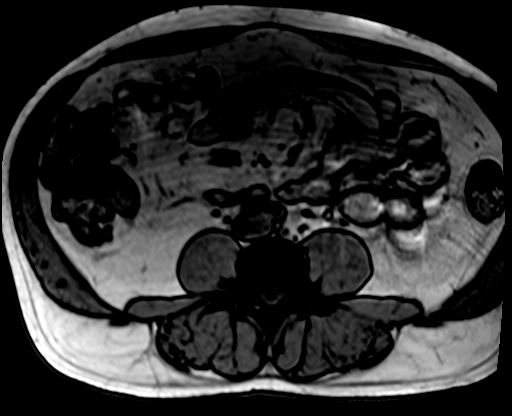

[Series 5: T1 · axial · 6.0mm · 0.74mm/px · 1 of 35 slices shown (2 of 2)]
[im 1/35]
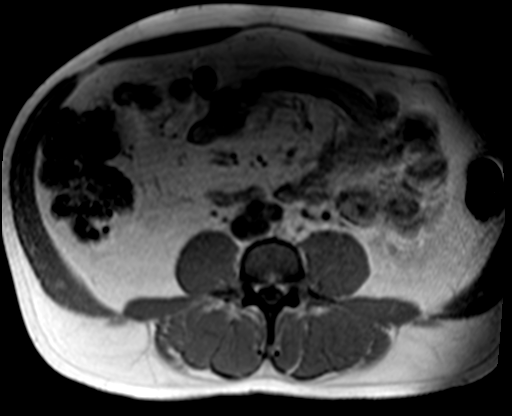

[Series 8: T2 fat-sat · axial · 6.0mm · 1.19mm/px · 1 of 35 slices shown]
[im 1/35]
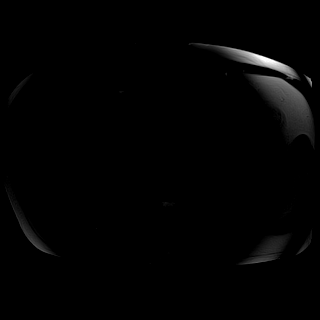

[Series 9: ax dwi_tracew · axial · 6.0mm · 1.42mm/px · z∈[-86,+166]mm · 4 of 108 slices shown]
[im 1/108]
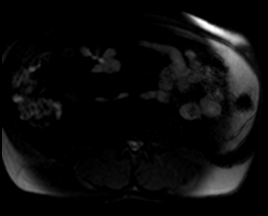
[im 36/108]
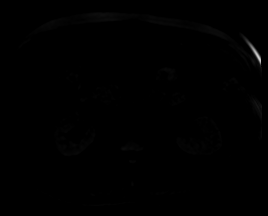
[im 72/108]
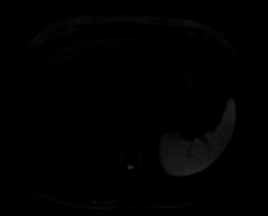
[im 108/108]
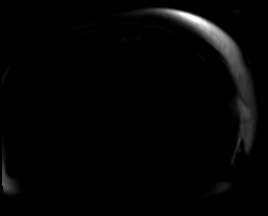

[Series 10: ax dwi_adc · axial · 6.0mm · 1.42mm/px · 1 of 36 slices shown]
[im 1/36]
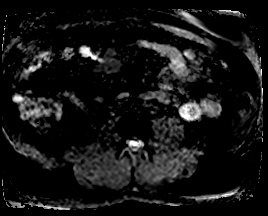

[Series 14: MRCP · coronal · 3.0mm · 1.12mm/px · 1 of 20 slices shown]
[im 1/20]
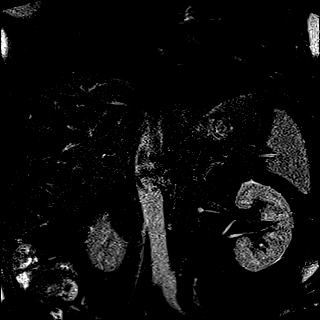

[Series 15: radials · coronal · 50.0mm · 0.78mm/px · 1 of 5 slices shown]
[im 1/5]
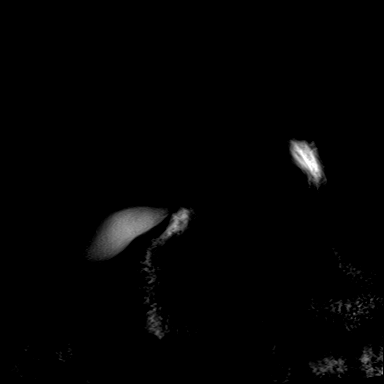

[Series 16: T1 dynamic fat-sat · axial · non-contrast · 3.0mm · 1.25mm/px · z∈[-85,+152]mm · 3 of 80 slices shown (1 of 5)]
[im 1/80]
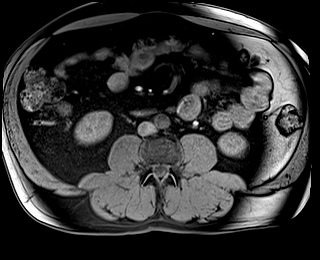
[im 40/80]
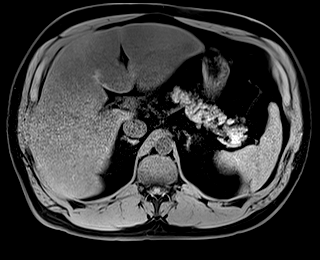
[im 80/80]
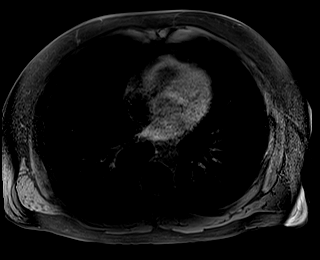

[Series 17: T1 dynamic fat-sat post-contrast · axial · 3.0mm · 1.25mm/px · z∈[-85,+152]mm · 3 of 80 slices shown (1 of 4)]
[im 1/80]
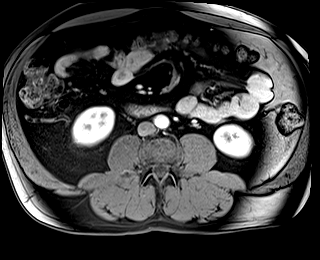
[im 40/80]
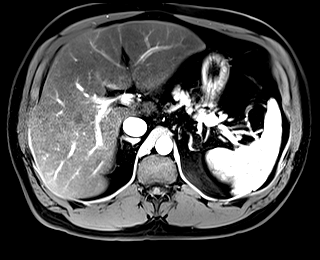
[im 80/80]
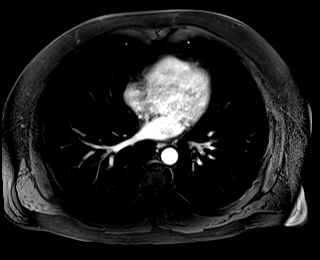

[Series 18: T1 dynamic fat-sat · axial · 3.0mm · 1.25mm/px · z∈[-85,+152]mm · 3 of 80 slices shown (2 of 5)]
[im 1/80]
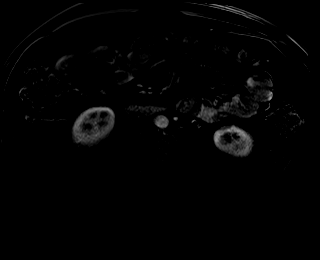
[im 40/80]
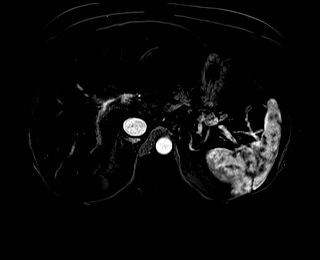
[im 80/80]
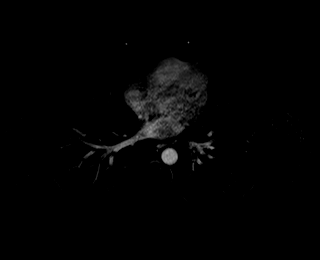

[Series 19: T1 dynamic fat-sat post-contrast · axial · 3.0mm · 1.25mm/px · z∈[-85,+152]mm · 3 of 80 slices shown (2 of 4)]
[im 1/80]
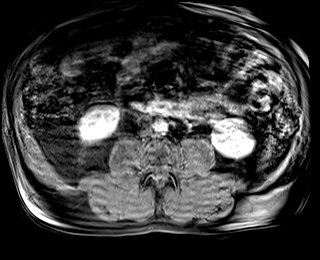
[im 40/80]
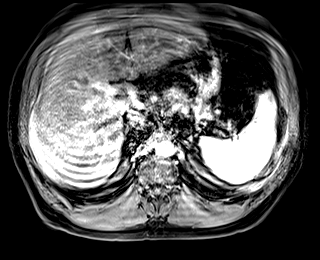
[im 80/80]
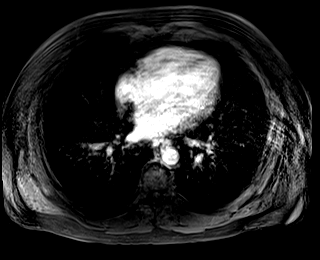

[Series 20: T1 dynamic fat-sat · axial · 3.0mm · 1.25mm/px · z∈[-85,+152]mm · 3 of 80 slices shown (3 of 5)]
[im 1/80]
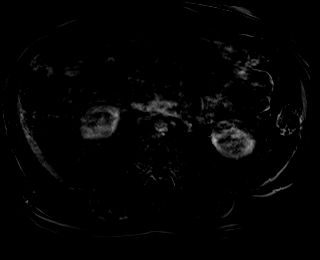
[im 40/80]
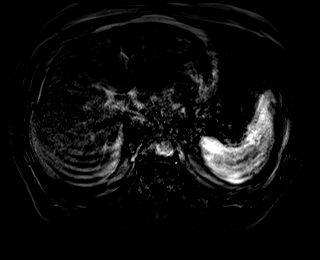
[im 80/80]
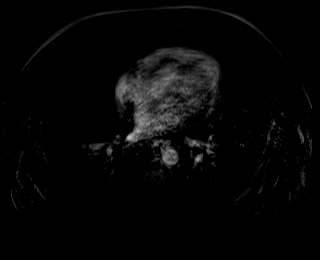

[Series 21: T1 dynamic fat-sat post-contrast · axial · 3.0mm · 1.25mm/px · z∈[-85,+152]mm · 3 of 80 slices shown (3 of 4)]
[im 1/80]
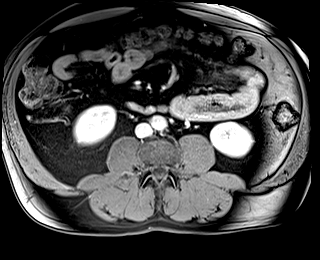
[im 40/80]
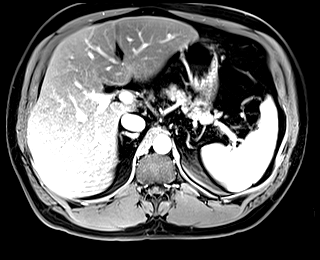
[im 80/80]
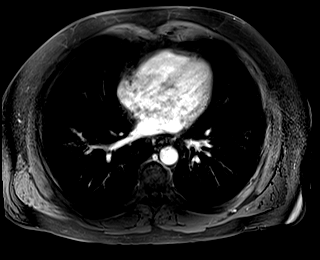

[Series 22: T1 dynamic fat-sat · axial · 3.0mm · 1.25mm/px · z∈[-85,+152]mm · 3 of 80 slices shown (4 of 5)]
[im 1/80]
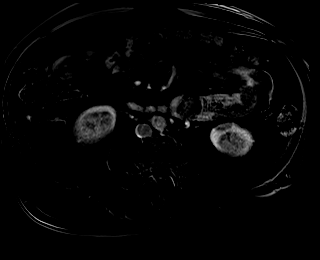
[im 40/80]
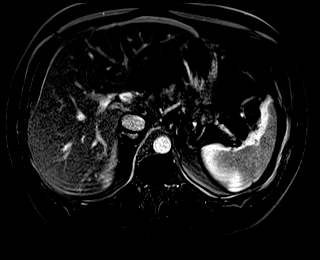
[im 80/80]
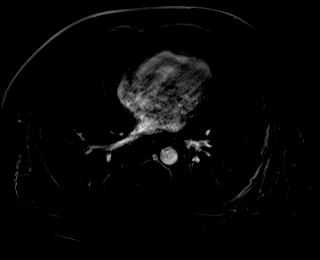

[Series 23: T1 dynamic post-contrast · coronal · 3.0mm · 1.31mm/px · 3 of 72 slices shown]
[im 1/72]
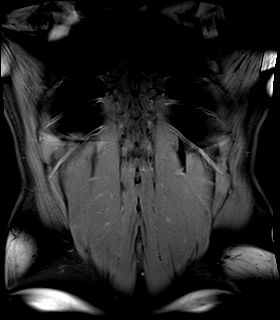
[im 36/72]
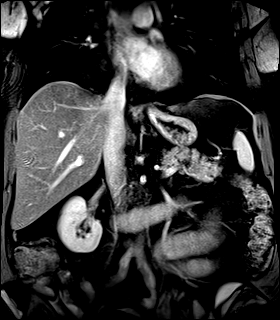
[im 72/72]
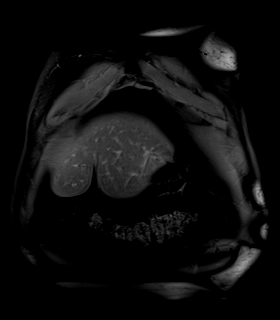

[Series 24: T1 dynamic fat-sat post-contrast · axial · 3.0mm · 1.25mm/px · z∈[-85,+152]mm · 3 of 80 slices shown (4 of 4)]
[im 1/80]
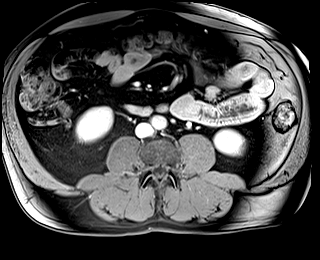
[im 40/80]
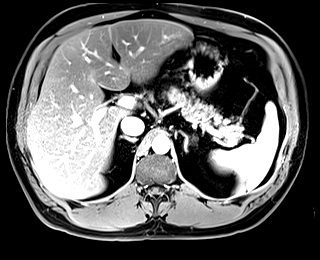
[im 80/80]
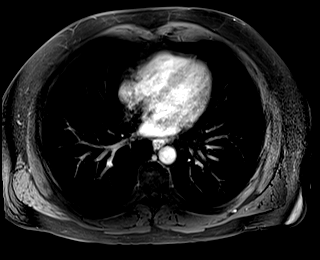

[Series 25: T1 dynamic fat-sat · axial · 3.0mm · 1.25mm/px · z∈[-85,+152]mm · 3 of 80 slices shown (5 of 5)]
[im 1/80]
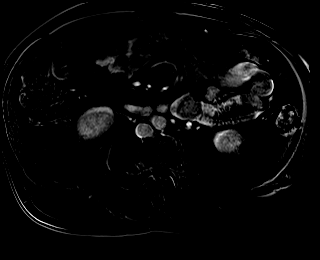
[im 40/80]
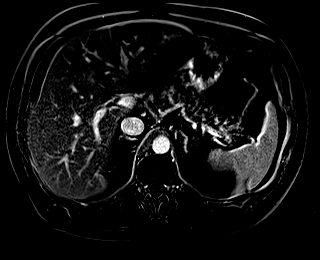
[im 80/80]
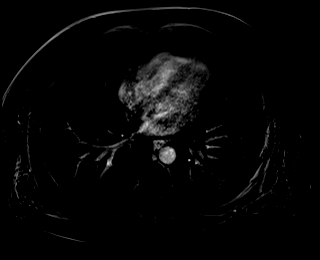

[43 of 48 positions shown; findings below may reference images not displayed]

FINDINGS: Lower chest: Normal heart size without pericardial or pleural
effusion.

Hepatobiliary: Mild hepatomegaly and marked hepatic steatosis.
cm craniocaudal. No focal liver lesion. Normal gallbladder. No intra
or extrahepatic biliary duct dilatation. No choledocholithiasis.

Pancreas: No evidence of acute pancreatitis. No pancreatic duct
dilatation. No pancreas divisum. Pancreas enhances normally.

Spleen:  Normal in size, without focal abnormality.

Adrenals/Urinary Tract: Normal adrenal glands. Normal kidneys,
without hydronephrosis.

Stomach/Bowel: Tiny hiatal hernia.  Normal abdominal bowel loops.

Vascular/Lymphatic: Aortic atherosclerosis. No abdominal adenopathy.

Other:  No ascites.

Musculoskeletal: No acute osseous abnormality.
IMPRESSION: 1. No evidence of acute pancreatitis or other acute abdominal
process. No biliary duct dilatation or choledocholithiasis.
2. Hepatic steatosis and mild hepatomegaly.
3. Tiny hiatal hernia.

## 2022-06-10 ENCOUNTER — Other Ambulatory Visit: Payer: Self-pay | Admitting: Internal Medicine

## 2022-06-15 ENCOUNTER — Other Ambulatory Visit: Payer: Self-pay | Admitting: Internal Medicine

## 2022-06-16 ENCOUNTER — Other Ambulatory Visit: Payer: Self-pay | Admitting: Internal Medicine

## 2022-06-16 ENCOUNTER — Telehealth: Payer: Self-pay | Admitting: Internal Medicine

## 2022-06-17 ENCOUNTER — Other Ambulatory Visit: Payer: Self-pay | Admitting: Internal Medicine

## 2022-06-17 MED ORDER — AMPHETAMINE-DEXTROAMPHETAMINE 10 MG PO TABS: ORAL_TABLET | ORAL | 0 refills | Status: DC

## 2022-06-17 NOTE — Telephone Encounter (Signed)
Rx sent in to [facility]

## 2022-06-17 NOTE — Telephone Encounter (Signed)
Rx sent in for adderal immediat release

## 2022-06-17 NOTE — Telephone Encounter (Signed)
Spoke to patient and informed him that the rx had been sent in to pharmacy

## 2022-06-17 NOTE — Telephone Encounter (Signed)
Please clarify exactly how he is taking his adderall.  Need to confirm if taking extended release in am and regular adderall in pm or exactly how taking.

## 2022-06-17 NOTE — Telephone Encounter (Signed)
[facility] pharmacy called stating pt was expecting '10mg'$  immediate release of the adderall but did not get it

## 2022-06-29 ENCOUNTER — Other Ambulatory Visit: Payer: Self-pay

## 2022-06-29 MED ORDER — DEXCOM G7 SENSOR MISC
3 refills | Status: DC
Start: 2022-06-29 — End: 2023-04-07

## 2022-06-29 MED ORDER — DEXCOM G7 RECEIVER DEVI: 0 refills | Status: DC

## 2022-07-05 ENCOUNTER — Telehealth: Payer: Self-pay | Admitting: *Deleted

## 2022-07-05 ENCOUNTER — Ambulatory Visit (INDEPENDENT_AMBULATORY_CARE_PROVIDER_SITE_OTHER): Payer: 59 | Admitting: Internal Medicine

## 2022-07-05 ENCOUNTER — Encounter: Payer: Self-pay | Admitting: Internal Medicine

## 2022-07-05 DIAGNOSIS — F439 Reaction to severe stress, unspecified: Secondary | ICD-10-CM

## 2022-07-05 DIAGNOSIS — I10 Essential (primary) hypertension: Secondary | ICD-10-CM

## 2022-07-05 DIAGNOSIS — D582 Other hemoglobinopathies: Secondary | ICD-10-CM

## 2022-07-05 DIAGNOSIS — E1165 Type 2 diabetes mellitus with hyperglycemia: Secondary | ICD-10-CM

## 2022-07-05 DIAGNOSIS — J4530 Mild persistent asthma, uncomplicated: Secondary | ICD-10-CM

## 2022-07-05 DIAGNOSIS — E782 Mixed hyperlipidemia: Secondary | ICD-10-CM

## 2022-07-05 DIAGNOSIS — R69 Illness, unspecified: Secondary | ICD-10-CM | POA: Diagnosis not present

## 2022-07-05 DIAGNOSIS — K760 Fatty (change of) liver, not elsewhere classified: Secondary | ICD-10-CM | POA: Diagnosis not present

## 2022-07-05 DIAGNOSIS — F1029 Alcohol dependence with unspecified alcohol-induced disorder: Secondary | ICD-10-CM

## 2022-07-05 LAB — HM DIABETES FOOT EXAM

## 2022-07-05 NOTE — Progress Notes (Signed)
Patient ID: Francisco Jackson, male   DOB: [birth year], 46 y.o.   MRN: [id number]   Subjective:    Patient ID: Francisco Jackson, male    DOB: 1976-07-24, 46 y.o.   MRN: [id number]   Patient here for  Chief Complaint  Patient presents with   Follow-up    Follow up for diabetes check   .   HPI Here to follow up regarding his blood sugar, blood pressure and cholesterol.  Recent covid infection.  No residual problems.  Denies cough, congestion or sob. Recent a1c 7.4.  on metfrmin, jardiance and trulicity.  Jardiance recently increased to 61m q day.  No recorded sugar readings.  Did well with CGM.  F/u with pharmacy regarding PA and coverage.  No chest pain.  No abdominal pain or bowel issues reported.  Feels doing well on adderall.  Request to just have regular adderall.  Discussed diet and exercise.  Feels zoloft is working well.    Past Medical History:  Diagnosis Date   Asthma    Atypical mole 04/05/2013   Left forearm. Atypical combined nevus, moderate atypia.   Diabetes mellitus without complication (HTice    Dysplastic nevus 04/23/2014   Left prox. mid. tricep axilla. Mild atypia, margins free.   Hypertension    Personality disorder (HTamms    borderline   Past Surgical History:  Procedure Laterality Date   ANTERIOR CRUCIATE LIGAMENT REPAIR Right 2006   [facility]   COLONOSCOPY WITH PROPOFOL N/A 11/01/2019   Procedure: COLONOSCOPY WITH PROPOFOL;  Surgeon: AJonathon Bellows MD;  [facility];  Service: Gastroenterology;  Laterality: N/A;   MENISCUS REPAIR Left 1996   Family History  Problem Relation Age of Onset   Diabetes Mother    Melanoma Mother    Diabetes Paternal Aunt    Heart attack Paternal Aunt    Diabetes Paternal Uncle    Diabetes Maternal Grandmother    Diabetes Maternal Grandfather    Heart disease Maternal Grandfather    Heart attack Maternal Grandfather    Heart attack Father    Hypertension Father    Diabetes Sister     Hypertension Sister    Kidney cancer Neg Hx    Kidney disease Neg Hx    Prostate cancer Neg Hx    Social History   Socioeconomic History   Marital status: Married    Spouse name: PNevin Bloodgood  Number of children: Not on file   Years of education: Not on file   Highest education level: Not on file  Occupational History   Occupation: plumber    Comment: owner of plumbing company   Tobacco Use   Smoking status: Former    Packs/day: 0.50    Years: 10.00    Total pack years: 5.00    Types: Cigarettes    Quit date: 10/11/2002    Years since quitting: 19.7   Smokeless tobacco: Former    Types: Snuff    Quit date: 10/11/2009  Vaping Use   Vaping Use: Never used  Substance and Sexual Activity   Alcohol use: Yes    Alcohol/week: 4.0 standard drinks of alcohol    Types: 4 Glasses of wine per week    Comment: ocassional   Drug use: No   Sexual activity: Not on file  Other Topics Concern   Not on file  Social History Narrative   Not on file   Social Determinants of Health   Financial Resource Strain: Low Risk  (  05/11/2021)   Overall Financial Resource Strain (CARDIA)    Difficulty of Paying Living Expenses: Not hard at all  Food Insecurity: No Food Insecurity (05/11/2021)   Hunger Vital Sign    Worried About Running Out of Food in the Last Year: Never true    [place] in the Last Year: Never true  Transportation Needs: No Transportation Needs (05/11/2021)   PRAPARE - Hydrologist (Medical): No    Lack of Transportation (Non-Medical): No  Physical Activity: Not on file  Stress: No Stress Concern Present (05/11/2021)   [place]    Feeling of Stress : Not at all  Social Connections: Not on file     Review of Systems  Constitutional:  Negative for appetite change and unexpected weight change.  HENT:  Negative for congestion and sinus pressure.   Respiratory:  Negative for cough, chest  tightness and shortness of breath.   Cardiovascular:  Negative for chest pain, palpitations and leg swelling.  Gastrointestinal:  Negative for abdominal pain, diarrhea, nausea and vomiting.  Genitourinary:  Negative for difficulty urinating and dysuria.  Musculoskeletal:  Negative for joint swelling and myalgias.  Skin:  Negative for color change and rash.  Neurological:  Negative for dizziness, light-headedness and headaches.  Psychiatric/Behavioral:  Negative for agitation and dysphoric mood.        Objective:     BP 118/82   Pulse 75   Temp 98.2 F (36.8 C) (Oral)   Ht 6' (1.829 m)   Wt 241 lb (109.3 kg)   SpO2 97%   BMI 32.69 kg/m  Wt Readings from Last 3 Encounters:  07/05/22 241 lb (109.3 kg)  06/03/22 228 lb 3.2 oz (103.5 kg)  03/03/22 228 lb 3.2 oz (103.5 kg)    Physical Exam Constitutional:      General: He is not in acute distress.    Appearance: Normal appearance. He is well-developed.  HENT:     Head: Normocephalic and atraumatic.     Right Ear: External ear normal.     Left Ear: External ear normal.  Eyes:     General: No scleral icterus.       Right eye: No discharge.        Left eye: No discharge.  Cardiovascular:     Rate and Rhythm: Normal rate and regular rhythm.  Pulmonary:     Effort: Pulmonary effort is normal. No respiratory distress.     Breath sounds: Normal breath sounds.  Abdominal:     General: Bowel sounds are normal.     Palpations: Abdomen is soft.     Tenderness: There is no abdominal tenderness.  Musculoskeletal:        General: No swelling or tenderness.     Cervical back: Neck supple. No tenderness.  Lymphadenopathy:     Cervical: No cervical adenopathy.  Skin:    Findings: No erythema or rash.  Neurological:     Mental Status: He is alert.  Psychiatric:        Mood and Affect: Mood normal.        Behavior: Behavior normal.      Outpatient Encounter Medications as of 07/05/2022  Medication Sig   amLODipine (NORVASC)  10 MG tablet TAKE 1 TABLET BY MOUTH ONCE DAILY   Continuous Blood Gluc Receiver (DEXCOM G7 RECEIVER) DEVI Use to check blood sugar   Continuous Blood Gluc Sensor (DEXCOM G7 SENSOR) MISC Use  to check blood sugar   Continuous Blood Gluc Sensor (FREESTYLE LIBRE 3 SENSOR) MISC REPLACE SENSOR EVERY 14 DAYS   empagliflozin (JARDIANCE) 25 MG TABS tablet Take 1 tablet (25 mg total) by mouth daily before breakfast.   hydrochlorothiazide (MICROZIDE) 12.5 MG capsule TAKE 1 CAPSULE BY MOUTH ONCE DAILY   Insulin Pen Needle (PEN NEEDLES) 30G X 5 MM MISC Inject 1 Applicatorful into the skin once a week. Use with trulicity pen   lisinopril (ZESTRIL) 5 MG tablet TAKE 1 TABLET BY MOUTH DAILY   metFORMIN (GLUCOPHAGE-XR) 500 MG 24 hr tablet One tablet BID   mometasone (ELOCON) 0.1 % cream Apply to aa's ears BID up to 5d/wk PRN.   montelukast (SINGULAIR) 10 MG tablet TAKE ONE TABLET EVERY MORNING   Multiple Vitamin (MULTIVITAMIN) tablet Take 1 tablet by mouth daily.   rosuvastatin (CRESTOR) 5 MG tablet TAKE 1 TABLET BY MOUTH ONCE DAILY   sertraline (ZOLOFT) 100 MG tablet TAKE 1 TABLET BY MOUTH ONCE DAILY   sildenafil (REVATIO) 20 MG tablet Take 2-4 tablets two hours before intercouse on an empty stomach.  Do not take with nitrates.   [DISCONTINUED] amphetamine-dextroamphetamine (ADDERALL XR) 10 MG 24 hr capsule TAKE ONE CAPSULE BY MOUTH EVERY MORNING   [DISCONTINUED] amphetamine-dextroamphetamine (ADDERALL) 10 MG tablet TAKE 1 TABLET BY MOUTH EVERY EVENING.   [DISCONTINUED] TRULICITY 1.5 PP/5.0DT SOPN INJECT 1.5MG  SUBQ ONCE EVERY 7 DAYS   amphetamine-dextroamphetamine (ADDERALL) 10 MG tablet Take 1 tablet (10 mg total) by mouth 2 (two) times daily.   No facility-administered encounter medications on file as of 07/05/2022.     Lab Results  Component Value Date   WBC 7.7 05/06/2022   HGB 17.3 (H) 05/06/2022   HCT 50.6 05/06/2022   PLT 260.0 05/06/2022   GLUCOSE 147 (H) 05/06/2022   CHOL 145 05/06/2022   TRIG  104.0 05/06/2022   HDL 38.40 (L) 05/06/2022   LDLCALC 86 05/06/2022   ALT 34 05/06/2022   AST 21 05/06/2022   NA 137 05/06/2022   K 4.0 05/06/2022   CL 101 05/06/2022   CREATININE 1.12 05/06/2022   BUN 18 05/06/2022   CO2 28 05/06/2022   TSH 1.53 05/06/2022   PSA 0.21 12/17/2021   HGBA1C 7.4 (H) 05/06/2022   MICROALBUR <0.7 12/17/2021    MR ABDOMEN MRCP W WO CONTAST  Result Date: 07/20/2020 CLINICAL DATA:  Epigastric pain for 1 month. History of pancreatitis. History of ethanol abuse. EXAM: MRI ABDOMEN WITHOUT AND WITH CONTRAST (INCLUDING MRCP) TECHNIQUE: Multiplanar multisequence MR imaging of the abdomen was performed both before and after the administration of intravenous contrast. Heavily T2-weighted images of the biliary and pancreatic ducts were obtained, and three-dimensional MRCP images were rendered by post processing. CONTRAST:  59m GADAVIST GADOBUTROL 1 MMOL/ML IV SOLN field COMPARISON:  Abdominal ultrasound 04/30/2020.  CT 05/02/2020. FINDINGS: Lower chest: Normal heart size without pericardial or pleural effusion. Hepatobiliary: Mild hepatomegaly and marked hepatic steatosis. 18.9 cm craniocaudal. No focal liver lesion. Normal gallbladder. No intra or extrahepatic biliary duct dilatation. No choledocholithiasis. Pancreas: No evidence of acute pancreatitis. No pancreatic duct dilatation. No pancreas divisum. Pancreas enhances normally. Spleen:  Normal in size, without focal abnormality. Adrenals/Urinary Tract: Normal adrenal glands. Normal kidneys, without hydronephrosis. Stomach/Bowel: Tiny hiatal hernia.  Normal abdominal bowel loops. Vascular/Lymphatic: Aortic atherosclerosis. No abdominal adenopathy. Other:  No ascites. Musculoskeletal: No acute osseous abnormality. IMPRESSION: 1. No evidence of acute pancreatitis or other acute abdominal process. No biliary duct dilatation or choledocholithiasis. 2. Hepatic  steatosis and mild hepatomegaly. 3. Tiny hiatal hernia. Electronically  Signed   By: Abigail Miyamoto M.D.   On: 07/20/2020 11:27   MR 3D Recon At Scanner  Result Date: 07/20/2020 CLINICAL DATA:  Epigastric pain for 1 month. History of pancreatitis. History of ethanol abuse. EXAM: MRI ABDOMEN WITHOUT AND WITH CONTRAST (INCLUDING MRCP) TECHNIQUE: Multiplanar multisequence MR imaging of the abdomen was performed both before and after the administration of intravenous contrast. Heavily T2-weighted images of the biliary and pancreatic ducts were obtained, and three-dimensional MRCP images were rendered by post processing. CONTRAST:  36m GADAVIST GADOBUTROL 1 MMOL/ML IV SOLN field COMPARISON:  Abdominal ultrasound 04/30/2020.  CT 05/02/2020. FINDINGS: Lower chest: Normal heart size without pericardial or pleural effusion. Hepatobiliary: Mild hepatomegaly and marked hepatic steatosis. 18.9 cm craniocaudal. No focal liver lesion. Normal gallbladder. No intra or extrahepatic biliary duct dilatation. No choledocholithiasis. Pancreas: No evidence of acute pancreatitis. No pancreatic duct dilatation. No pancreas divisum. Pancreas enhances normally. Spleen:  Normal in size, without focal abnormality. Adrenals/Urinary Tract: Normal adrenal glands. Normal kidneys, without hydronephrosis. Stomach/Bowel: Tiny hiatal hernia.  Normal abdominal bowel loops. Vascular/Lymphatic: Aortic atherosclerosis. No abdominal adenopathy. Other:  No ascites. Musculoskeletal: No acute osseous abnormality. IMPRESSION: 1. No evidence of acute pancreatitis or other acute abdominal process. No biliary duct dilatation or choledocholithiasis. 2. Hepatic steatosis and mild hepatomegaly. 3. Tiny hiatal hernia. Electronically Signed   By: KAbigail MiyamotoM.D.   On: 07/20/2020 11:27       Assessment & Plan:   Problem List Items Addressed This Visit     Asthma, mild persistent    Has seen Dr SDonneta Romberg  Continue singulair, symbicort and astelin.       Diabetes mellitus ([place]    On metformin, jardiance and trulicity. Doing  better since decreasing dose of metformin.  Discussed low carb diet and exercise. Jardiance recently increased to 259mq day.  Follow met b and a1c.  Check on CGM as outlined.       Relevant Orders   Basic metabolic panel   Hemoglobin A1c   Elevated hemoglobin (HCC)    Recheck with next labs.        Relevant Orders   CBC with Differential/Platelet   EtOH dependence ([place]   Has cut down alcohol intake.  Follow.        Hepatic steatosis    Found on abdominal ultrasound.  Diet, exercise. and weight loss.  Follow liver function tests.  Diet and exercise.         Hypertension, essential    Continue hctz and amlodipine.  Blood pressure doing well.  Follow pressures.  Follow metabolic panel.       Mixed hyperlipidemia    On crestor.  Low cholesterol diet and exercise.  Follow lipid panel and liver function tests.        Relevant Orders   Hepatic function panel   Lipid panel   Stress    Reports no increased stress. Overall appears to be handling things relatively well.  Feels adderall helps.  Does not feel needs any further intervention.  Follow.          ChEinar PheasantMD

## 2022-07-05 NOTE — Telephone Encounter (Signed)
Ok to let him know.  Thank you.

## 2022-07-05 NOTE — Telephone Encounter (Signed)
I contacted [facility] about the CGM. They stated that we sent in a new RX for the Dexcom G7 last week & it required a PA. We already got it approved & it is ready for pick up.  If you are okay with this, I will let the patient know as well. Once I hear back from you.

## 2022-07-05 NOTE — Telephone Encounter (Signed)
Pt.notified

## 2022-07-09 ENCOUNTER — Other Ambulatory Visit: Payer: Self-pay | Admitting: Internal Medicine

## 2022-07-11 ENCOUNTER — Encounter: Payer: Self-pay | Admitting: Internal Medicine

## 2022-07-11 DIAGNOSIS — D582 Other hemoglobinopathies: Secondary | ICD-10-CM | POA: Insufficient documentation

## 2022-07-11 MED ORDER — AMPHETAMINE-DEXTROAMPHETAMINE 10 MG PO TABS: 10.0000 mg | ORAL_TABLET | Freq: Two times a day (BID) | ORAL | 0 refills | Status: DC

## 2022-07-11 NOTE — Assessment & Plan Note (Signed)
Has cut down alcohol intake.  Follow.   

## 2022-07-11 NOTE — Assessment & Plan Note (Signed)
Continue hctz and amlodipine.  Blood pressure doing well.  Follow pressures.  Follow metabolic panel.

## 2022-07-11 NOTE — Assessment & Plan Note (Signed)
Has seen Dr Donneta Romberg.  Continue singulair, symbicort and astelin.

## 2022-07-11 NOTE — Assessment & Plan Note (Signed)
On metformin, jardiance and trulicity. Doing better since decreasing dose of metformin.  Discussed low carb diet and exercise. Jardiance recently increased to 34m q day.  Follow met b and a1c.  Check on CGM as outlined.

## 2022-07-11 NOTE — Assessment & Plan Note (Signed)
On crestor.  Low cholesterol diet and exercise.  Follow lipid panel and liver function tests.   

## 2022-07-11 NOTE — Assessment & Plan Note (Signed)
Found on abdominal ultrasound.  Diet, exercise. and weight loss.  Follow liver function tests.  Diet and exercise.

## 2022-07-11 NOTE — Assessment & Plan Note (Signed)
Recheck with next labs.

## 2022-07-11 NOTE — Assessment & Plan Note (Signed)
Reports no increased stress. Overall appears to be handling things relatively well.  Feels adderall helps.  Does not feel needs any further intervention.  Follow.

## 2022-07-20 ENCOUNTER — Telehealth: Payer: Self-pay

## 2022-07-20 NOTE — Telephone Encounter (Signed)
Pt called & notified per Dr. Nicki Reaper that eye exam ws over die & actually last done 01/2021.

## 2022-08-05 ENCOUNTER — Other Ambulatory Visit: Payer: Self-pay | Admitting: Internal Medicine

## 2022-08-12 DIAGNOSIS — H524 Presbyopia: Secondary | ICD-10-CM | POA: Diagnosis not present

## 2022-08-12 LAB — HM DIABETES EYE EXAM

## 2022-08-28 ENCOUNTER — Other Ambulatory Visit: Payer: Self-pay | Admitting: Internal Medicine

## 2022-08-31 NOTE — Telephone Encounter (Signed)
Rx ok'd for adderall.  Needs a f/u appt scheduled.  Needs 3 months after last visit.

## 2022-09-06 ENCOUNTER — Other Ambulatory Visit: Payer: Self-pay | Admitting: Internal Medicine

## 2022-10-08 ENCOUNTER — Other Ambulatory Visit: Payer: Self-pay | Admitting: Internal Medicine

## 2022-11-09 ENCOUNTER — Other Ambulatory Visit: Payer: Self-pay | Admitting: Internal Medicine

## 2022-11-22 ENCOUNTER — Other Ambulatory Visit: Payer: Self-pay | Admitting: Internal Medicine

## 2022-11-22 NOTE — Telephone Encounter (Signed)
Last refill: 10/08/2022   Lov: 07/05/22  Nov: N/A

## 2022-11-22 NOTE — Telephone Encounter (Signed)
Needs a f/u appt scheduled and then I can refill until appt.  Have not seen him since 06/2022.

## 2022-11-24 NOTE — Telephone Encounter (Signed)
Refill sent in for adderall.  Appt scheduled for 11/29/22.

## 2022-11-29 ENCOUNTER — Ambulatory Visit (INDEPENDENT_AMBULATORY_CARE_PROVIDER_SITE_OTHER): Payer: 59 | Admitting: Internal Medicine

## 2022-11-29 ENCOUNTER — Encounter: Payer: Self-pay | Admitting: Internal Medicine

## 2022-11-29 VITALS — BP 124/74 | HR 79 | Temp 98.3°F | Resp 16 | Ht 71.0 in | Wt 236.0 lb

## 2022-11-29 DIAGNOSIS — F909 Attention-deficit hyperactivity disorder, unspecified type: Secondary | ICD-10-CM

## 2022-11-29 DIAGNOSIS — E1165 Type 2 diabetes mellitus with hyperglycemia: Secondary | ICD-10-CM

## 2022-11-29 DIAGNOSIS — D582 Other hemoglobinopathies: Secondary | ICD-10-CM

## 2022-11-29 DIAGNOSIS — K760 Fatty (change of) liver, not elsewhere classified: Secondary | ICD-10-CM | POA: Diagnosis not present

## 2022-11-29 DIAGNOSIS — E782 Mixed hyperlipidemia: Secondary | ICD-10-CM | POA: Diagnosis not present

## 2022-11-29 DIAGNOSIS — R69 Illness, unspecified: Secondary | ICD-10-CM | POA: Diagnosis not present

## 2022-11-29 DIAGNOSIS — I10 Essential (primary) hypertension: Secondary | ICD-10-CM

## 2022-11-29 DIAGNOSIS — R7989 Other specified abnormal findings of blood chemistry: Secondary | ICD-10-CM | POA: Diagnosis not present

## 2022-11-29 DIAGNOSIS — F1029 Alcohol dependence with unspecified alcohol-induced disorder: Secondary | ICD-10-CM

## 2022-11-29 DIAGNOSIS — J4530 Mild persistent asthma, uncomplicated: Secondary | ICD-10-CM

## 2022-11-29 LAB — BASIC METABOLIC PANEL
BUN: 17 mg/dL (ref 6–23)
CO2: 30 mEq/L (ref 19–32)
Calcium: 9.4 mg/dL (ref 8.4–10.5)
Chloride: 101 mEq/L (ref 96–112)
Creatinine, Ser: 1.08 mg/dL (ref 0.40–1.50)
GFR: 81.2 mL/min (ref >60.00)
Glucose, Bld: 150 mg/dL — ABNORMAL HIGH (ref 70–99)
Potassium: 4 mEq/L (ref 3.5–5.1)
Sodium: 137 mEq/L (ref 135–145)

## 2022-11-29 LAB — CBC WITH DIFFERENTIAL/PLATELET
Basophils Absolute: 0.1 10*3/uL (ref 0.0–0.1)
Basophils Relative: 1 % (ref 0.0–3.0)
Eosinophils Absolute: 0.4 10*3/uL (ref 0.0–0.7)
Eosinophils Relative: 6.2 % — ABNORMAL HIGH (ref 0.0–5.0)
HCT: 48.9 % (ref 39.0–52.0)
Hemoglobin: 16.6 g/dL (ref 13.0–17.0)
Lymphocytes Relative: 29.5 % (ref 12.0–46.0)
Lymphs Abs: 1.8 10*3/uL (ref 0.7–4.0)
MCHC: 33.9 g/dL (ref 30.0–36.0)
MCV: 88.3 fl (ref 78.0–100.0)
Monocytes Absolute: 0.5 10*3/uL (ref 0.1–1.0)
Monocytes Relative: 7.9 % (ref 3.0–12.0)
Neutro Abs: 3.4 10*3/uL (ref 1.4–7.7)
Neutrophils Relative %: 55.4 % (ref 43.0–77.0)
Platelets: 292 10*3/uL (ref 150.0–400.0)
RBC: 5.54 Mil/uL (ref 4.22–5.81)
RDW: 12.7 % (ref 11.5–15.5)
WBC: 6.2 10*3/uL (ref 4.0–10.5)

## 2022-11-29 LAB — HEPATIC FUNCTION PANEL
ALT: 30 U/L (ref 0–53)
AST: 18 U/L (ref 0–37)
Albumin: 4.3 g/dL (ref 3.5–5.2)
Alkaline Phosphatase: 62 U/L (ref 39–117)
Bilirubin, Direct: 0.1 mg/dL (ref 0.0–0.3)
Total Bilirubin: 0.5 mg/dL (ref 0.2–1.2)
Total Protein: 6.9 g/dL (ref 6.0–8.3)

## 2022-11-29 LAB — LIPID PANEL
Cholesterol: 134 mg/dL (ref 0–200)
HDL: 36 mg/dL — ABNORMAL LOW (ref >39.00)
LDL Cholesterol: 75 mg/dL (ref 0–99)
NonHDL: 98.46
Total CHOL/HDL Ratio: 4
Triglycerides: 117 mg/dL (ref 0.0–149.0)
VLDL: 23.4 mg/dL (ref 0.0–40.0)

## 2022-11-29 LAB — MICROALBUMIN / CREATININE URINE RATIO
Creatinine,U: 76.5 mg/dL
Microalb Creat Ratio: 0.9 mg/g (ref 0.0–30.0)
Microalb, Ur: <0.7 mg/dL (ref 0.0–1.9)

## 2022-11-29 LAB — HEMOGLOBIN A1C: Hgb A1c MFr Bld: 8.1 % — ABNORMAL HIGH (ref 4.6–6.5)

## 2022-11-29 MED ORDER — AMPHETAMINE-DEXTROAMPHETAMINE 10 MG PO TABS: ORAL_TABLET | ORAL | 0 refills | Status: DC

## 2022-11-29 NOTE — Progress Notes (Unsigned)
Subjective:    Patient ID: VEGAS TRINGALI, male    DOB: 1976-07-07, 47 y.o.   MRN: [id number]  Patient here for  Chief Complaint  Patient presents with   Medical Management of Chronic Issues    HPI Here to follow up regarding his blood sugar, blood pressure and cholesterol. Also here to follow up regarding ADHD.  Has done well on adderall.  Sometimes does not take the pm dose.  Overall feels good.  States he feels better than he has in a long time.  Some nausea in am.  Occasional.  Relates this to whether or not he has eaten at night.  Also feels may be related to taking his metformin on an empty stomach and waiting to eat.  No vomiting.  No abdominal pain.  Bowels ok.  Continues on singulair, symbicort and astelin.  Feels this controls his allergy symptoms and breathing.  No chest pain or sob.  No increased cough or congestion.   Currently on metformin, jardiance and trulicity.   Past Medical History:  Diagnosis Date   Asthma    Atypical mole 04/05/2013   Left forearm. Atypical combined nevus, moderate atypia.   Diabetes mellitus without complication ([place])    Dysplastic nevus 04/23/2014   Left prox. mid. tricep axilla. Mild atypia, margins free.   Hypertension    Personality disorder ([place])    borderline   Past Surgical History:  Procedure Laterality Date   ANTERIOR CRUCIATE LIGAMENT REPAIR Right 2006   CERVICAL [place] SURGERY  2010   COLONOSCOPY WITH PROPOFOL N/A 11/01/2019   Procedure: COLONOSCOPY WITH PROPOFOL;  Surgeon: Jonathon Bellows, MD;  Location: [facility] ENDOSCOPY;  Service: Gastroenterology;  Laterality: N/A;   MENISCUS REPAIR Left 1996   Family History  Problem Relation Age of Onset   Diabetes Mother    Melanoma Mother    Diabetes Paternal Aunt    Heart attack Paternal Aunt    Diabetes Paternal Uncle    Diabetes Maternal Grandmother    Diabetes Maternal Grandfather    Heart disease Maternal Grandfather    Heart attack Maternal Grandfather    Heart attack Father     Hypertension Father    Diabetes Sister    Hypertension Sister    Kidney cancer Neg Hx    Kidney disease Neg Hx    Prostate cancer Neg Hx    Social History   Socioeconomic History   Marital status: Married    Spouse name: Nevin Bloodgood   Number of children: Not on file   Years of education: Not on file   Highest education level: Not on file  Occupational History   Occupation: plumber    Comment: owner of plumbing company   Tobacco Use   Smoking status: Former    Packs/day: 0.50    Years: 10.00    Total pack years: 5.00    Types: Cigarettes    Quit date: 10/11/2002    Years since quitting: 20.1   Smokeless tobacco: Former    Types: Snuff    Quit date: 10/11/2009  Vaping Use   Vaping Use: Never used  Substance and Sexual Activity   Alcohol use: Yes    Alcohol/week: 4.0 standard drinks of alcohol    Types: 4 Glasses of wine per week    Comment: ocassional   Drug use: No   Sexual activity: Not on file  Other Topics Concern   Not on file  Social History Narrative   Not on file   Social Determinants  of Health   Financial Resource Strain: Low Risk  (05/11/2021)   Overall Financial Resource Strain (CARDIA)    Difficulty of Paying Living Expenses: Not hard at all  Food Insecurity: No Food Insecurity (05/11/2021)   Hunger Vital Sign    Worried About Running Out of Food in the Last Year: Never true    [place] in the Last Year: Never true  Transportation Needs: No Transportation Needs (05/11/2021)   PRAPARE - Hydrologist (Medical): No    Lack of Transportation (Non-Medical): No  Physical Activity: Not on file  Stress: No Stress Concern Present (05/11/2021)   [place]    Feeling of Stress : Not at all  Social Connections: Not on file     Review of Systems     Objective:     BP 124/74   Pulse 79   Temp 98.3 F (36.8 C)   Resp 16   Ht 5' 11"$  (1.803 m)   Wt 236 lb (107 kg)    SpO2 98%   BMI 32.92 kg/m  Wt Readings from Last 3 Encounters:  11/29/22 236 lb (107 kg)  07/05/22 241 lb (109.3 kg)  06/03/22 228 lb 3.2 oz (103.5 kg)    Physical Exam   Outpatient Encounter Medications as of 11/29/2022  Medication Sig   amLODipine (NORVASC) 10 MG tablet TAKE 1 TABLET BY MOUTH ONCE DAILY   amphetamine-dextroamphetamine (ADDERALL) 10 MG tablet TAKE ONE (1) TABLET BY MOUTH TWO TIMES PER DAY   Continuous Blood Gluc Receiver (DEXCOM G7 RECEIVER) DEVI Use to check blood sugar   Continuous Blood Gluc Sensor (DEXCOM G7 SENSOR) MISC Use to check blood sugar   hydrochlorothiazide (MICROZIDE) 12.5 MG capsule TAKE 1 CAPSULE BY MOUTH ONCE DAILY   Insulin Pen Needle (PEN NEEDLES) 30G X 5 MM MISC Inject 1 Applicatorful into the skin once a week. Use with trulicity pen   JARDIANCE 25 MG TABS tablet TAKE 1 TABLET BY MOUTH DAILY BEFORE BREAKFAST   lisinopril (ZESTRIL) 5 MG tablet TAKE 1 TABLET BY MOUTH DAILY   metFORMIN (GLUCOPHAGE-XR) 500 MG 24 hr tablet One tablet BID   mometasone (ELOCON) 0.1 % cream Apply to aa's ears BID up to 5d/wk PRN.   montelukast (SINGULAIR) 10 MG tablet TAKE ONE TABLET EVERY MORNING   Multiple Vitamin (MULTIVITAMIN) tablet Take 1 tablet by mouth daily.   rosuvastatin (CRESTOR) 5 MG tablet TAKE 1 TABLET BY MOUTH ONCE DAILY   sertraline (ZOLOFT) 100 MG tablet TAKE 1 TABLET BY MOUTH ONCE DAILY   sildenafil (REVATIO) 20 MG tablet Take 2-4 tablets two hours before intercouse on an empty stomach.  Do not take with nitrates.   TRULICITY 1.5 0000000 SOPN INJECT 1.5MG  SUBQ ONCE EVERY 7 DAYS   [DISCONTINUED] amphetamine-dextroamphetamine (ADDERALL) 10 MG tablet TAKE ONE (1) TABLET BY MOUTH TWO TIMES PER DAY   [DISCONTINUED] Continuous Blood Gluc Sensor (FREESTYLE LIBRE 3 SENSOR) MISC REPLACE SENSOR EVERY 14 DAYS   No facility-administered encounter medications on file as of 11/29/2022.     Lab Results  Component Value Date   WBC 7.7 05/06/2022   HGB 17.3  (H) 05/06/2022   HCT 50.6 05/06/2022   PLT 260.0 05/06/2022   GLUCOSE 147 (H) 05/06/2022   CHOL 145 05/06/2022   TRIG 104.0 05/06/2022   HDL 38.40 (L) 05/06/2022   LDLCALC 86 05/06/2022   ALT 34 05/06/2022   AST 21 05/06/2022  NA 137 05/06/2022   K 4.0 05/06/2022   CL 101 05/06/2022   CREATININE 1.12 05/06/2022   BUN 18 05/06/2022   CO2 28 05/06/2022   TSH 1.53 05/06/2022   PSA 0.21 12/17/2021   HGBA1C 7.4 (H) 05/06/2022   MICROALBUR <0.7 12/17/2021    MR ABDOMEN MRCP W WO CONTAST  Result Date: 07/20/2020 CLINICAL DATA:  Epigastric pain for 1 month. History of pancreatitis. History of ethanol abuse. EXAM: MRI ABDOMEN WITHOUT AND WITH CONTRAST (INCLUDING MRCP) TECHNIQUE: Multiplanar multisequence MR imaging of the abdomen was performed both before and after the administration of intravenous contrast. Heavily T2-weighted images of the biliary and pancreatic ducts were obtained, and three-dimensional MRCP images were rendered by post processing. CONTRAST:  59m GADAVIST GADOBUTROL 1 MMOL/ML IV SOLN field COMPARISON:  Abdominal ultrasound 04/30/2020.  CT 05/02/2020. FINDINGS: Lower chest: Normal heart size without pericardial or pleural effusion. Hepatobiliary: Mild hepatomegaly and marked hepatic steatosis. 18.9 cm craniocaudal. No focal liver lesion. Normal gallbladder. No intra or extrahepatic biliary duct dilatation. No choledocholithiasis. Pancreas: No evidence of acute pancreatitis. No pancreatic duct dilatation. No pancreas divisum. Pancreas enhances normally. Spleen:  Normal in size, without focal abnormality. Adrenals/Urinary Tract: Normal adrenal glands. Normal kidneys, without hydronephrosis. Stomach/Bowel: Tiny hiatal hernia.  Normal abdominal bowel loops. Vascular/Lymphatic: Aortic atherosclerosis. No abdominal adenopathy. Other:  No ascites. Musculoskeletal: No acute osseous abnormality. IMPRESSION: 1. No evidence of acute pancreatitis or other acute abdominal process. No biliary  duct dilatation or choledocholithiasis. 2. Hepatic steatosis and mild hepatomegaly. 3. Tiny hiatal hernia. Electronically Signed   By: KAbigail MiyamotoM.D.   On: 07/20/2020 11:27   MR 3D Recon At Scanner  Result Date: 07/20/2020 CLINICAL DATA:  Epigastric pain for 1 month. History of pancreatitis. History of ethanol abuse. EXAM: MRI ABDOMEN WITHOUT AND WITH CONTRAST (INCLUDING MRCP) TECHNIQUE: Multiplanar multisequence MR imaging of the abdomen was performed both before and after the administration of intravenous contrast. Heavily T2-weighted images of the biliary and pancreatic ducts were obtained, and three-dimensional MRCP images were rendered by post processing. CONTRAST:  943mGADAVIST GADOBUTROL 1 MMOL/ML IV SOLN field COMPARISON:  Abdominal ultrasound 04/30/2020.  CT 05/02/2020. FINDINGS: Lower chest: Normal heart size without pericardial or pleural effusion. Hepatobiliary: Mild hepatomegaly and marked hepatic steatosis. 18.9 cm craniocaudal. No focal liver lesion. Normal gallbladder. No intra or extrahepatic biliary duct dilatation. No choledocholithiasis. Pancreas: No evidence of acute pancreatitis. No pancreatic duct dilatation. No pancreas divisum. Pancreas enhances normally. Spleen:  Normal in size, without focal abnormality. Adrenals/Urinary Tract: Normal adrenal glands. Normal kidneys, without hydronephrosis. Stomach/Bowel: Tiny hiatal hernia.  Normal abdominal bowel loops. Vascular/Lymphatic: Aortic atherosclerosis. No abdominal adenopathy. Other:  No ascites. Musculoskeletal: No acute osseous abnormality. IMPRESSION: 1. No evidence of acute pancreatitis or other acute abdominal process. No biliary duct dilatation or choledocholithiasis. 2. Hepatic steatosis and mild hepatomegaly. 3. Tiny hiatal hernia. Electronically Signed   By: KyAbigail Miyamoto.D.   On: 07/20/2020 11:27       Assessment & Plan:  Type 2 diabetes mellitus with hyperglycemia, without long-term current use of insulin (HCC) -      Microalbumin / creatinine urine ratio -     Hemoglobin A1c -     Basic metabolic panel  Mixed hyperlipidemia -     Lipid panel -     Hepatic function panel  Elevated hemoglobin (HCC) -     CBC with Differential/Platelet  Other orders -     Amphetamine-Dextroamphetamine; TAKE ONE (1) TABLET BY MOUTH  TWO TIMES PER DAY  Dispense: 60 tablet; Refill: 0     Einar Pheasant, MD

## 2022-11-30 ENCOUNTER — Encounter: Payer: Self-pay | Admitting: Internal Medicine

## 2022-11-30 ENCOUNTER — Other Ambulatory Visit: Payer: Self-pay

## 2022-11-30 ENCOUNTER — Telehealth: Payer: Self-pay

## 2022-11-30 DIAGNOSIS — E1165 Type 2 diabetes mellitus with hyperglycemia: Secondary | ICD-10-CM

## 2022-11-30 MED ORDER — TRULICITY 3 MG/0.5ML ~~LOC~~ SOAJ: 3.0000 mg | SUBCUTANEOUS | 2 refills | Status: DC

## 2022-11-30 NOTE — Progress Notes (Signed)
   Care Guide Note  11/30/2022 Name: [name] MRN: [id number] DOB: [birth year]  Referred by: Einar Pheasant, MD Reason for referral : No chief complaint on file.   Francisco Jackson is a 47 y.o. year old male who is a primary care patient of Einar Pheasant, MD. DEMARIE ZEHR was referred to the pharmacist for assistance related to DM.    Successful contact was made with the patient to discuss pharmacy services including being ready for the pharmacist to call at least 5 minutes before the scheduled appointment time, to have medication bottles and any blood sugar or blood pressure readings ready for review. The patient agreed to meet with the pharmacist via with the pharmacist via telephone visit on (date/time).  01/13/2023  Noreene Larsson, [place], [place] [address] Direct Dial: [phone] [email or web address]

## 2022-11-30 NOTE — Assessment & Plan Note (Signed)
Check cbc today 

## 2022-11-30 NOTE — Assessment & Plan Note (Signed)
On metformin, jardiance and trulicity. Bowels do better with lower dose of metformin.  Discussed low carb diet and exercise. Follow met b and a1c.  CGM - not on currently.  Reports am sugars around 130s.  PM 160s.

## 2022-11-30 NOTE — Assessment & Plan Note (Signed)
Has cut down alcohol intake.  Follow.   

## 2022-11-30 NOTE — Assessment & Plan Note (Addendum)
Continue hctz and amlodipine.  Blood pressure as outlined.   Follow pressures.  Follow metabolic panel.

## 2022-11-30 NOTE — Assessment & Plan Note (Signed)
On crestor.  Low cholesterol diet and exercise.  Follow lipid panel and liver function tests.   

## 2022-11-30 NOTE — Assessment & Plan Note (Signed)
Doing well on adderall.  Sometimes does not take evening dose.  Follow.

## 2022-11-30 NOTE — Assessment & Plan Note (Signed)
Keep sugars under control.  Diet, exercise and weight loss. Follow liver function tests.

## 2022-11-30 NOTE — Assessment & Plan Note (Signed)
Has seen Dr Donneta Romberg.  Continue singulair, symbicort and astelin. Doing well on this regimen.

## 2022-11-30 NOTE — Assessment & Plan Note (Signed)
Found on abdominal ultrasound.  Diet, exercise. and weight loss.  Follow liver function tests.

## 2022-12-16 ENCOUNTER — Other Ambulatory Visit: Payer: Self-pay

## 2022-12-16 ENCOUNTER — Telehealth: Payer: Self-pay

## 2022-12-16 MED ORDER — SERTRALINE HCL 100 MG PO TABS: 100.0000 mg | ORAL_TABLET | Freq: Every day | ORAL | 1 refills | Status: DC

## 2022-12-16 NOTE — Telephone Encounter (Signed)
Francisco Jackson called from [facility] to request a refill for sertraline (ZOLOFT) 100 MG tablet.  Francisco Jackson states they did fax a request but she wanted to be sure we received it.  Francisco Jackson states they can receive prescriptions via e-fax.

## 2022-12-16 NOTE — Telephone Encounter (Signed)
[facility]

## 2023-01-10 ENCOUNTER — Other Ambulatory Visit: Payer: Self-pay | Admitting: Internal Medicine

## 2023-01-12 NOTE — Telephone Encounter (Signed)
Rx ok'd for adderall.  Is not scheduled a f/u appt.  Last evaluated in 11/2022.  Will need visit over the next 4-5 weeks.

## 2023-01-13 ENCOUNTER — Other Ambulatory Visit: Payer: 59 | Admitting: Pharmacist

## 2023-01-27 ENCOUNTER — Telehealth: Payer: Self-pay | Admitting: Pharmacist

## 2023-01-27 ENCOUNTER — Other Ambulatory Visit: Payer: 59 | Admitting: Pharmacist

## 2023-01-27 NOTE — Progress Notes (Signed)
Attempted to contact patient for scheduled appointment for medication management. Left HIPAA compliant message for patient to return my call at their convenience.    Catie T. Calayah Guadarrama, PharmD, BCACP, CPP [facility] [phone]  

## 2023-02-02 ENCOUNTER — Other Ambulatory Visit: Payer: Self-pay | Admitting: Internal Medicine

## 2023-02-02 ENCOUNTER — Telehealth: Payer: Self-pay

## 2023-02-02 NOTE — Progress Notes (Signed)
   Care Guide Note  02/02/2023 Name: Francisco Jackson MRN: [id number] DOB: 1976-03-06  Referred by: Dale [place], MD Reason for referral : Care Coordination (Outreach to reschedule f/u with Pharm d )   Francisco Jackson is a 47 y.o. year old male who is a primary care patient of Dale [place], MD. Francisco Jackson was referred to the pharmacist for assistance related to DM.    An unsuccessful telephone outreach was attempted today to contact the patient who was referred to the pharmacy team for assistance with medication management. Additional attempts will be made to contact the patient.   Penne Lash, RMA Care Guide [facility]  [place], [place] [address] Direct Dial: [phone] [email or web address]

## 2023-02-08 ENCOUNTER — Other Ambulatory Visit: Payer: Self-pay | Admitting: Internal Medicine

## 2023-02-09 NOTE — Telephone Encounter (Signed)
Refilled adderall.  Needs f/u appt with me asap overdue.

## 2023-02-15 NOTE — Progress Notes (Signed)
  Care Coordination Note  02/15/2023 Name: MILLIARD BORES MRN: [id number] DOB: 18-Mar-1976  Francisco Jackson is a 47 y.o. year old male who is a primary care patient of Dale Duenweg, MD and is actively engaged with the Chronic Care Management team. I reached out to Berle Mull by phone today to assist with re-scheduling a follow up visit with the Pharmacist  Follow up plan: Unsuccessful telephone outreach attempt made. A HIPAA compliant phone message was left for the patient providing contact information and requesting a return call.  The care management team will reach out to the patient again over the next 7 days.  If patient returns call to provider office, please advise to call CCM Care Guide Penne Lash  at [phone]  Penne Lash, RMA Care Guide [facility]  [place], [place] [address] Direct Dial: [phone] [email or web address]

## 2023-02-22 NOTE — Telephone Encounter (Signed)
Mychart sent to pt.

## 2023-03-01 ENCOUNTER — Other Ambulatory Visit: Payer: 59 | Admitting: Pharmacist

## 2023-03-01 MED ORDER — OZEMPIC (0.25 OR 0.5 MG/DOSE) 2 MG/3ML ~~LOC~~ SOPN: 0.5000 mg | PEN_INJECTOR | SUBCUTANEOUS | 2 refills | Status: DC

## 2023-03-01 NOTE — Progress Notes (Signed)
03/01/2023 Name: Francisco Jackson MRN: [id number] DOB: 1976-03-08  Chief Complaint  Patient presents with   Medication Management   Diabetes   Hypertension   SERJIO Jackson is a 47 y.o. year old male who presented for a telephone visit.   They were referred to the pharmacist by their PCP for assistance in managing diabetes.   Subjective:  Care Team: Primary Care Provider: Dale [place], MD ; Next Scheduled Visit: needs to schedule   Medication Access/Adherence  Current Pharmacy:  [facility] - [place], [place] - [address]enee Harder [place] [address] Phone: [phone] Fax: [phone]   Patient reports affordability concerns with their medications: No  Patient reports access/transportation concerns to their pharmacy: Yes  Patient reports adherence concerns with their medications:  Yes  \  Reports he has been unable to get Trulicity from [facility]  Notes he is very focused on improving his health. Notes his son is a resident at [facility] Medicine residency.   Diabetes:  Current medications: metformin XR 500 mg twice daily, Trulicity 3 mg weekly, Jardiance 25 mg daily  Notes significant stomach upset, sometimes vomiting, he relates to metformin therapy. Requests to discontinue.   Current glucose readings: notes he has not been checking readings lately, but did but DexCom back on today  Patient denies hypoglycemic s/sx including dizziness, shakiness, sweating.   Current meal patterns:  - Notes he is working on cutting back on sodas, sweet teas, drinks - Switching to english muffin, grilled chicken.    Hypertension:  Current medications: amlodipine 10 mg daily, lisinopril 5 mg daily, HCTZ 12.5 mg daily    Hyperlipidemia/ASCVD Risk Reduction  Current lipid lowering medications: rosuvastatin 5 mg daily    Anxiety: Patient reports he discontinued sertraline (after a taper) as it made him feel "zoned out". Reports his mental  health is fairly well controlled right now. Notes if he takes his Adderall, he feels focused and less anxious.   Objective:  Lab Results  Component Value Date   HGBA1C 8.1 (H) 11/29/2022    Lab Results  Component Value Date   CREATININE 1.08 11/29/2022   BUN 17 11/29/2022   NA 137 11/29/2022   K 4.0 11/29/2022   CL 101 11/29/2022   CO2 30 11/29/2022    Lab Results  Component Value Date   CHOL 134 11/29/2022   HDL 36.00 (L) 11/29/2022   LDLCALC 75 11/29/2022   TRIG 117.0 11/29/2022   CHOLHDL 4 11/29/2022    Medications Reviewed Today     Reviewed by Alden Hipp, RPH-CPP (Pharmacist) on 03/01/23 at 0913  Med List Status: <None>   Medication Order Taking? Sig Documenting Provider Last Dose Status Informant  amLODipine (NORVASC) 10 MG tablet [id number] Yes TAKE 1 TABLET BY MOUTH ONCE DAILY Dale Oakley, MD Taking Active   amphetamine-dextroamphetamine (ADDERALL) 10 MG tablet [id number]  TAKE ONE TABLET BY MOUTH TWICE DAILY Dale Sigurd, MD  Active   Continuous Blood Gluc Sensor (DEXCOM G7 SENSOR) MISC [id number] Yes Use to check blood sugar Dale Gallia, MD Taking Active   Dulaglutide (TRULICITY) 3 MG/0.5ML SOPN [id number] No Inject 3 mg as directed once a week.  Patient not taking: Reported on 03/01/2023   Dale Why, MD Not Taking Active   hydrochlorothiazide (MICROZIDE) 12.5 MG capsule [id number] Yes TAKE 1 CAPSULE BY MOUTH ONCE DAILY Dale , MD Taking Active   JARDIANCE 25 MG TABS tablet [id number] Yes TAKE 1 TABLET BY MOUTH DAILY BEFORE  Earnstine Regal, Westley Hummer, MD Taking Active   lisinopril (ZESTRIL) 5 MG tablet [id number] Yes TAKE 1 TABLET BY MOUTH DAILY Dale [place], MD Taking Active   metFORMIN (GLUCOPHAGE-XR) 500 MG 24 hr tablet [id number] Yes TAKE ONE (1) TABLET BY MOUTH TWO TIMES PER DAY Dale [place], MD Taking Active   mometasone (ELOCON) 0.1 % cream [id number]  Apply to aa's ears BID up to 5d/wk PRN. Deirdre Evener, MD  Active    montelukast (SINGULAIR) 10 MG tablet [id number] Yes TAKE ONE TABLET EVERY MORNING Dale Duck, MD Taking Active   Multiple Vitamin (MULTIVITAMIN) tablet [id number]  Take 1 tablet by mouth daily. [provider]  Active   rosuvastatin (CRESTOR) 5 MG tablet [id number] Yes TAKE 1 TABLET BY MOUTH ONCE DAILY Dale [place], MD Taking Active   sertraline (ZOLOFT) 100 MG tablet [id number] No Take 1 tablet (100 mg total) by mouth daily.  Patient not taking: Reported on 03/01/2023   Dale Mingo, MD Not Taking Active   sildenafil (REVATIO) 20 MG tablet [id number]  Take 2-4 tablets two hours before intercouse on an empty stomach.  Do not take with nitrates. Dale [place], MD  Active               Assessment/Plan:   Diabetes: - Currently uncontrolled - Reviewed long term cardiovascular and renal outcomes of uncontrolled blood sugar - Reviewed goal A1c, goal fasting, and goal 2 hour post prandial glucose - Reviewed dietary modifications including praised goal to focus on reducing carbohydrates.  - Recommend to stop Trulicity, start Ozempic given Trulicity back orders, and increased potency. Per patient request, will stop metformin to see if improvement in GI symptoms.  - Recommend to check glucose continuously using CGM. Will send instructions on how to connect to my portal. Continue Jardiance 25 mg daily  Hypertension: - Currently controlled - Reviewed appropriate blood pressure monitoring technique and reviewed goal blood pressure. Recommended to check home blood pressure and heart rate periodically - Recommend to continue current regimen.   Hyperlipidemia/ASCVD Risk Reduction: - Currently controlled.  - Recommend to continue current regimen at this time  Anxiety: - Currently relatively well controlled per patient report.  - Recommend to continue current regimen, discuss with PCP at upcoming visit   Follow Up Plan: phone call in 4 weeks. Assisted in scheduling with PCP    Catie Eppie Gibson, PharmD, BCACP, CPP [facility] [phone]

## 2023-03-01 NOTE — Patient Instructions (Addendum)
Duaine,   It was great talking to you today!  When you get Ozempic, start at 0.25 mg weekly for 2 weeks, then increase to 0.5 mg weekly.   Stop metformin. Continue Jardiance 25 mg daily.   - Download the DexCom Clarity App - Log in with your DexCom account information.  - When prompted, enter our clinic ID "[id number]"   That will connect your readings to my practice portal so I can see everything when we have visits.   Please reach out with any questions or concerns.   Thanks!  Catie Clearance Coots, PharmD [phone]

## 2023-03-02 ENCOUNTER — Other Ambulatory Visit: Payer: Self-pay | Admitting: Internal Medicine

## 2023-03-11 ENCOUNTER — Encounter: Payer: Self-pay | Admitting: Internal Medicine

## 2023-03-11 ENCOUNTER — Telehealth (INDEPENDENT_AMBULATORY_CARE_PROVIDER_SITE_OTHER): Payer: 59 | Admitting: Internal Medicine

## 2023-03-11 DIAGNOSIS — J4530 Mild persistent asthma, uncomplicated: Secondary | ICD-10-CM | POA: Diagnosis not present

## 2023-03-11 DIAGNOSIS — E1165 Type 2 diabetes mellitus with hyperglycemia: Secondary | ICD-10-CM

## 2023-03-11 DIAGNOSIS — E782 Mixed hyperlipidemia: Secondary | ICD-10-CM | POA: Diagnosis not present

## 2023-03-11 DIAGNOSIS — F1029 Alcohol dependence with unspecified alcohol-induced disorder: Secondary | ICD-10-CM | POA: Diagnosis not present

## 2023-03-11 DIAGNOSIS — Z125 Encounter for screening for malignant neoplasm of prostate: Secondary | ICD-10-CM

## 2023-03-11 DIAGNOSIS — F909 Attention-deficit hyperactivity disorder, unspecified type: Secondary | ICD-10-CM | POA: Diagnosis not present

## 2023-03-11 DIAGNOSIS — I10 Essential (primary) hypertension: Secondary | ICD-10-CM | POA: Diagnosis not present

## 2023-03-11 DIAGNOSIS — F439 Reaction to severe stress, unspecified: Secondary | ICD-10-CM

## 2023-03-11 DIAGNOSIS — R7989 Other specified abnormal findings of blood chemistry: Secondary | ICD-10-CM

## 2023-03-11 MED ORDER — AMPHETAMINE-DEXTROAMPHETAMINE 10 MG PO TABS: 10.0000 mg | ORAL_TABLET | Freq: Two times a day (BID) | ORAL | 0 refills | Status: DC

## 2023-03-11 MED ORDER — BUSPIRONE HCL 5 MG PO TABS
5.0000 mg | ORAL_TABLET | Freq: Two times a day (BID) | ORAL | 0 refills | Status: DC | PRN
Start: 2023-03-11 — End: 2023-04-01

## 2023-03-11 NOTE — Progress Notes (Unsigned)
Subjective:    Patient ID: Francisco Jackson, male    DOB: 15-Jul-1976, 47 y.o.   MRN: [id number]  Patient here for  Chief Complaint  Patient presents with   Medical Management of Chronic Issues    HPI Here to follow up regarding his blood sugar, blood pressure and cholesterol. Also here to follow up regarding ADHD. Last visit - A1c increased 8.1.  trulicity was increased - last visit.  Has been unable to get trulicity.  Changed to ozempic.  Stopped metformin recently due to GI side effects.  Continues on jardiance.   Continues on singulair, symbicort and astelin.  Feels this controls his allergy symptoms and breathing.   Past Medical History:  Diagnosis Date   Asthma    Atypical mole 04/05/2013   Left forearm. Atypical combined nevus, moderate atypia.   Diabetes mellitus without complication (HCC)    Dysplastic nevus 04/23/2014   Left prox. mid. tricep axilla. Mild atypia, margins free.   Hypertension    Personality disorder (HCC)    borderline   Past Surgical History:  Procedure Laterality Date   ANTERIOR CRUCIATE LIGAMENT REPAIR Right 2006   CERVICAL DISC SURGERY  2010   COLONOSCOPY WITH PROPOFOL N/A 11/01/2019   Procedure: COLONOSCOPY WITH PROPOFOL;  Surgeon: Wyline Mood, MD;  Location: [facility] ENDOSCOPY;  Service: Gastroenterology;  Laterality: N/A;   MENISCUS REPAIR Left 1996   Family History  Problem Relation Age of Onset   Diabetes Mother    Melanoma Mother    Diabetes Paternal Aunt    Heart attack Paternal Aunt    Diabetes Paternal Uncle    Diabetes Maternal Grandmother    Diabetes Maternal Grandfather    Heart disease Maternal Grandfather    Heart attack Maternal Grandfather    Heart attack Father    Hypertension Father    Diabetes Sister    Hypertension Sister    Kidney cancer Neg Hx    Kidney disease Neg Hx    Prostate cancer Neg Hx    Social History   Socioeconomic History   Marital status: Married    Spouse name: Gunnar Fusi   Number of children: Not on  file   Years of education: Not on file   Highest education level: Associate degree: occupational, Scientist, product/process development, or vocational program  Occupational History   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   Tobacco Use   Smoking status: Former    Packs/day: 0.50    Years: 10.00    Additional pack years: 0.00    Total pack years: 5.00    Types: Cigarettes    Quit date: 10/11/2002    Years since quitting: 20.4   Smokeless tobacco: Former    Types: Snuff    Quit date: 10/11/2009  Vaping Use   Vaping Use: Never used  Substance and Sexual Activity   Alcohol use: Yes    Alcohol/week: 4.0 standard drinks of alcohol    Types: 4 Glasses of wine per week    Comment: ocassional   Drug use: No   Sexual activity: Not on file  Other Topics Concern   Not on file  Social History Narrative   Not on file   Social Determinants of Health   Financial Resource Strain: Low Risk  (03/10/2023)   Overall Financial Resource Strain (CARDIA)    Difficulty of Paying Living Expenses: Not hard at all  Food Insecurity: No Food Insecurity (03/10/2023)   Hunger Vital Sign    Worried About Running  Out of Food in the Last Year: Never true    Ran Out of Food in the Last Year: Never true  Transportation Needs: No Transportation Needs (03/10/2023)   PRAPARE - Administrator, Civil Service (Medical): No    Lack of Transportation (Non-Medical): No  Physical Activity: Insufficiently Active (03/10/2023)   Exercise Vital Sign    Days of Exercise per Week: 3 days    Minutes of Exercise per Session: 30 min  Stress: No Stress Concern Present (03/10/2023)   [employer] of Occupational Health - Occupational Stress Questionnaire    Feeling of Stress : Only a little  Social Connections: Socially Integrated (03/10/2023)   Social Connection and Isolation Panel [NHANES]    Frequency of Communication with Friends and Family: More than three times a week    Frequency of Social Gatherings with Friends  and Family: More than three times a week    Attends Religious Services: More than 4 times per year    Active Member of [employer] or Organizations: Yes    Attends Engineer, structural: More than 4 times per year    Marital Status: Married     Review of Systems     Objective:     There were no vitals taken for this visit. Wt Readings from Last 3 Encounters:  11/29/22 236 lb (107 kg)  07/05/22 241 lb (109.3 kg)  06/03/22 228 lb 3.2 oz (103.5 kg)    Physical Exam   Outpatient Encounter Medications as of 03/11/2023  Medication Sig   amLODipine (NORVASC) 10 MG tablet TAKE 1 TABLET BY MOUTH ONCE DAILY   amphetamine-dextroamphetamine (ADDERALL) 10 MG tablet TAKE ONE TABLET BY MOUTH TWICE DAILY   Continuous Blood Gluc Sensor (DEXCOM G7 SENSOR) MISC Use to check blood sugar   hydrochlorothiazide (MICROZIDE) 12.5 MG capsule TAKE 1 CAPSULE BY MOUTH ONCE DAILY   JARDIANCE 25 MG TABS tablet TAKE 1 TABLET BY MOUTH DAILY BEFORE BREAKFAST   lisinopril (ZESTRIL) 5 MG tablet TAKE 1 TABLET BY MOUTH DAILY   mometasone (ELOCON) 0.1 % cream Apply to aa's ears BID up to 5d/wk PRN.   montelukast (SINGULAIR) 10 MG tablet TAKE ONE TABLET EVERY MORNING   Multiple Vitamin (MULTIVITAMIN) tablet Take 1 tablet by mouth daily.   rosuvastatin (CRESTOR) 5 MG tablet TAKE 1 TABLET BY MOUTH ONCE DAILY   Semaglutide,0.25 or 0.5MG /DOS, (OZEMPIC, 0.25 OR 0.5 MG/DOSE,) 2 MG/3ML SOPN Inject 0.5 mg into the skin once a week.   sildenafil (REVATIO) 20 MG tablet Take 2-4 tablets two hours before intercouse on an empty stomach.  Do not take with nitrates.   [DISCONTINUED] sertraline (ZOLOFT) 100 MG tablet Take 1 tablet (100 mg total) by mouth daily. (Patient not taking: Reported on 03/01/2023)   No facility-administered encounter medications on file as of 03/11/2023.     Lab Results  Component Value Date   WBC 6.2 11/29/2022   HGB 16.6 11/29/2022   HCT 48.9 11/29/2022   PLT 292.0 11/29/2022   GLUCOSE 150 (H)  11/29/2022   CHOL 134 11/29/2022   TRIG 117.0 11/29/2022   HDL 36.00 (L) 11/29/2022   LDLCALC 75 11/29/2022   ALT 30 11/29/2022   AST 18 11/29/2022   NA 137 11/29/2022   K 4.0 11/29/2022   CL 101 11/29/2022   CREATININE 1.08 11/29/2022   BUN 17 11/29/2022   CO2 30 11/29/2022   TSH 1.53 05/06/2022   PSA 0.21 12/17/2021   HGBA1C 8.1 (H) 11/29/2022  MICROALBUR <0.7 11/29/2022    MR ABDOMEN MRCP W WO CONTAST  Result Date: 07/20/2020 CLINICAL DATA:  Epigastric pain for 1 month. History of pancreatitis. History of ethanol abuse. EXAM: MRI ABDOMEN WITHOUT AND WITH CONTRAST (INCLUDING MRCP) TECHNIQUE: Multiplanar multisequence MR imaging of the abdomen was performed both before and after the administration of intravenous contrast. Heavily T2-weighted images of the biliary and pancreatic ducts were obtained, and three-dimensional MRCP images were rendered by post processing. CONTRAST:  9mL GADAVIST GADOBUTROL 1 MMOL/ML IV SOLN field COMPARISON:  Abdominal ultrasound 04/30/2020.  CT 05/02/2020. FINDINGS: Lower chest: Normal heart size without pericardial or pleural effusion. Hepatobiliary: Mild hepatomegaly and marked hepatic steatosis. 18.9 cm craniocaudal. No focal liver lesion. Normal gallbladder. No intra or extrahepatic biliary duct dilatation. No choledocholithiasis. Pancreas: No evidence of acute pancreatitis. No pancreatic duct dilatation. No pancreas divisum. Pancreas enhances normally. Spleen:  Normal in size, without focal abnormality. Adrenals/Urinary Tract: Normal adrenal glands. Normal kidneys, without hydronephrosis. Stomach/Bowel: Tiny hiatal hernia.  Normal abdominal bowel loops. Vascular/Lymphatic: Aortic atherosclerosis. No abdominal adenopathy. Other:  No ascites. Musculoskeletal: No acute osseous abnormality. IMPRESSION: 1. No evidence of acute pancreatitis or other acute abdominal process. No biliary duct dilatation or choledocholithiasis. 2. Hepatic steatosis and mild  hepatomegaly. 3. Tiny hiatal hernia. Electronically Signed   By: Jeronimo Greaves M.D.   On: 07/20/2020 11:27   MR 3D Recon At Scanner  Result Date: 07/20/2020 CLINICAL DATA:  Epigastric pain for 1 month. History of pancreatitis. History of ethanol abuse. EXAM: MRI ABDOMEN WITHOUT AND WITH CONTRAST (INCLUDING MRCP) TECHNIQUE: Multiplanar multisequence MR imaging of the abdomen was performed both before and after the administration of intravenous contrast. Heavily T2-weighted images of the biliary and pancreatic ducts were obtained, and three-dimensional MRCP images were rendered by post processing. CONTRAST:  9mL GADAVIST GADOBUTROL 1 MMOL/ML IV SOLN field COMPARISON:  Abdominal ultrasound 04/30/2020.  CT 05/02/2020. FINDINGS: Lower chest: Normal heart size without pericardial or pleural effusion. Hepatobiliary: Mild hepatomegaly and marked hepatic steatosis. 18.9 cm craniocaudal. No focal liver lesion. Normal gallbladder. No intra or extrahepatic biliary duct dilatation. No choledocholithiasis. Pancreas: No evidence of acute pancreatitis. No pancreatic duct dilatation. No pancreas divisum. Pancreas enhances normally. Spleen:  Normal in size, without focal abnormality. Adrenals/Urinary Tract: Normal adrenal glands. Normal kidneys, without hydronephrosis. Stomach/Bowel: Tiny hiatal hernia.  Normal abdominal bowel loops. Vascular/Lymphatic: Aortic atherosclerosis. No abdominal adenopathy. Other:  No ascites. Musculoskeletal: No acute osseous abnormality. IMPRESSION: 1. No evidence of acute pancreatitis or other acute abdominal process. No biliary duct dilatation or choledocholithiasis. 2. Hepatic steatosis and mild hepatomegaly. 3. Tiny hiatal hernia. Electronically Signed   By: Jeronimo Greaves M.D.   On: 07/20/2020 11:27       Assessment & Plan:  Type 2 diabetes mellitus with hyperglycemia, without long-term current use of insulin (HCC)  Attention deficit hyperactivity disorder (ADHD), unspecified ADHD  type  Hypertension, essential  Mixed hyperlipidemia  Prostate cancer screening     Dale Baywood, MD

## 2023-03-12 ENCOUNTER — Encounter: Payer: Self-pay | Admitting: Internal Medicine

## 2023-03-12 NOTE — Assessment & Plan Note (Signed)
Has seen Dr [place] Callas.  Continue singulair, symbicort and astelin.  No breathing problems reported.

## 2023-03-12 NOTE — Progress Notes (Signed)
Patient ID: Francisco Jackson, male   DOB: 1976/04/18, 47 y.o.   MRN: [id number]   Virtual Visit via virtual Note   All issues noted in this document were discussed and addressed.  No physical exam was performed (except for noted visual exam findings with Video Visits).   I connected with Francisco Jackson by a video enabled telemedicine application and verified that I am speaking with the correct person using two identifiers. Location patient: home Location provider: work  Persons participating in the virtual visit: patient, provider  The limitations, risks, security and privacy concerns of performing an evaluation and management service by video and the availability of in person appointments have been discussed.  It has also been discussed with the patient that there may be a patient responsible charge related to this service. The patient expressed understanding and agreed to proceed.   Reason for visit: follow up appt  HPI: Follow up regarding his blood sugar, blood pressure and cholesterol. Also follow up regarding ADHD. Feels the adderall is doing relatively well.  He is off zoloft. Overall feels he is doing well off the zoloft.  Does not feel needs to restart. Does feel needs something to take as needed.  Discussed treatment options.  Also last visit - A1c increased 8.1.  Trulicity was increased - last visit.  Has been unable to get trulicity.  Changed to ozempic.  Stopped metformin recently due to GI side effects.  GI symptoms much improved.  Continues on jardiance.  Has CGM.  Not currently wearing - was pulled off. Plans to replace.  States sugars are averagig 170-180.  Discussed diet and exercise.  Continues on singulair, symbicort and astelin.  Seems to control his allergy symptoms and breathing. He did have - chin - injury.  Seeing dentist and scheduled to see Dr Francisco Jackson.     ROS: See pertinent positives and negatives per HPI.  Past Medical History:  Diagnosis Date   Asthma    Atypical  mole 04/05/2013   Left forearm. Atypical combined nevus, moderate atypia.   Diabetes mellitus without complication (HCC)    Dysplastic nevus 04/23/2014   Left prox. mid. tricep axilla. Mild atypia, margins free.   Hypertension    Personality disorder (HCC)    borderline    Past Surgical History:  Procedure Laterality Date   ANTERIOR CRUCIATE LIGAMENT REPAIR Right 2006   CERVICAL DISC SURGERY  2010   COLONOSCOPY WITH PROPOFOL N/A 11/01/2019   Procedure: COLONOSCOPY WITH PROPOFOL;  Surgeon: Francisco Mood, MD;  Location: [facility] ENDOSCOPY;  Service: Gastroenterology;  Laterality: N/A;   MENISCUS REPAIR Left 1996    Family History  Problem Relation Age of Onset   Diabetes Mother    Melanoma Mother    Diabetes Paternal Aunt    Heart attack Paternal Aunt    Diabetes Paternal Uncle    Diabetes Maternal Grandmother    Diabetes Maternal Grandfather    Heart disease Maternal Grandfather    Heart attack Maternal Grandfather    Heart attack Father    Hypertension Father    Diabetes Sister    Hypertension Sister    Kidney cancer Neg Hx    Kidney disease Neg Hx    Prostate cancer Neg Hx     SOCIAL HX: reviewed.    Current Outpatient Medications:    busPIRone (BUSPAR) 5 MG tablet, Take 1 tablet (5 mg total) by mouth 2 (two) times daily as needed., Disp: 30 tablet, Rfl: 0   amLODipine (NORVASC) 10 MG  tablet, TAKE 1 TABLET BY MOUTH ONCE DAILY, Disp: 90 tablet, Rfl: 1   amphetamine-dextroamphetamine (ADDERALL) 10 MG tablet, Take 1 tablet (10 mg total) by mouth 2 (two) times daily., Disp: 60 tablet, Rfl: 0   Continuous Blood Gluc Sensor (DEXCOM G7 SENSOR) MISC, Use to check blood sugar, Disp: 3 each, Rfl: 3   hydrochlorothiazide (MICROZIDE) 12.5 MG capsule, TAKE 1 CAPSULE BY MOUTH ONCE DAILY, Disp: 90 capsule, Rfl: 1   JARDIANCE 25 MG TABS tablet, TAKE 1 TABLET BY MOUTH DAILY BEFORE BREAKFAST, Disp: 30 tablet, Rfl: 2   lisinopril (ZESTRIL) 5 MG tablet, TAKE 1 TABLET BY MOUTH DAILY, Disp: 90  tablet, Rfl: 1   mometasone (ELOCON) 0.1 % cream, Apply to aa's ears BID up to 5d/wk PRN., Disp: 15 g, Rfl: 1   montelukast (SINGULAIR) 10 MG tablet, TAKE ONE TABLET EVERY MORNING, Disp: 30 tablet, Rfl: 0   Multiple Vitamin (MULTIVITAMIN) tablet, Take 1 tablet by mouth daily., Disp: , Rfl:    rosuvastatin (CRESTOR) 5 MG tablet, TAKE 1 TABLET BY MOUTH ONCE DAILY, Disp: 90 tablet, Rfl: 3   Semaglutide,0.25 or 0.5MG /DOS, (OZEMPIC, 0.25 OR 0.5 MG/DOSE,) 2 MG/3ML SOPN, Inject 0.5 mg into the skin once a week., Disp: 3 mL, Rfl: 2   sildenafil (REVATIO) 20 MG tablet, Take 2-4 tablets two hours before intercouse on an empty stomach.  Do not take with nitrates., Disp: 30 tablet, Rfl: 1  EXAM:  GENERAL: alert, oriented, appears well and in no acute distress  HEENT: atraumatic, conjunttiva clear, no obvious abnormalities on inspection of external nose and ears  NECK: normal movements of the head and neck  LUNGS: on inspection no signs of respiratory distress, breathing rate appears normal, no obvious gross SOB, gasping or wheezing  CV: no obvious cyanosis  PSYCH/NEURO: pleasant and cooperative, no obvious depression or anxiety, speech and thought processing grossly intact  ASSESSMENT AND PLAN:  Discussed the following assessment and plan:  Problem List Items Addressed This Visit     Stress    Overall appears to be handling things relatively well.  Feels adderall helps.  Discussed.  Does feel needs something to take prn.  Discussed treatment options.  Avoid benzodiazepines.  Discussed possible trial of hydroxyzine vs buspar.  Trial of buspar.  Also discussed could take on a regular basis if needed. Follow.        Mixed hyperlipidemia    On crestor.  Low cholesterol diet and exercise.  Follow lipid panel and liver function tests.        Relevant Orders   Hepatic function panel   CBC with Differential/Platelet   Lipid panel   Hypertension, essential    Continue hctz and amlodipine.  Follow  pressures.  Follow metabolic panel.       EtOH dependence (HCC)    Has cut down alcohol intake.  Follow.        Diabetes mellitus (HCC) - Primary    Last visit - A1c increased 8.1.  Trulicity was increased - last visit.  Has been unable to get trulicity.  Changed to ozempic.  On .5mg  now and tolerating.  Stopped metformin recently due to GI side effects.  GI symptoms much improved.  Continues on jardiance.  Has CGM.  Not currently wearing - was pulled off. Plans to replace.  States sugars are averagig 170-180.  Discussed diet and exercise.  Just started ozempic. Hold on making adjustments.  Continue diet and exercise.  Monitor sugars and send in readings.  Follow.  Relevant Orders   Basic metabolic panel   Hemoglobin A1c   Asthma, mild persistent    Has seen Dr [place] Callas.  Continue singulair, symbicort and astelin.  No breathing problems reported.        ADHD    Has done well on adderall.  Sometimes does not take evening dose.  Off zoloft.  Some increased stress.  Treat as outlined.  Continue adderall at current dosing.       Abnormal liver function tests    Keep sugars under control.  Diet, exercise and weight loss. Follow liver function tests.        Other Visit Diagnoses     Prostate cancer screening       Relevant Orders   PSA       Return in about 3 months (around 06/11/2023) for physical.   I discussed the assessment and treatment plan with the patient. The patient was provided an opportunity to ask questions and all were answered. The patient agreed with the plan and demonstrated an understanding of the instructions.   The patient was advised to call back or seek an in-person evaluation if the symptoms worsen or if the condition fails to improve as anticipated.    Dale Vinton, MD

## 2023-03-12 NOTE — Assessment & Plan Note (Signed)
Continue hctz and amlodipine.  Follow pressures.  Follow metabolic panel.  

## 2023-03-12 NOTE — Assessment & Plan Note (Signed)
On crestor.  Low cholesterol diet and exercise.  Follow lipid panel and liver function tests.   

## 2023-03-12 NOTE — Assessment & Plan Note (Signed)
Overall appears to be handling things relatively well.  Feels adderall helps.  Discussed.  Does feel needs something to take prn.  Discussed treatment options.  Avoid benzodiazepines.  Discussed possible trial of hydroxyzine vs buspar.  Trial of buspar.  Also discussed could take on a regular basis if needed. Follow.

## 2023-03-12 NOTE — Assessment & Plan Note (Signed)
Has done well on adderall.  Sometimes does not take evening dose.  Off zoloft.  Some increased stress.  Treat as outlined.  Continue adderall at current dosing.

## 2023-03-12 NOTE — Assessment & Plan Note (Signed)
Keep sugars under control.  Diet, exercise and weight loss. Follow liver function tests.   

## 2023-03-12 NOTE — Assessment & Plan Note (Signed)
Last visit - A1c increased 8.1.  Trulicity was increased - last visit.  Has been unable to get trulicity.  Changed to ozempic.  On .5mg  now and tolerating.  Stopped metformin recently due to GI side effects.  GI symptoms much improved.  Continues on jardiance.  Has CGM.  Not currently wearing - was pulled off. Plans to replace.  States sugars are averagig 170-180.  Discussed diet and exercise.  Just started ozempic. Hold on making adjustments.  Continue diet and exercise.  Monitor sugars and send in readings.  Follow.

## 2023-03-12 NOTE — Assessment & Plan Note (Signed)
Has cut down alcohol intake.  Follow.   

## 2023-03-14 ENCOUNTER — Telehealth: Payer: Self-pay

## 2023-03-14 NOTE — Telephone Encounter (Signed)
I left voicemail for patient asking him to please call us back to schedule the follow-up appointments from his virtual visit with Dr. Dale [place] on 03/11/2023.

## 2023-03-16 NOTE — Telephone Encounter (Signed)
Per chart review- patient has scheduled appts.

## 2023-03-18 ENCOUNTER — Other Ambulatory Visit: Payer: Self-pay | Admitting: Internal Medicine

## 2023-03-22 ENCOUNTER — Encounter: Payer: Self-pay | Admitting: Pharmacist

## 2023-03-28 ENCOUNTER — Other Ambulatory Visit (INDEPENDENT_AMBULATORY_CARE_PROVIDER_SITE_OTHER): Payer: 59

## 2023-03-28 DIAGNOSIS — Z125 Encounter for screening for malignant neoplasm of prostate: Secondary | ICD-10-CM | POA: Diagnosis not present

## 2023-03-28 DIAGNOSIS — E782 Mixed hyperlipidemia: Secondary | ICD-10-CM | POA: Diagnosis not present

## 2023-03-28 DIAGNOSIS — E1165 Type 2 diabetes mellitus with hyperglycemia: Secondary | ICD-10-CM

## 2023-03-28 LAB — CBC WITH DIFFERENTIAL/PLATELET
Basophils Absolute: 0.1 10*3/uL (ref 0.0–0.1)
Basophils Relative: 0.7 % (ref 0.0–3.0)
Eosinophils Absolute: 0.3 10*3/uL (ref 0.0–0.7)
Eosinophils Relative: 4.4 % (ref 0.0–5.0)
HCT: 49.4 % (ref 39.0–52.0)
Hemoglobin: 16.8 g/dL (ref 13.0–17.0)
Lymphocytes Relative: 30.4 % (ref 12.0–46.0)
Lymphs Abs: 2.3 10*3/uL (ref 0.7–4.0)
MCHC: 34 g/dL (ref 30.0–36.0)
MCV: 89 fl (ref 78.0–100.0)
Monocytes Absolute: 0.6 10*3/uL (ref 0.1–1.0)
Monocytes Relative: 8.3 % (ref 3.0–12.0)
Neutro Abs: 4.3 10*3/uL (ref 1.4–7.7)
Neutrophils Relative %: 56.2 % (ref 43.0–77.0)
Platelets: 276 10*3/uL (ref 150.0–400.0)
RBC: 5.55 Mil/uL (ref 4.22–5.81)
RDW: 12.9 % (ref 11.5–15.5)
WBC: 7.7 10*3/uL (ref 4.0–10.5)

## 2023-03-28 LAB — HEPATIC FUNCTION PANEL
ALT: 31 U/L (ref 0–53)
AST: 18 U/L (ref 0–37)
Albumin: 4.5 g/dL (ref 3.5–5.2)
Alkaline Phosphatase: 62 U/L (ref 39–117)
Bilirubin, Direct: 0.2 mg/dL (ref 0.0–0.3)
Total Bilirubin: 0.7 mg/dL (ref 0.2–1.2)
Total Protein: 7.5 g/dL (ref 6.0–8.3)

## 2023-03-28 LAB — HEMOGLOBIN A1C: Hgb A1c MFr Bld: 7.3 % — ABNORMAL HIGH (ref 4.6–6.5)

## 2023-03-28 LAB — LIPID PANEL
Cholesterol: 126 mg/dL (ref 0–200)
HDL: 34.9 mg/dL — ABNORMAL LOW (ref >39.00)
LDL Cholesterol: 72 mg/dL (ref 0–99)
NonHDL: 91.22
Total CHOL/HDL Ratio: 4
Triglycerides: 94 mg/dL (ref 0.0–149.0)
VLDL: 18.8 mg/dL (ref 0.0–40.0)

## 2023-03-28 LAB — BASIC METABOLIC PANEL
BUN: 19 mg/dL (ref 6–23)
CO2: 26 mEq/L (ref 19–32)
Calcium: 9.4 mg/dL (ref 8.4–10.5)
Chloride: 103 mEq/L (ref 96–112)
Creatinine, Ser: 1.16 mg/dL (ref 0.40–1.50)
GFR: 74.35 mL/min (ref >60.00)
Glucose, Bld: 160 mg/dL — ABNORMAL HIGH (ref 70–99)
Potassium: 3.8 mEq/L (ref 3.5–5.1)
Sodium: 137 mEq/L (ref 135–145)

## 2023-03-28 LAB — PSA: PSA: 0.41 ng/mL (ref 0.10–4.00)

## 2023-03-29 ENCOUNTER — Encounter: Payer: Self-pay | Admitting: Pharmacist

## 2023-03-29 ENCOUNTER — Other Ambulatory Visit: Payer: 59 | Admitting: Pharmacist

## 2023-03-30 ENCOUNTER — Other Ambulatory Visit: Payer: 59 | Admitting: Pharmacist

## 2023-03-31 ENCOUNTER — Other Ambulatory Visit: Payer: Self-pay | Admitting: Pharmacist

## 2023-03-31 MED ORDER — SEMAGLUTIDE (1 MG/DOSE) 4 MG/3ML ~~LOC~~ SOPN: 1.0000 mg | PEN_INJECTOR | SUBCUTANEOUS | 2 refills | Status: DC

## 2023-03-31 NOTE — Patient Instructions (Signed)
Zamar,   Complete the supply you have of Ozempic 0.5 mg weekly, then increase to 1 mg weekly.   Increasing the dose can sometimes cause stomach upset, queasiness, or constipation. This generally improves over time. Call our office if these symptoms occur and worsen, or if you have severe symptoms such as vomiting, diarrhea, or stomach pain.   Take care!  Catie Eppie Gibson, PharmD, BCACP, CPP Clinical Pharmacist [facility] [phone]

## 2023-03-31 NOTE — Progress Notes (Signed)
Care Coordination Call  Patient communicated that he needed to reschedule our appointment. A1c has improved, but still elevated. Increase Ozempic to 1 mg weekly. Patient expressed understanding. Follow up rescheduled for 4 weeks.   Catie Eppie Gibson, PharmD, BCACP, CPP Clinical Pharmacist [facility] [phone]

## 2023-04-01 ENCOUNTER — Other Ambulatory Visit: Payer: Self-pay | Admitting: Internal Medicine

## 2023-04-05 ENCOUNTER — Other Ambulatory Visit: Payer: Self-pay | Admitting: Internal Medicine

## 2023-05-10 ENCOUNTER — Other Ambulatory Visit: Payer: Self-pay | Admitting: Internal Medicine

## 2023-05-10 NOTE — Telephone Encounter (Signed)
Rx ok'd for buspar #30 with one refill.

## 2023-05-11 ENCOUNTER — Other Ambulatory Visit: Payer: 59 | Admitting: Pharmacist

## 2023-05-11 NOTE — Progress Notes (Signed)
05/11/2023 Name: Francisco Jackson MRN: [id number] DOB: [birth year]  Chief Complaint  Patient presents with   Medication Management   Diabetes    Francisco Jackson is a 47 y.o. year old male who presented for a telephone visit.   They were referred to the pharmacist by their PCP for assistance in managing diabetes.    Subjective:  Care Team: Primary Care Provider: Dale [place], MD ; Next Scheduled Visit: 06/16/23  Medication Access/Adherence  Current Pharmacy:  [facility] - [place], [place] - [address] [place] [address] Phone: [phone] Fax: [phone]   Patient reports affordability concerns with their medications: No  Patient reports access/transportation concerns to their pharmacy: No  Patient reports adherence concerns with their medications:  No     Diabetes:  Current medications: Ozempic 1 mg weekly, Jardiance 25 mg daily  Wearing DexCom. Has not connected to Clarity yet. Will send instructions.   Fasting readings: 130-140s;   Patient reports feeling shaky if he doesn't eat breakfast and is at work, but overall doing well. Denies any increased stomach upset, nausea, constipation since increasing Ozempic.   Anxiety: Current medications: buspirone 5 mg twice daily PRN  Patient reports benefit with this medication. Appreciates refill being given.    Objective:  Lab Results  Component Value Date   HGBA1C 7.3 (H) 03/28/2023    Lab Results  Component Value Date   CREATININE 1.16 03/28/2023   BUN 19 03/28/2023   NA 137 03/28/2023   K 3.8 03/28/2023   CL 103 03/28/2023   CO2 26 03/28/2023    Lab Results  Component Value Date   CHOL 126 03/28/2023   HDL 34.90 (L) 03/28/2023   LDLCALC 72 03/28/2023   TRIG 94.0 03/28/2023   CHOLHDL 4 03/28/2023    Medications Reviewed Today     Reviewed by Francisco Jackson, RPH-CPP (Pharmacist) on 05/11/23 at 1140  Med List Status: <None>   Medication Order Taking? Sig  Documenting Provider Last Dose Status Informant  amLODipine (NORVASC) 10 MG tablet [id number]  TAKE 1 TABLET BY MOUTH ONCE DAILY Francisco [place], MD  Active   amphetamine-dextroamphetamine (ADDERALL) 10 MG tablet [id number]  Take 1 tablet (10 mg total) by mouth 2 (two) times daily. Francisco [place], MD  Active   busPIRone (BUSPAR) 5 MG tablet [id number] Yes TAKE ONE TABLET BY MOUTH TWICE DAILY AS NEEDED Francisco [place], MD Taking Active   Continuous Glucose Sensor (DEXCOM G7 SENSOR) MISC [id number] Yes USE TO CHECK BLOOD SUGAR AS DIRECTED Francisco [place], MD Taking Active   hydrochlorothiazide (MICROZIDE) 12.5 MG capsule [id number]  TAKE 1 CAPSULE BY MOUTH ONCE DAILY Francisco [place], MD  Active   JARDIANCE 25 MG TABS tablet [id number] Yes TAKE 1 TABLET BY MOUTH DAILY BEFORE Francisco Jackson, Francisco Hum[place], MD Taking Active   lisinopril (ZESTRIL) 5 MG tablet [id number]  TAKE 1 TABLET BY MOUTH DAILY Francisco [place], MD  Active   mometasone (ELOCON) 0.1 % cream [id number]  Apply to aa's ears BID up to 5d/wk PRN. Francisco [place], MD  Active   montelukast (SINGULAIR) 10 MG tablet [id number]  TAKE ONE TABLET EVERY DAY Francisco [place], MD  Active   Multiple Vitamin (MULTIVITAMIN) tablet [id number]  Take 1 tablet by mouth daily. [provider]  Active   rosuvastatin (CRESTOR) 5 MG tablet [id number] Yes TAKE 1 TABLET BY MOUTH ONCE DAILY Francisco [place], MD Taking Active   Semaglutide, 1 MG/DOSE, 4 MG/3ML SOPN [id number] Yes Inject 1 mg  as directed once a week. Francisco [place], MD Taking Active   sildenafil (REVATIO) 20 MG tablet [id number]  Take 2-4 tablets two hours before intercouse on an empty stomach.  Do not take with nitrates. Francisco [place], MD  Active               Assessment/Plan:   Diabetes: - Currently improved. Will connect to Clarity portal so I can review sugars.  - Reviewed goal A1c, goal fasting, and goal 2 hour post prandial glucose, as well as Time in Range goal of >70% - Recommend to  continue current regimen at this time.    Follow Up Plan: Follow up with PCP in ~ 6 week   Francisco Jackson, PharmD, BCACP, CPP Clinical Pharmacist [facility] [phone]

## 2023-05-11 NOTE — Patient Instructions (Signed)
Amos,   It was great talking to you today! Continue Ozempic 1 mg weekly and Jardiance 25 mg daily    [facility] invites you to share your continuous glucose monitoring (CGM) data using Dexcom Clarity.  Dexcom Clarity displays your CGM data in graphs so you and your clinic can view patterns, trends, and statistics anytime.  To start sharing data with this clinic, enter the clinic code in your Clarity app, Clarity web, or at [email or web address]  Clinic code: [id number]   Please reach out with any questions or concerns!  Catie Eppie Gibson, PharmD, BCACP, CPP Clinical Pharmacist [facility] [phone]

## 2023-05-26 ENCOUNTER — Encounter (INDEPENDENT_AMBULATORY_CARE_PROVIDER_SITE_OTHER): Payer: Self-pay

## 2023-06-02 ENCOUNTER — Other Ambulatory Visit: Payer: Self-pay | Admitting: Internal Medicine

## 2023-06-15 ENCOUNTER — Ambulatory Visit (INDEPENDENT_AMBULATORY_CARE_PROVIDER_SITE_OTHER): Payer: 59 | Admitting: Internal Medicine

## 2023-06-15 ENCOUNTER — Encounter: Payer: Self-pay | Admitting: Internal Medicine

## 2023-06-15 ENCOUNTER — Ambulatory Visit: Payer: 59 | Admitting: Dermatology

## 2023-06-15 VITALS — BP 124/70 | HR 93 | Temp 97.9°F | Ht 71.0 in | Wt 221.4 lb

## 2023-06-15 DIAGNOSIS — E782 Mixed hyperlipidemia: Secondary | ICD-10-CM | POA: Diagnosis not present

## 2023-06-15 DIAGNOSIS — I10 Essential (primary) hypertension: Secondary | ICD-10-CM | POA: Diagnosis not present

## 2023-06-15 DIAGNOSIS — G4730 Sleep apnea, unspecified: Secondary | ICD-10-CM

## 2023-06-15 DIAGNOSIS — F1029 Alcohol dependence with unspecified alcohol-induced disorder: Secondary | ICD-10-CM

## 2023-06-15 DIAGNOSIS — Z0000 Encounter for general adult medical examination without abnormal findings: Secondary | ICD-10-CM | POA: Diagnosis not present

## 2023-06-15 DIAGNOSIS — J4530 Mild persistent asthma, uncomplicated: Secondary | ICD-10-CM

## 2023-06-15 DIAGNOSIS — F909 Attention-deficit hyperactivity disorder, unspecified type: Secondary | ICD-10-CM | POA: Diagnosis not present

## 2023-06-15 DIAGNOSIS — D582 Other hemoglobinopathies: Secondary | ICD-10-CM

## 2023-06-15 DIAGNOSIS — N529 Male erectile dysfunction, unspecified: Secondary | ICD-10-CM

## 2023-06-15 DIAGNOSIS — E1165 Type 2 diabetes mellitus with hyperglycemia: Secondary | ICD-10-CM | POA: Diagnosis not present

## 2023-06-15 DIAGNOSIS — Z7985 Long-term (current) use of injectable non-insulin antidiabetic drugs: Secondary | ICD-10-CM | POA: Diagnosis not present

## 2023-06-15 DIAGNOSIS — F439 Reaction to severe stress, unspecified: Secondary | ICD-10-CM

## 2023-06-15 DIAGNOSIS — R7989 Other specified abnormal findings of blood chemistry: Secondary | ICD-10-CM | POA: Diagnosis not present

## 2023-06-15 MED ORDER — AMPHETAMINE-DEXTROAMPHETAMINE 10 MG PO TABS: 10.0000 mg | ORAL_TABLET | Freq: Two times a day (BID) | ORAL | 0 refills | Status: DC

## 2023-06-15 MED ORDER — ONDANSETRON HCL 4 MG PO TABS: 4.0000 mg | ORAL_TABLET | Freq: Two times a day (BID) | ORAL | 0 refills | Status: DC | PRN

## 2023-06-15 MED ORDER — PREDNISONE 10 MG PO TABS: ORAL_TABLET | ORAL | 0 refills | Status: DC

## 2023-06-15 MED ORDER — AZITHROMYCIN 250 MG PO TABS: ORAL_TABLET | ORAL | 0 refills | Status: AC

## 2023-06-15 MED ORDER — SILDENAFIL CITRATE 20 MG PO TABS: ORAL_TABLET | ORAL | 1 refills | Status: AC

## 2023-06-15 MED ORDER — BUSPIRONE HCL 5 MG PO TABS: 5.0000 mg | ORAL_TABLET | Freq: Two times a day (BID) | ORAL | 1 refills | Status: DC | PRN

## 2023-06-15 NOTE — Assessment & Plan Note (Signed)
On crestor.  Low cholesterol diet and exercise.  Follow lipid panel and liver function tests.

## 2023-06-15 NOTE — Assessment & Plan Note (Signed)
Continue hctz and amlodipine.  Follow pressures.  Follow metabolic panel.

## 2023-06-15 NOTE — Assessment & Plan Note (Signed)
Has seen urology.  Request refill sildenafil.

## 2023-06-15 NOTE — Assessment & Plan Note (Signed)
On ozempic. Increased to 1mg  dose. Off metformin due to GI side effects.  GI issues much better off metformin. Continues on jardiance. Last A1c improved 7.3. Discussed continued diet and exercise.  Has lost weight. Discussed importance of checking sugars.

## 2023-06-15 NOTE — Assessment & Plan Note (Signed)
Has done well on adderall.  Feels doing well.  Follow. Continue current dose.

## 2023-06-15 NOTE — Progress Notes (Signed)
Subjective:    Patient ID: Francisco Jackson, male    DOB: 02-May-1976, 47 y.o.   MRN: [id number]  Patient here for  Chief Complaint  Patient presents with   Annual Exam    HPI Here for a physical exam.  Also f/u ADHD.  On adderall. Feels he is doing well on this medication. Previous - elevated A1c - 8.1. On ozempic. Increased to 1mg  dose. Off metformin due to GI side effects.  GI issues much better off metformin. Continues on jardiance. Last A1c improved 7.3. Discussed continued diet and exercise.  Has lost weight. Continues on singulair, symbicort and astelin.  Seems to control his allergy symptoms and breathing.  Last visit, was started on buspar. Helping. No chest pain or sob reported. Request refill sildenafil. Discussed importance of checking sugars. Planning to travel.  Request abx, prednisone to take - in case of flare or infection.     Past Medical History:  Diagnosis Date   Asthma    Atypical mole 04/05/2013   Left forearm. Atypical combined nevus, moderate atypia.   Diabetes mellitus without complication (HCC)    Dysplastic nevus 04/23/2014   Left prox. mid. tricep axilla. Mild atypia, margins free.   Hypertension    Personality disorder (HCC)    borderline   Past Surgical History:  Procedure Laterality Date   ANTERIOR CRUCIATE LIGAMENT REPAIR Right 2006   CERVICAL DISC SURGERY  2010   COLONOSCOPY WITH PROPOFOL N/A 11/01/2019   Procedure: COLONOSCOPY WITH PROPOFOL;  Surgeon: Wyline Mood, MD;  Location: [facility] END[facility];  Service: Gastroenterology;  Laterality: N/A;   MENISCUS REPAIR Left 1996   Family History  Problem Relation Age of Onset   Diabetes Mother    Melanoma Mother    Diabetes Paternal Aunt    Heart attack Paternal Aunt    Diabetes Paternal Uncle    Diabetes Maternal Grandmother    Diabetes Maternal Grandfather    Heart disease Maternal Grandfather    Heart attack Maternal Grandfather    Heart attack Father    Hypertension Father    Diabetes Sister     Hypertension Sister    Kidney cancer Neg Hx    Kidney disease Neg Hx    Prostate cancer Neg Hx    Social History   Socioeconomic History   Marital status: Married    Spouse name: Gunnar Fusi   Number of children: Not on file   Years of education: Not on file   Highest education level: Associate degree: occupational, Scientist, product/process development, or vocational program  Occupational History   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   Tobacco Use   Smoking status: Former    Current packs/day: 0.00    Average packs/day: 0.5 packs/day for 10.0 years (5.0 ttl pk-yrs)    Types: Cigarettes    Start date: 10/11/1992    Quit date: 10/11/2002    Years since quitting: 20.6   Smokeless tobacco: Former    Types: Snuff    Quit date: 10/11/2009  Vaping Use   Vaping status: Never Used  Substance and Sexual Activity   Alcohol use: Yes    Alcohol/week: 4.0 standard drinks of alcohol    Types: 4 Glasses of wine per week    Comment: ocassional   Drug use: No   Sexual activity: Not on file  Other Topics Concern   Not on file  Social History Narrative   Not on file   Social Determinants of Health   Financial Resource  Strain: Low Risk  (03/10/2023)   Overall Financial Resource Strain (CARDIA)    Difficulty of Paying Living Expenses: Not hard at all  Food Insecurity: No Food Insecurity (03/10/2023)   Hunger Vital Sign    Worried About Running Out of Food in the Last Year: Never true    Ran Out of Food in the Last Year: Never true  Transportation Needs: No Transportation Needs (03/10/2023)   PRAPARE - Administrator, Civil Service (Medical): No    Lack of Transportation (Non-Medical): No  Physical Activity: Insufficiently Active (03/10/2023)   Exercise Vital Sign    Days of Exercise per Week: 3 days    Minutes of Exercise per Session: 30 min  Stress: No Stress Concern Present (03/10/2023)   [employer] of Occupational Health - Occupational Stress Questionnaire    Feeling of Stress  : Only a little  Social Connections: Socially Integrated (03/10/2023)   Social Connection and Isolation Panel [NHANES]    Frequency of Communication with Friends and Family: More than three times a week    Frequency of Social Gatherings with Friends and Family: More than three times a week    Attends Religious Services: More than 4 times per year    Active Member of [employer] or Organizations: Yes    Attends Engineer, structural: More than 4 times per year    Marital Status: Married     Review of Systems  Constitutional:  Negative for appetite change and unexpected weight change.  HENT:  Negative for congestion, sinus pressure and sore throat.   Eyes:  Negative for pain and visual disturbance.  Respiratory:  Negative for cough, chest tightness and shortness of breath.   Cardiovascular:  Negative for chest pain, palpitations and leg swelling.  Gastrointestinal:  Negative for abdominal pain, diarrhea, nausea and vomiting.  Genitourinary:  Negative for difficulty urinating and dysuria.  Musculoskeletal:  Negative for joint swelling and myalgias.  Skin:  Negative for color change and rash.  Neurological:  Negative for dizziness and headaches.  Hematological:  Negative for adenopathy. Does not bruise/bleed easily.  Psychiatric/Behavioral:  Negative for agitation and dysphoric mood.        Objective:     BP 124/70   Pulse 93   Temp 97.9 F (36.6 C) (Oral)   Ht 5\' 11"  (1.803 m)   Wt 221 lb 6.4 oz (100.4 kg)   SpO2 98%   BMI 30.88 kg/m  Wt Readings from Last 3 Encounters:  06/15/23 221 lb 6.4 oz (100.4 kg)  11/29/22 236 lb (107 kg)  07/05/22 241 lb (109.3 kg)    Physical Exam Constitutional:      General: He is not in acute distress.    Appearance: Normal appearance. He is well-developed.  HENT:     Head: Normocephalic and atraumatic.     Right Ear: External ear normal.     Left Ear: External ear normal.  Eyes:     General: No scleral icterus.       Right eye: No  discharge.        Left eye: No discharge.     Conjunctiva/sclera: Conjunctivae normal.  Neck:     Thyroid: No thyromegaly.  Cardiovascular:     Rate and Rhythm: Normal rate and regular rhythm.  Pulmonary:     Effort: No respiratory distress.     Breath sounds: Normal breath sounds. No wheezing.  Abdominal:     General: Bowel sounds are normal.  Palpations: Abdomen is soft.     Tenderness: There is no abdominal tenderness.  Musculoskeletal:        General: No swelling or tenderness.     Cervical back: Neck supple. No tenderness.  Lymphadenopathy:     Cervical: No cervical adenopathy.  Skin:    Findings: No erythema or rash.  Neurological:     Mental Status: He is alert and oriented to person, place, and time.  Psychiatric:        Mood and Affect: Mood normal.        Behavior: Behavior normal.      Outpatient Encounter Medications as of 06/15/2023  Medication Sig   amphetamine-dextroamphetamine (ADDERALL) 10 MG tablet Take 1 tablet (10 mg total) by mouth 2 (two) times daily.   [START ON 07/15/2023] amphetamine-dextroamphetamine (ADDERALL) 10 MG tablet Take 1 tablet (10 mg total) by mouth 2 (two) times daily.   [START ON 08/14/2023] amphetamine-dextroamphetamine (ADDERALL) 10 MG tablet Take 1 tablet (10 mg total) by mouth 2 (two) times daily.   azithromycin (ZITHROMAX) 250 MG tablet Take 2 tablets on day 1, then 1 tablet daily on days 2 through 5   ondansetron (ZOFRAN) 4 MG tablet Take 1 tablet (4 mg total) by mouth 2 (two) times daily as needed for nausea or vomiting.   predniSONE (DELTASONE) 10 MG tablet Take 4 tablets x 1 day and then decrease by 1/2 tablet per day until down to zero mg.   amLODipine (NORVASC) 10 MG tablet TAKE 1 TABLET BY MOUTH ONCE DAILY   busPIRone (BUSPAR) 5 MG tablet Take 1 tablet (5 mg total) by mouth 2 (two) times daily as needed.   Continuous Glucose Sensor (DEXCOM G7 SENSOR) MISC USE TO CHECK BLOOD SUGAR AS DIRECTED   hydrochlorothiazide (MICROZIDE)  12.5 MG capsule TAKE 1 CAPSULE BY MOUTH ONCE DAILY   JARDIANCE 25 MG TABS tablet TAKE 1 TABLET BY MOUTH DAILY BEFORE BREAKFAST   lisinopril (ZESTRIL) 5 MG tablet TAKE 1 TABLET BY MOUTH DAILY   mometasone (ELOCON) 0.1 % cream Apply to aa's ears BID up to 5d/wk PRN.   montelukast (SINGULAIR) 10 MG tablet TAKE ONE TABLET EVERY DAY   Multiple Vitamin (MULTIVITAMIN) tablet Take 1 tablet by mouth daily.   rosuvastatin (CRESTOR) 5 MG tablet TAKE 1 TABLET BY MOUTH ONCE DAILY   Semaglutide, 1 MG/DOSE, 4 MG/3ML SOPN Inject 1 mg as directed once a week.   sildenafil (REVATIO) 20 MG tablet Take 2 tablets two hours before intercouse on an empty stomach.  Do not take with nitrates.   [DISCONTINUED] amphetamine-dextroamphetamine (ADDERALL) 10 MG tablet Take 1 tablet (10 mg total) by mouth 2 (two) times daily.   [DISCONTINUED] busPIRone (BUSPAR) 5 MG tablet TAKE ONE TABLET BY MOUTH TWICE DAILY AS NEEDED   [DISCONTINUED] sildenafil (REVATIO) 20 MG tablet Take 2-4 tablets two hours before intercouse on an empty stomach.  Do not take with nitrates.   No facility-administered encounter medications on file as of 06/15/2023.     Lab Results  Component Value Date   WBC 7.7 03/28/2023   HGB 16.8 03/28/2023   HCT 49.4 03/28/2023   PLT 276.0 03/28/2023   GLUCOSE 160 (H) 03/28/2023   CHOL 126 03/28/2023   TRIG 94.0 03/28/2023   HDL 34.90 (L) 03/28/2023   LDLCALC 72 03/28/2023   ALT 31 03/28/2023   AST 18 03/28/2023   NA 137 03/28/2023   K 3.8 03/28/2023   CL 103 03/28/2023   CREATININE 1.16 03/28/2023  BUN 19 03/28/2023   CO2 26 03/28/2023   TSH 1.53 05/06/2022   PSA 0.41 03/28/2023   HGBA1C 7.3 (H) 03/28/2023   MICROALBUR <0.7 11/29/2022    MR ABDOMEN MRCP W WO CONTAST  Result Date: 07/20/2020 CLINICAL DATA:  Epigastric pain for 1 month. History of pancreatitis. History of ethanol abuse. EXAM: MRI ABDOMEN WITHOUT AND WITH CONTRAST (INCLUDING MRCP) TECHNIQUE: Multiplanar multisequence MR imaging of  the abdomen was performed both before and after the administration of intravenous contrast. Heavily T2-weighted images of the biliary and pancreatic ducts were obtained, and three-dimensional MRCP images were rendered by post processing. CONTRAST:  9mL GADAVIST GADOBUTROL 1 MMOL/ML IV SOLN field COMPARISON:  Abdominal ultrasound 04/30/2020.  CT 05/02/2020. FINDINGS: Lower chest: Normal heart size without pericardial or pleural effusion. Hepatobiliary: Mild hepatomegaly and marked hepatic steatosis. 18.9 cm craniocaudal. No focal liver lesion. Normal gallbladder. No intra or extrahepatic biliary duct dilatation. No choledocholithiasis. Pancreas: No evidence of acute pancreatitis. No pancreatic duct dilatation. No pancreas divisum. Pancreas enhances normally. Spleen:  Normal in size, without focal abnormality. Adrenals/Urinary Tract: Normal adrenal glands. Normal kidneys, without hydronephrosis. Stomach/Bowel: Tiny hiatal hernia.  Normal abdominal bowel loops. Vascular/Lymphatic: Aortic atherosclerosis. No abdominal adenopathy. Other:  No ascites. Musculoskeletal: No acute osseous abnormality. IMPRESSION: 1. No evidence of acute pancreatitis or other acute abdominal process. No biliary duct dilatation or choledocholithiasis. 2. Hepatic steatosis and mild hepatomegaly. 3. Tiny hiatal hernia. Electronically Signed   By: Jeronimo Greaves M.D.   On: 07/20/2020 11:27   MR 3D Recon At Scanner  Result Date: 07/20/2020 CLINICAL DATA:  Epigastric pain for 1 month. History of pancreatitis. History of ethanol abuse. EXAM: MRI ABDOMEN WITHOUT AND WITH CONTRAST (INCLUDING MRCP) TECHNIQUE: Multiplanar multisequence MR imaging of the abdomen was performed both before and after the administration of intravenous contrast. Heavily T2-weighted images of the biliary and pancreatic ducts were obtained, and three-dimensional MRCP images were rendered by post processing. CONTRAST:  9mL GADAVIST GADOBUTROL 1 MMOL/ML IV SOLN field  COMPARISON:  Abdominal ultrasound 04/30/2020.  CT 05/02/2020. FINDINGS: Lower chest: Normal heart size without pericardial or pleural effusion. Hepatobiliary: Mild hepatomegaly and marked hepatic steatosis. 18.9 cm craniocaudal. No focal liver lesion. Normal gallbladder. No intra or extrahepatic biliary duct dilatation. No choledocholithiasis. Pancreas: No evidence of acute pancreatitis. No pancreatic duct dilatation. No pancreas divisum. Pancreas enhances normally. Spleen:  Normal in size, without focal abnormality. Adrenals/Urinary Tract: Normal adrenal glands. Normal kidneys, without hydronephrosis. Stomach/Bowel: Tiny hiatal hernia.  Normal abdominal bowel loops. Vascular/Lymphatic: Aortic atherosclerosis. No abdominal adenopathy. Other:  No ascites. Musculoskeletal: No acute osseous abnormality. IMPRESSION: 1. No evidence of acute pancreatitis or other acute abdominal process. No biliary duct dilatation or choledocholithiasis. 2. Hepatic steatosis and mild hepatomegaly. 3. Tiny hiatal hernia. Electronically Signed   By: Jeronimo Greaves M.D.   On: 07/20/2020 11:27       Assessment & Plan:  Healthcare maintenance Assessment & Plan: Physical today 06/15/23.  Colonoscopy 10/2019 - descending colon polyp (tubular adenoma) - Dr Tobi Bastos - recommended f/u in 7 years.  PSA 03/28/23 - .41   Abnormal liver function tests Assessment & Plan: Keep sugars under control.  Diet, exercise and weight loss. Follow liver function tests.     Attention deficit hyperactivity disorder (ADHD), unspecified ADHD type Assessment & Plan: Has done well on adderall.  Feels doing well.  Follow. Continue current dose.    Mild persistent asthma without complication Assessment & Plan: Has seen Dr Sherburn Callas.  Continue singulair, symbicort and  astelin.  Breathing stable.    Type 2 diabetes mellitus with hyperglycemia, without long-term current use of insulin (HCC) Assessment & Plan: On ozempic. Increased to 1mg  dose. Off metformin due  to GI side effects.  GI issues much better off metformin. Continues on jardiance. Last A1c improved 7.3. Discussed continued diet and exercise.  Has lost weight. Discussed importance of checking sugars.     Elevated hemoglobin (HCC) Assessment & Plan: Last hgb 16.9/HCT 49.  Follow.     Erectile dysfunction, unspecified erectile dysfunction type Assessment & Plan: Has seen urology.  Request refill sildenafil.     Alcohol dependence with unspecified alcohol-induced disorder [facility]) Assessment & Plan: Has cut down alcohol intake.  Follow.     Hypertension, essential Assessment & Plan: Continue hctz and amlodipine.  Follow pressures.  Follow metabolic panel.    Mixed hyperlipidemia Assessment & Plan: On crestor.  Low cholesterol diet and exercise.  Follow lipid panel and liver function tests.     Stress Assessment & Plan: Overall appears to be handling things relatively well.  Feels Buspar is helping.  Follow.    Sleep apnea, unspecified type Assessment & Plan: Previously tested.  Saw pulmonary.  CPAP.  Need to confirm using.    Other orders -     Amphetamine-Dextroamphetamine; Take 1 tablet (10 mg total) by mouth 2 (two) times daily.  Dispense: 60 tablet; Refill: 0 -     Amphetamine-Dextroamphetamine; Take 1 tablet (10 mg total) by mouth 2 (two) times daily.  Dispense: 60 tablet; Refill: 0 -     Amphetamine-Dextroamphetamine; Take 1 tablet (10 mg total) by mouth 2 (two) times daily.  Dispense: 60 tablet; Refill: 0 -     busPIRone HCl; Take 1 tablet (5 mg total) by mouth 2 (two) times daily as needed.  Dispense: 60 tablet; Refill: 1 -     Sildenafil Citrate; Take 2 tablets two hours before intercouse on an empty stomach.  Do not take with nitrates.  Dispense: 30 tablet; Refill: 1 -     predniSONE; Take 4 tablets x 1 day and then decrease by 1/2 tablet per day until down to zero mg.  Dispense: 18 tablet; Refill: 0 -     Azithromycin; Take 2 tablets on day 1, then 1 tablet daily  on days 2 through 5  Dispense: 6 tablet; Refill: 0 -     Ondansetron HCl; Take 1 tablet (4 mg total) by mouth 2 (two) times daily as needed for nausea or vomiting.  Dispense: 14 tablet; Refill: 0     Dale [place], MD

## 2023-06-15 NOTE — Assessment & Plan Note (Signed)
Last hgb 16.9/HCT 49.  Follow.

## 2023-06-15 NOTE — Assessment & Plan Note (Signed)
Has seen Dr [place] Callas.  Continue singulair, symbicort and astelin.  Breathing stable.

## 2023-06-15 NOTE — Assessment & Plan Note (Signed)
Previously tested.  Saw pulmonary.  CPAP.  Need to confirm using.

## 2023-06-15 NOTE — Assessment & Plan Note (Signed)
Keep sugars under control.  Diet, exercise and weight loss. Follow liver function tests.   

## 2023-06-15 NOTE — Assessment & Plan Note (Signed)
Has cut down alcohol intake.  Follow.

## 2023-06-15 NOTE — Assessment & Plan Note (Signed)
Physical today 06/15/23.  Colonoscopy 10/2019 - descending colon polyp (tubular adenoma) - Dr Tobi Bastos - recommended f/u in 7 years.  PSA 03/28/23 - .41

## 2023-06-15 NOTE — Assessment & Plan Note (Signed)
Overall appears to be handling things relatively well.  Feels Buspar is helping.  Follow.

## 2023-07-06 ENCOUNTER — Other Ambulatory Visit: Payer: Self-pay | Admitting: Internal Medicine

## 2023-07-14 ENCOUNTER — Telehealth: Payer: Self-pay | Admitting: Internal Medicine

## 2023-07-14 DIAGNOSIS — I10 Essential (primary) hypertension: Secondary | ICD-10-CM

## 2023-07-14 DIAGNOSIS — E782 Mixed hyperlipidemia: Secondary | ICD-10-CM

## 2023-07-14 DIAGNOSIS — E1165 Type 2 diabetes mellitus with hyperglycemia: Secondary | ICD-10-CM

## 2023-07-14 NOTE — Telephone Encounter (Signed)
Patient need lab orders.

## 2023-07-14 NOTE — Telephone Encounter (Signed)
Order placed for labs.

## 2023-07-20 ENCOUNTER — Other Ambulatory Visit: Payer: 59

## 2023-07-20 DIAGNOSIS — E1165 Type 2 diabetes mellitus with hyperglycemia: Secondary | ICD-10-CM | POA: Diagnosis not present

## 2023-07-20 DIAGNOSIS — E782 Mixed hyperlipidemia: Secondary | ICD-10-CM | POA: Diagnosis not present

## 2023-07-20 DIAGNOSIS — I10 Essential (primary) hypertension: Secondary | ICD-10-CM | POA: Diagnosis not present

## 2023-07-20 LAB — LIPID PANEL
Cholesterol: 106 mg/dL (ref 0–200)
HDL: 38.8 mg/dL — ABNORMAL LOW (ref >39.00)
LDL Cholesterol: 48 mg/dL (ref 0–99)
NonHDL: 67.69
Total CHOL/HDL Ratio: 3
Triglycerides: 99 mg/dL (ref 0.0–149.0)
VLDL: 19.8 mg/dL (ref 0.0–40.0)

## 2023-07-20 LAB — HEPATIC FUNCTION PANEL
ALT: 25 U/L (ref 0–53)
AST: 15 U/L (ref 0–37)
Albumin: 4.2 g/dL (ref 3.5–5.2)
Alkaline Phosphatase: 67 U/L (ref 39–117)
Bilirubin, Direct: 0.1 mg/dL (ref 0.0–0.3)
Total Bilirubin: 0.4 mg/dL (ref 0.2–1.2)
Total Protein: 6.5 g/dL (ref 6.0–8.3)

## 2023-07-20 LAB — BASIC METABOLIC PANEL
BUN: 15 mg/dL (ref 6–23)
CO2: 28 meq/L (ref 19–32)
Calcium: 9.5 mg/dL (ref 8.4–10.5)
Chloride: 102 meq/L (ref 96–112)
Creatinine, Ser: 1.05 mg/dL (ref 0.40–1.50)
GFR: 83.61 mL/min (ref >60.00)
Glucose, Bld: 136 mg/dL — ABNORMAL HIGH (ref 70–99)
Potassium: 3.9 meq/L (ref 3.5–5.1)
Sodium: 138 meq/L (ref 135–145)

## 2023-07-20 LAB — TSH: TSH: 0.85 u[IU]/mL (ref 0.35–5.50)

## 2023-07-20 LAB — HEMOGLOBIN A1C: Hgb A1c MFr Bld: 6.9 % — ABNORMAL HIGH (ref 4.6–6.5)

## 2023-07-23 ENCOUNTER — Other Ambulatory Visit: Payer: Self-pay | Admitting: Internal Medicine

## 2023-09-06 ENCOUNTER — Other Ambulatory Visit: Payer: Self-pay | Admitting: Internal Medicine

## 2023-09-14 ENCOUNTER — Ambulatory Visit: Payer: 59 | Admitting: Internal Medicine

## 2023-09-14 ENCOUNTER — Encounter: Payer: Self-pay | Admitting: Internal Medicine

## 2023-09-14 VITALS — BP 124/70 | HR 89 | Temp 98.0°F | Resp 16 | Ht 71.0 in | Wt 221.0 lb

## 2023-09-14 DIAGNOSIS — E1165 Type 2 diabetes mellitus with hyperglycemia: Secondary | ICD-10-CM

## 2023-09-14 DIAGNOSIS — I10 Essential (primary) hypertension: Secondary | ICD-10-CM

## 2023-09-14 DIAGNOSIS — D582 Other hemoglobinopathies: Secondary | ICD-10-CM | POA: Diagnosis not present

## 2023-09-14 DIAGNOSIS — K760 Fatty (change of) liver, not elsewhere classified: Secondary | ICD-10-CM

## 2023-09-14 DIAGNOSIS — E782 Mixed hyperlipidemia: Secondary | ICD-10-CM | POA: Diagnosis not present

## 2023-09-14 DIAGNOSIS — F909 Attention-deficit hyperactivity disorder, unspecified type: Secondary | ICD-10-CM

## 2023-09-14 DIAGNOSIS — J4530 Mild persistent asthma, uncomplicated: Secondary | ICD-10-CM

## 2023-09-14 LAB — HM DIABETES FOOT EXAM

## 2023-09-14 MED ORDER — LISINOPRIL 5 MG PO TABS: 5.0000 mg | ORAL_TABLET | Freq: Every day | ORAL | 1 refills | Status: DC

## 2023-09-14 MED ORDER — ROSUVASTATIN CALCIUM 5 MG PO TABS: 5.0000 mg | ORAL_TABLET | Freq: Every day | ORAL | 3 refills | Status: DC

## 2023-09-14 MED ORDER — AMPHETAMINE-DEXTROAMPHETAMINE 10 MG PO TABS: 10.0000 mg | ORAL_TABLET | Freq: Every day | ORAL | 0 refills | Status: DC

## 2023-09-14 MED ORDER — BUSPIRONE HCL 5 MG PO TABS: 5.0000 mg | ORAL_TABLET | Freq: Two times a day (BID) | ORAL | 1 refills | Status: DC | PRN

## 2023-09-14 MED ORDER — HYDROCHLOROTHIAZIDE 12.5 MG PO CAPS: 12.5000 mg | ORAL_CAPSULE | Freq: Every day | ORAL | 1 refills | Status: DC

## 2023-09-14 MED ORDER — AMLODIPINE BESYLATE 10 MG PO TABS: 10.0000 mg | ORAL_TABLET | Freq: Every day | ORAL | 1 refills | Status: DC

## 2023-09-14 NOTE — Assessment & Plan Note (Signed)
Last hgb 16.8.  recheck cbc with next labs.

## 2023-09-14 NOTE — Assessment & Plan Note (Signed)
Found on abdominal ultrasound.  Diet, exercise. and weight loss.  Follow liver function tests.     

## 2023-09-14 NOTE — Assessment & Plan Note (Signed)
On ozempic. Tolerating 1mg  dose. Off metformin due to GI side effects.  GI issues much better off metformin. Continues on jardiance. Last A1c improved 6.9. Discussed continued diet and exercise.  Has lost weight. Trial of reader.

## 2023-09-14 NOTE — Assessment & Plan Note (Signed)
Has seen Dr [place] Callas.  Continue singulair, symbicort and astelin.  Breathing stable.

## 2023-09-14 NOTE — Progress Notes (Signed)
Subjective:    Patient ID: Francisco Jackson, male    DOB: [birth year], 47 y.o.   MRN: [id number]  Patient here for  Chief Complaint  Patient presents with   Medical Management of Chronic Issues    HPI Here to follow up regarding diabetes, hypertension and ADHD. Continues on jardiance and ozempic. With known sleep apnea. Reports he is doing well.  Handling stress.  No chest pain or sob reported.  No abdominal pain or bowel change reported. Discussed recent labs.  Tolerating ozempic.  A1c improved 6.9.     Past Medical History:  Diagnosis Date   Asthma    Atypical mole 04/05/2013   Left forearm. Atypical combined nevus, moderate atypia.   Diabetes mellitus without complication (HCC)    Dysplastic nevus 04/23/2014   Left prox. mid. tricep axilla. Mild atypia, margins free.   Hypertension    Personality disorder (HCC)    borderline   Past Surgical History:  Procedure Laterality Date   ANTERIOR CRUCIATE LIGAMENT REPAIR Right 2006   CERVICAL DISC SURGERY  2010   COLONOSCOPY WITH PROPOFOL N/A 11/01/2019   Procedure: COLONOSCOPY WITH PROPOFOL;  Surgeon: Wyline Mood, MD;  Location: [facility] ENDOSCOPY;  Service: Gastroenterology;  Laterality: N/A;   MENISCUS REPAIR Left 1996   Family History  Problem Relation Age of Onset   Diabetes Mother    Melanoma Mother    Diabetes Paternal Aunt    Heart attack Paternal Aunt    Diabetes Paternal Uncle    Diabetes Maternal Grandmother    Diabetes Maternal Grandfather    Heart disease Maternal Grandfather    Heart attack Maternal Grandfather    Heart attack Father    Hypertension Father    Diabetes Sister    Hypertension Sister    Kidney cancer Neg Hx    Kidney disease Neg Hx    Prostate cancer Neg Hx    Social History   Socioeconomic History   Marital status: Married    Spouse name: Gunnar Fusi   Number of children: Not on file   Years of education: Not on file   Highest education level: Associate degree: occupational, Scientist, product/process development, or  vocational program  Occupational History   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   Tobacco Use   Smoking status: Former    Current packs/day: 0.00    Average packs/day: 0.5 packs/day for 10.0 years (5.0 ttl pk-yrs)    Types: Cigarettes    Start date: 10/11/1992    Quit date: 10/11/2002    Years since quitting: 20.9   Smokeless tobacco: Former    Types: Snuff    Quit date: 10/11/2009  Vaping Use   Vaping status: Never Used  Substance and Sexual Activity   Alcohol use: Yes    Alcohol/week: 4.0 standard drinks of alcohol    Types: 4 Glasses of wine per week    Comment: ocassional   Drug use: No   Sexual activity: Not on file  Other Topics Concern   Not on file  Social History Narrative   Not on file   Social Determinants of Health   Financial Resource Strain: Low Risk  (03/10/2023)   Overall Financial Resource Strain (CARDIA)    Difficulty of Paying Living Expenses: Not hard at all  Food Insecurity: No Food Insecurity (03/10/2023)   Hunger Vital Sign    Worried About Running Out of Food in the Last Year: Never true    Ran Out of Food in the Last  Year: Never true  Transportation Needs: No Transportation Needs (03/10/2023)   PRAPARE - Administrator, Civil Service (Medical): No    Lack of Transportation (Non-Medical): No  Physical Activity: Insufficiently Active (03/10/2023)   Exercise Vital Sign    Days of Exercise per Week: 3 days    Minutes of Exercise per Session: 30 min  Stress: No Stress Concern Present (03/10/2023)   [employer] of Occupational Health - Occupational Stress Questionnaire    Feeling of Stress : Only a little  Social Connections: Socially Integrated (03/10/2023)   Social Connection and Isolation Panel [NHANES]    Frequency of Communication with Friends and Family: More than three times a week    Frequency of Social Gatherings with Friends and Family: More than three times a week    Attends Religious Services: More than 4  times per year    Active Member of [employer] or Organizations: Yes    Attends Engineer, structural: More than 4 times per year    Marital Status: Married     Review of Systems  Constitutional:  Negative for appetite change and unexpected weight change.  HENT:  Negative for congestion and sinus pressure.   Respiratory:  Negative for cough, chest tightness and shortness of breath.   Cardiovascular:  Negative for chest pain, palpitations and leg swelling.  Gastrointestinal:  Negative for abdominal pain, diarrhea, nausea and vomiting.  Genitourinary:  Negative for difficulty urinating and dysuria.  Musculoskeletal:  Negative for joint swelling and myalgias.  Skin:  Negative for color change and rash.  Neurological:  Negative for dizziness and headaches.  Psychiatric/Behavioral:  Negative for agitation and dysphoric mood.        Objective:     BP 124/70   Pulse 89   Temp 98 F (36.7 C)   Resp 16   Ht 5\' 11"  (1.803 m)   Wt 221 lb (100.2 kg)   SpO2 98%   BMI 30.82 kg/m  Wt Readings from Last 3 Encounters:  09/14/23 221 lb (100.2 kg)  06/15/23 221 lb 6.4 oz (100.4 kg)  11/29/22 236 lb (107 kg)    Physical Exam Constitutional:      General: He is not in acute distress.    Appearance: Normal appearance. He is well-developed.  HENT:     Head: Normocephalic and atraumatic.     Right Ear: External ear normal.     Left Ear: External ear normal.  Eyes:     General: No scleral icterus.       Right eye: No discharge.        Left eye: No discharge.  Cardiovascular:     Rate and Rhythm: Normal rate and regular rhythm.  Pulmonary:     Effort: Pulmonary effort is normal. No respiratory distress.     Breath sounds: Normal breath sounds.  Abdominal:     General: Bowel sounds are normal.     Palpations: Abdomen is soft.     Tenderness: There is no abdominal tenderness.  Musculoskeletal:        General: No swelling or tenderness.     Cervical back: Neck supple. No  tenderness.  Lymphadenopathy:     Cervical: No cervical adenopathy.  Skin:    Findings: No erythema or rash.  Neurological:     Mental Status: He is alert.  Psychiatric:        Mood and Affect: Mood normal.        Behavior: Behavior normal.  Outpatient Encounter Medications as of 09/14/2023  Medication Sig   amphetamine-dextroamphetamine (ADDERALL) 10 MG tablet Take 1 tablet (10 mg total) by mouth daily with breakfast.   [START ON 10/14/2023] amphetamine-dextroamphetamine (ADDERALL) 10 MG tablet Take 1 tablet (10 mg total) by mouth daily with breakfast.   [START ON 11/13/2023] amphetamine-dextroamphetamine (ADDERALL) 10 MG tablet Take 1 tablet (10 mg total) by mouth daily with breakfast.   Continuous Glucose Sensor (DEXCOM G7 SENSOR) MISC USE TO CHECK BLOOD SUGAR AS DIRECTED   JARDIANCE 25 MG TABS tablet TAKE 1 TABLET BY MOUTH DAILY BEFORE BREAKFAST   mometasone (ELOCON) 0.1 % cream Apply to aa's ears BID up to 5d/wk PRN.   montelukast (SINGULAIR) 10 MG tablet TAKE ONE TABLET EVERY DAY   Multiple Vitamin (MULTIVITAMIN) tablet Take 1 tablet by mouth daily.   ondansetron (ZOFRAN) 4 MG tablet Take 1 tablet (4 mg total) by mouth 2 (two) times daily as needed for nausea or vomiting.   Semaglutide, 1 MG/DOSE, (OZEMPIC, 1 MG/DOSE,) 4 MG/3ML SOPN INJECT 1MG  INTO THE SKIN ONCE WEEKLY AS DIRECTED   sildenafil (REVATIO) 20 MG tablet Take 2 tablets two hours before intercouse on an empty stomach.  Do not take with nitrates.   [DISCONTINUED] amLODipine (NORVASC) 10 MG tablet TAKE 1 TABLET BY MOUTH ONCE DAILY   [DISCONTINUED] busPIRone (BUSPAR) 5 MG tablet Take 1 tablet (5 mg total) by mouth 2 (two) times daily as needed.   [DISCONTINUED] hydrochlorothiazide (MICROZIDE) 12.5 MG capsule TAKE 1 CAPSULE BY MOUTH ONCE DAILY   [DISCONTINUED] lisinopril (ZESTRIL) 5 MG tablet TAKE 1 TABLET BY MOUTH DAILY   [DISCONTINUED] rosuvastatin (CRESTOR) 5 MG tablet TAKE 1 TABLET BY MOUTH ONCE DAILY   amLODipine  (NORVASC) 10 MG tablet Take 1 tablet (10 mg total) by mouth daily.   busPIRone (BUSPAR) 5 MG tablet Take 1 tablet (5 mg total) by mouth 2 (two) times daily as needed.   hydrochlorothiazide (MICROZIDE) 12.5 MG capsule Take 1 capsule (12.5 mg total) by mouth daily.   lisinopril (ZESTRIL) 5 MG tablet Take 1 tablet (5 mg total) by mouth daily.   rosuvastatin (CRESTOR) 5 MG tablet Take 1 tablet (5 mg total) by mouth daily.   [DISCONTINUED] amphetamine-dextroamphetamine (ADDERALL) 10 MG tablet Take 1 tablet (10 mg total) by mouth 2 (two) times daily.   [DISCONTINUED] amphetamine-dextroamphetamine (ADDERALL) 10 MG tablet Take 1 tablet (10 mg total) by mouth 2 (two) times daily.   [DISCONTINUED] amphetamine-dextroamphetamine (ADDERALL) 10 MG tablet Take 1 tablet (10 mg total) by mouth 2 (two) times daily.   [DISCONTINUED] predniSONE (DELTASONE) 10 MG tablet Take 4 tablets x 1 day and then decrease by 1/2 tablet per day until down to zero mg.   No facility-administered encounter medications on file as of 09/14/2023.     Lab Results  Component Value Date   WBC 7.7 03/28/2023   HGB 16.8 03/28/2023   HCT 49.4 03/28/2023   PLT 276.0 03/28/2023   GLUCOSE 136 (H) 07/20/2023   CHOL 106 07/20/2023   TRIG 99.0 07/20/2023   HDL 38.80 (L) 07/20/2023   LDLCALC 48 07/20/2023   ALT 25 07/20/2023   AST 15 07/20/2023   NA 138 07/20/2023   K 3.9 07/20/2023   CL 102 07/20/2023   CREATININE 1.05 07/20/2023   BUN 15 07/20/2023   CO2 28 07/20/2023   TSH 0.85 07/20/2023   PSA 0.41 03/28/2023   HGBA1C 6.9 (H) 07/20/2023   MICROALBUR <0.7 11/29/2022    MR ABDOMEN MRCP W WO  CONTAST  Result Date: 07/20/2020 CLINICAL DATA:  Epigastric pain for 1 month. History of pancreatitis. History of ethanol abuse. EXAM: MRI ABDOMEN WITHOUT AND WITH CONTRAST (INCLUDING MRCP) TECHNIQUE: Multiplanar multisequence MR imaging of the abdomen was performed both before and after the administration of intravenous contrast. Heavily  T2-weighted images of the biliary and pancreatic ducts were obtained, and three-dimensional MRCP images were rendered by post processing. CONTRAST:  9mL GADAVIST GADOBUTROL 1 MMOL/ML IV SOLN field COMPARISON:  Abdominal ultrasound 04/30/2020.  CT 05/02/2020. FINDINGS: Lower chest: Normal heart size without pericardial or pleural effusion. Hepatobiliary: Mild hepatomegaly and marked hepatic steatosis. 18.9 cm craniocaudal. No focal liver lesion. Normal gallbladder. No intra or extrahepatic biliary duct dilatation. No choledocholithiasis. Pancreas: No evidence of acute pancreatitis. No pancreatic duct dilatation. No pancreas divisum. Pancreas enhances normally. Spleen:  Normal in size, without focal abnormality. Adrenals/Urinary Tract: Normal adrenal glands. Normal kidneys, without hydronephrosis. Stomach/Bowel: Tiny hiatal hernia.  Normal abdominal bowel loops. Vascular/Lymphatic: Aortic atherosclerosis. No abdominal adenopathy. Other:  No ascites. Musculoskeletal: No acute osseous abnormality. IMPRESSION: 1. No evidence of acute pancreatitis or other acute abdominal process. No biliary duct dilatation or choledocholithiasis. 2. Hepatic steatosis and mild hepatomegaly. 3. Tiny hiatal hernia. Electronically Signed   By: Jeronimo Greaves M.D.   On: 07/20/2020 11:27   MR 3D Recon At Scanner  Result Date: 07/20/2020 CLINICAL DATA:  Epigastric pain for 1 month. History of pancreatitis. History of ethanol abuse. EXAM: MRI ABDOMEN WITHOUT AND WITH CONTRAST (INCLUDING MRCP) TECHNIQUE: Multiplanar multisequence MR imaging of the abdomen was performed both before and after the administration of intravenous contrast. Heavily T2-weighted images of the biliary and pancreatic ducts were obtained, and three-dimensional MRCP images were rendered by post processing. CONTRAST:  9mL GADAVIST GADOBUTROL 1 MMOL/ML IV SOLN field COMPARISON:  Abdominal ultrasound 04/30/2020.  CT 05/02/2020. FINDINGS: Lower chest: Normal heart size without  pericardial or pleural effusion. Hepatobiliary: Mild hepatomegaly and marked hepatic steatosis. 18.9 cm craniocaudal. No focal liver lesion. Normal gallbladder. No intra or extrahepatic biliary duct dilatation. No choledocholithiasis. Pancreas: No evidence of acute pancreatitis. No pancreatic duct dilatation. No pancreas divisum. Pancreas enhances normally. Spleen:  Normal in size, without focal abnormality. Adrenals/Urinary Tract: Normal adrenal glands. Normal kidneys, without hydronephrosis. Stomach/Bowel: Tiny hiatal hernia.  Normal abdominal bowel loops. Vascular/Lymphatic: Aortic atherosclerosis. No abdominal adenopathy. Other:  No ascites. Musculoskeletal: No acute osseous abnormality. IMPRESSION: 1. No evidence of acute pancreatitis or other acute abdominal process. No biliary duct dilatation or choledocholithiasis. 2. Hepatic steatosis and mild hepatomegaly. 3. Tiny hiatal hernia. Electronically Signed   By: Jeronimo Greaves M.D.   On: 07/20/2020 11:27       Assessment & Plan:  Type 2 diabetes mellitus with hyperglycemia, without long-term current use of insulin (HCC) Assessment & Plan: On ozempic. Tolerating 1mg  dose. Off metformin due to GI side effects.  GI issues much better off metformin. Continues on jardiance. Last A1c improved 6.9. Discussed continued diet and exercise.  Has lost weight. Trial of reader.    Orders: -     Hemoglobin A1c; Future -     Microalbumin / creatinine urine ratio; Future  Mixed hyperlipidemia Assessment & Plan: On crestor.  Low cholesterol diet and exercise.  Follow lipid panel and liver function tests.   Lab Results  Component Value Date   CHOL 106 07/20/2023   HDL 38.80 (L) 07/20/2023   LDLCALC 48 07/20/2023   TRIG 99.0 07/20/2023   CHOLHDL 3 07/20/2023     Orders: -  Lipid panel; Future -     Hepatic function panel; Future  Hypertension, essential Assessment & Plan: Continue hctz and amlodipine.  Follow pressures.  Follow metabolic panel.    Orders: -     Basic metabolic panel; Future  Attention deficit hyperactivity disorder (ADHD), unspecified ADHD type Assessment & Plan: Has done well on adderall.  Feels doing well.  Follow. Continue current dose.    Mild persistent asthma without complication Assessment & Plan: Has seen Dr Cortland Callas.  Continue singulair, symbicort and astelin.  Breathing stable.    Elevated hemoglobin (HCC) Assessment & Plan: Last hgb 16.8.  recheck cbc with next labs.    Hepatic steatosis Assessment & Plan: Found on abdominal ultrasound.  Diet, exercise. and weight loss.  Follow liver function tests.       Other orders -     amLODIPine Besylate; Take 1 tablet (10 mg total) by mouth daily.  Dispense: 90 tablet; Refill: 1 -     busPIRone HCl; Take 1 tablet (5 mg total) by mouth 2 (two) times daily as needed.  Dispense: 60 tablet; Refill: 1 -     hydroCHLOROthiazide; Take 1 capsule (12.5 mg total) by mouth daily.  Dispense: 90 capsule; Refill: 1 -     Lisinopril; Take 1 tablet (5 mg total) by mouth daily.  Dispense: 90 tablet; Refill: 1 -     Rosuvastatin Calcium; Take 1 tablet (5 mg total) by mouth daily.  Dispense: 90 tablet; Refill: 3 -     Amphetamine-Dextroamphetamine; Take 1 tablet (10 mg total) by mouth daily with breakfast.  Dispense: 30 tablet; Refill: 0 -     Amphetamine-Dextroamphetamine; Take 1 tablet (10 mg total) by mouth daily with breakfast.  Dispense: 30 tablet; Refill: 0 -     Amphetamine-Dextroamphetamine; Take 1 tablet (10 mg total) by mouth daily with breakfast.  Dispense: 30 tablet; Refill: 0     Dale Dongola, MD

## 2023-09-14 NOTE — Assessment & Plan Note (Signed)
Continue hctz and amlodipine.  Follow pressures.  Follow metabolic panel.

## 2023-09-14 NOTE — Assessment & Plan Note (Signed)
On crestor.  Low cholesterol diet and exercise.  Follow lipid panel and liver function tests.   Lab Results  Component Value Date   CHOL 106 07/20/2023   HDL 38.80 (L) 07/20/2023   LDLCALC 48 07/20/2023   TRIG 99.0 07/20/2023   CHOLHDL 3 07/20/2023

## 2023-09-14 NOTE — Assessment & Plan Note (Signed)
Has done well on adderall.  Feels doing well.  Follow. Continue current dose.

## 2023-09-20 LAB — HM DIABETES EYE EXAM

## 2023-09-22 ENCOUNTER — Encounter: Payer: Self-pay | Admitting: Internal Medicine

## 2023-09-28 ENCOUNTER — Other Ambulatory Visit: Payer: Self-pay | Admitting: Internal Medicine

## 2023-12-06 ENCOUNTER — Other Ambulatory Visit: Payer: Self-pay | Admitting: Internal Medicine

## 2023-12-10 ENCOUNTER — Other Ambulatory Visit: Payer: Self-pay | Admitting: Internal Medicine

## 2023-12-13 ENCOUNTER — Other Ambulatory Visit: Payer: 59

## 2023-12-13 DIAGNOSIS — I10 Essential (primary) hypertension: Secondary | ICD-10-CM

## 2023-12-13 DIAGNOSIS — E782 Mixed hyperlipidemia: Secondary | ICD-10-CM

## 2023-12-13 DIAGNOSIS — E1165 Type 2 diabetes mellitus with hyperglycemia: Secondary | ICD-10-CM

## 2023-12-13 LAB — BASIC METABOLIC PANEL
BUN: 20 mg/dL (ref 6–23)
CO2: 26 meq/L (ref 19–32)
Calcium: 9.2 mg/dL (ref 8.4–10.5)
Chloride: 103 meq/L (ref 96–112)
Creatinine, Ser: 1.08 mg/dL (ref 0.40–1.50)
GFR: 80.61 mL/min (ref >60.00)
Glucose, Bld: 184 mg/dL — ABNORMAL HIGH (ref 70–99)
Potassium: 4.2 meq/L (ref 3.5–5.1)
Sodium: 136 meq/L (ref 135–145)

## 2023-12-13 LAB — LIPID PANEL
Cholesterol: 140 mg/dL (ref 0–200)
HDL: 38.5 mg/dL — ABNORMAL LOW (ref >39.00)
LDL Cholesterol: 76 mg/dL (ref 0–99)
NonHDL: 101.27
Total CHOL/HDL Ratio: 4
Triglycerides: 128 mg/dL (ref 0.0–149.0)
VLDL: 25.6 mg/dL (ref 0.0–40.0)

## 2023-12-13 LAB — HEPATIC FUNCTION PANEL
ALT: 26 U/L (ref 0–53)
AST: 19 U/L (ref 0–37)
Albumin: 4.4 g/dL (ref 3.5–5.2)
Alkaline Phosphatase: 67 U/L (ref 39–117)
Bilirubin, Direct: 0.1 mg/dL (ref 0.0–0.3)
Total Bilirubin: 0.6 mg/dL (ref 0.2–1.2)
Total Protein: 7.2 g/dL (ref 6.0–8.3)

## 2023-12-13 LAB — MICROALBUMIN / CREATININE URINE RATIO
Creatinine,U: 106.2 mg/dL
Microalb Creat Ratio: 6.6 mg/g (ref 0.0–30.0)
Microalb, Ur: <0.7 mg/dL (ref 0.0–1.9)

## 2023-12-13 LAB — HEMOGLOBIN A1C: Hgb A1c MFr Bld: 6.9 % — ABNORMAL HIGH (ref 4.6–6.5)

## 2023-12-15 ENCOUNTER — Ambulatory Visit: Payer: 59 | Admitting: Internal Medicine

## 2023-12-15 ENCOUNTER — Encounter: Payer: Self-pay | Admitting: Internal Medicine

## 2023-12-15 VITALS — BP 110/68 | HR 80 | Temp 98.0°F | Resp 16 | Ht 71.0 in | Wt 221.6 lb

## 2023-12-15 DIAGNOSIS — I10 Essential (primary) hypertension: Secondary | ICD-10-CM

## 2023-12-15 DIAGNOSIS — Z7984 Long term (current) use of oral hypoglycemic drugs: Secondary | ICD-10-CM | POA: Diagnosis not present

## 2023-12-15 DIAGNOSIS — F439 Reaction to severe stress, unspecified: Secondary | ICD-10-CM | POA: Diagnosis not present

## 2023-12-15 DIAGNOSIS — Z125 Encounter for screening for malignant neoplasm of prostate: Secondary | ICD-10-CM

## 2023-12-15 DIAGNOSIS — K760 Fatty (change of) liver, not elsewhere classified: Secondary | ICD-10-CM

## 2023-12-15 DIAGNOSIS — E1165 Type 2 diabetes mellitus with hyperglycemia: Secondary | ICD-10-CM | POA: Diagnosis not present

## 2023-12-15 DIAGNOSIS — J4530 Mild persistent asthma, uncomplicated: Secondary | ICD-10-CM | POA: Diagnosis not present

## 2023-12-15 DIAGNOSIS — E782 Mixed hyperlipidemia: Secondary | ICD-10-CM | POA: Diagnosis not present

## 2023-12-15 DIAGNOSIS — Z136 Encounter for screening for cardiovascular disorders: Secondary | ICD-10-CM

## 2023-12-15 DIAGNOSIS — F909 Attention-deficit hyperactivity disorder, unspecified type: Secondary | ICD-10-CM

## 2023-12-15 DIAGNOSIS — Z7985 Long-term (current) use of injectable non-insulin antidiabetic drugs: Secondary | ICD-10-CM | POA: Diagnosis not present

## 2023-12-15 MED ORDER — AMPHETAMINE-DEXTROAMPHETAMINE 10 MG PO TABS: 10.0000 mg | ORAL_TABLET | Freq: Every day | ORAL | 0 refills | Status: DC

## 2023-12-15 MED ORDER — AMLODIPINE BESYLATE 10 MG PO TABS: 10.0000 mg | ORAL_TABLET | Freq: Every day | ORAL | 1 refills | Status: DC

## 2023-12-15 MED ORDER — LISINOPRIL 5 MG PO TABS: 5.0000 mg | ORAL_TABLET | Freq: Every day | ORAL | 1 refills | Status: DC

## 2023-12-15 MED ORDER — FREESTYLE LIBRE 3 PLUS SENSOR MISC: 5 refills | Status: DC

## 2023-12-15 MED ORDER — BUSPIRONE HCL 5 MG PO TABS: 5.0000 mg | ORAL_TABLET | Freq: Two times a day (BID) | ORAL | 1 refills | Status: DC | PRN

## 2023-12-15 MED ORDER — HYDROCHLOROTHIAZIDE 12.5 MG PO CAPS: 12.5000 mg | ORAL_CAPSULE | Freq: Every day | ORAL | 1 refills | Status: DC

## 2023-12-15 NOTE — Progress Notes (Signed)
 Subjective:    Patient ID: Francisco Jackson, male    DOB: [birth year], 48 y.o.   MRN: [id number]  Patient here for  Chief Complaint  Patient presents with   Medical Management of Chronic Issues    HPI Here for a scheduled follow up -follow up regarding diabetes, hypertension and ADHD. Continues on jardiance and ozempic. Has missed some doses of ozempic. Low carb diet and exercise. No chest pain or sob reported. No cough or congestion. No abdominal pain or bowel change reported.    Past Medical History:  Diagnosis Date   Asthma    Atypical mole 04/05/2013   Left forearm. Atypical combined nevus, moderate atypia.   Diabetes mellitus without complication (HCC)    Dysplastic nevus 04/23/2014   Left prox. mid. tricep axilla. Mild atypia, margins free.   Hypertension    Personality disorder (HCC)    borderline   Past Surgical History:  Procedure Laterality Date   ANTERIOR CRUCIATE LIGAMENT REPAIR Right 2006   CERVICAL DISC SURGERY  2010   COLONOSCOPY WITH PROPOFOL N/A 11/01/2019   Procedure: COLONOSCOPY WITH PROPOFOL;  Surgeon: Wyline Mood, MD;  Location: [facility] ENDOSCOPY;  Service: Gastroenterology;  Laterality: N/A;   MENISCUS REPAIR Left 1996   Family History  Problem Relation Age of Onset   Diabetes Mother    Melanoma Mother    Diabetes Paternal Aunt    Heart attack Paternal Aunt    Diabetes Paternal Uncle    Diabetes Maternal Grandmother    Diabetes Maternal Grandfather    Heart disease Maternal Grandfather    Heart attack Maternal Grandfather    Heart attack Father    Hypertension Father    Diabetes Sister    Hypertension Sister    Kidney cancer Neg Hx    Kidney disease Neg Hx    Prostate cancer Neg Hx    Social History   Socioeconomic History   Marital status: Married    Spouse name: Gunnar Fusi   Number of children: Not on file   Years of education: Not on file   Highest education level: Associate degree: occupational, Scientist, product/process development, or vocational program   Occupational History   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   Tobacco Use   Smoking status: Former    Current packs/day: 0.00    Average packs/day: 0.5 packs/day for 10.0 years (5.0 ttl pk-yrs)    Types: Cigarettes    Start date: 10/11/1992    Quit date: 10/11/2002    Years since quitting: 21.2   Smokeless tobacco: Former    Types: Snuff    Quit date: 10/11/2009  Vaping Use   Vaping status: Never Used  Substance and Sexual Activity   Alcohol use: Yes    Alcohol/week: 4.0 standard drinks of alcohol    Types: 4 Glasses of wine per week    Comment: ocassional   Drug use: No   Sexual activity: Not on file  Other Topics Concern   Not on file  Social History Narrative   Not on file   Social Drivers of Health   Financial Resource Strain: Low Risk  (03/10/2023)   Overall Financial Resource Strain (CARDIA)    Difficulty of Paying Living Expenses: Not hard at all  Food Insecurity: No Food Insecurity (03/10/2023)   Hunger Vital Sign    Worried About Running Out of Food in the Last Year: Never true    Ran Out of Food in the Last Year: Never true  Transportation Needs:  No Transportation Needs (03/10/2023)   PRAPARE - Administrator, Civil Service (Medical): No    Lack of Transportation (Non-Medical): No  Physical Activity: Insufficiently Active (03/10/2023)   Exercise Vital Sign    Days of Exercise per Week: 3 days    Minutes of Exercise per Session: 30 min  Stress: No Stress Concern Present (03/10/2023)   [employer] of Occupational Health - Occupational Stress Questionnaire    Feeling of Stress : Only a little  Social Connections: Socially Integrated (03/10/2023)   Social Connection and Isolation Panel [NHANES]    Frequency of Communication with Friends and Family: More than three times a week    Frequency of Social Gatherings with Friends and Family: More than three times a week    Attends Religious Services: More than 4 times per year    Active  Member of [employer] or Organizations: Yes    Attends Engineer, structural: More than 4 times per year    Marital Status: Married     Review of Systems  Constitutional:  Negative for appetite change and unexpected weight change.  HENT:  Negative for congestion and sinus pressure.   Respiratory:  Negative for cough, chest tightness and shortness of breath.   Cardiovascular:  Negative for chest pain, palpitations and leg swelling.  Gastrointestinal:  Negative for abdominal pain, diarrhea, nausea and vomiting.  Genitourinary:  Negative for difficulty urinating and dysuria.  Musculoskeletal:  Negative for joint swelling and myalgias.  Skin:  Negative for color change and rash.  Neurological:  Negative for dizziness and headaches.  Psychiatric/Behavioral:  Negative for agitation and dysphoric mood.        Objective:     BP 110/68   Pulse 80   Temp 98 F (36.7 C)   Resp 16   Ht 5\' 11"  (1.803 m)   Wt 221 lb 9.6 oz (100.5 kg)   SpO2 98%   BMI 30.91 kg/m  Wt Readings from Last 3 Encounters:  12/15/23 221 lb 9.6 oz (100.5 kg)  09/14/23 221 lb (100.2 kg)  06/15/23 221 lb 6.4 oz (100.4 kg)    Physical Exam Vitals reviewed.  Constitutional:      General: He is not in acute distress.    Appearance: Normal appearance. He is well-developed.  HENT:     Head: Normocephalic and atraumatic.     Right Ear: External ear normal.     Left Ear: External ear normal.     Mouth/Throat:     Pharynx: No oropharyngeal exudate or posterior oropharyngeal erythema.  Eyes:     General: No scleral icterus.       Right eye: No discharge.        Left eye: No discharge.     Conjunctiva/sclera: Conjunctivae normal.  Cardiovascular:     Rate and Rhythm: Normal rate and regular rhythm.  Pulmonary:     Effort: Pulmonary effort is normal. No respiratory distress.     Breath sounds: Normal breath sounds.  Abdominal:     General: Bowel sounds are normal.     Palpations: Abdomen is soft.      Tenderness: There is no abdominal tenderness.  Musculoskeletal:        General: No swelling or tenderness.     Cervical back: Neck supple. No tenderness.  Lymphadenopathy:     Cervical: No cervical adenopathy.  Skin:    Findings: No erythema or rash.  Neurological:     Mental Status: He is alert.  Psychiatric:        Mood and Affect: Mood normal.        Behavior: Behavior normal.         Outpatient Encounter Medications as of 12/15/2023  Medication Sig   amphetamine-dextroamphetamine (ADDERALL) 10 MG tablet Take 1 tablet (10 mg total) by mouth daily with breakfast.   [START ON 01/14/2024] amphetamine-dextroamphetamine (ADDERALL) 10 MG tablet Take 1 tablet (10 mg total) by mouth daily with breakfast.   [START ON 02/13/2024] amphetamine-dextroamphetamine (ADDERALL) 10 MG tablet Take 1 tablet (10 mg total) by mouth daily with breakfast.   Continuous Glucose Sensor (FREESTYLE LIBRE 3 PLUS SENSOR) MISC Change sensor every 15 days.   amLODipine (NORVASC) 10 MG tablet Take 1 tablet (10 mg total) by mouth daily.   busPIRone (BUSPAR) 5 MG tablet Take 1 tablet (5 mg total) by mouth 2 (two) times daily as needed.   hydrochlorothiazide (MICROZIDE) 12.5 MG capsule Take 1 capsule (12.5 mg total) by mouth daily.   JARDIANCE 25 MG TABS tablet TAKE 1 TABLET BY MOUTH DAILY BEFORE BREAKFAST   lisinopril (ZESTRIL) 5 MG tablet Take 1 tablet (5 mg total) by mouth daily.   mometasone (ELOCON) 0.1 % cream Apply to aa's ears BID up to 5d/wk PRN.   montelukast (SINGULAIR) 10 MG tablet TAKE ONE TABLET EVERY DAY   Multiple Vitamin (MULTIVITAMIN) tablet Take 1 tablet by mouth daily.   ondansetron (ZOFRAN) 4 MG tablet Take 1 tablet (4 mg total) by mouth 2 (two) times daily as needed for nausea or vomiting.   rosuvastatin (CRESTOR) 5 MG tablet Take 1 tablet (5 mg total) by mouth daily.   Semaglutide, 1 MG/DOSE, (OZEMPIC, 1 MG/DOSE,) 4 MG/3ML SOPN INJECT 1MG  INTO THE SKIN ONCE WEEKLY AS DIRECTED   sildenafil (REVATIO)  20 MG tablet Take 2 tablets two hours before intercouse on an empty stomach.  Do not take with nitrates.   [DISCONTINUED] amLODipine (NORVASC) 10 MG tablet Take 1 tablet (10 mg total) by mouth daily.   [DISCONTINUED] amphetamine-dextroamphetamine (ADDERALL) 10 MG tablet Take 1 tablet (10 mg total) by mouth daily with breakfast.   [DISCONTINUED] amphetamine-dextroamphetamine (ADDERALL) 10 MG tablet Take 1 tablet (10 mg total) by mouth daily with breakfast.   [DISCONTINUED] amphetamine-dextroamphetamine (ADDERALL) 10 MG tablet Take 1 tablet (10 mg total) by mouth daily with breakfast.   [DISCONTINUED] busPIRone (BUSPAR) 5 MG tablet Take 1 tablet (5 mg total) by mouth 2 (two) times daily as needed.   [DISCONTINUED] Continuous Glucose Sensor (DEXCOM G7 SENSOR) MISC USE TO CHECK BLOOD SUGAR AS DIRECTED   [DISCONTINUED] hydrochlorothiazide (MICROZIDE) 12.5 MG capsule Take 1 capsule (12.5 mg total) by mouth daily.   [DISCONTINUED] lisinopril (ZESTRIL) 5 MG tablet Take 1 tablet (5 mg total) by mouth daily.   No facility-administered encounter medications on file as of 12/15/2023.     Lab Results  Component Value Date   WBC 7.7 03/28/2023   HGB 16.8 03/28/2023   HCT 49.4 03/28/2023   PLT 276.0 03/28/2023   GLUCOSE 184 (H) 12/13/2023   CHOL 140 12/13/2023   TRIG 128.0 12/13/2023   HDL 38.50 (L) 12/13/2023   LDLCALC 76 12/13/2023   ALT 26 12/13/2023   AST 19 12/13/2023   NA 136 12/13/2023   K 4.2 12/13/2023   CL 103 12/13/2023   CREATININE 1.08 12/13/2023   BUN 20 12/13/2023   CO2 26 12/13/2023   TSH 0.85 07/20/2023   PSA 0.41 03/28/2023   HGBA1C 6.9 (H) 12/13/2023   MICROALBUR <  0.7 12/13/2023    MR ABDOMEN MRCP W WO CONTAST Result Date: 07/20/2020 CLINICAL DATA:  Epigastric pain for 1 month. History of pancreatitis. History of ethanol abuse. EXAM: MRI ABDOMEN WITHOUT AND WITH CONTRAST (INCLUDING MRCP) TECHNIQUE: Multiplanar multisequence MR imaging of the abdomen was performed both before  and after the administration of intravenous contrast. Heavily T2-weighted images of the biliary and pancreatic ducts were obtained, and three-dimensional MRCP images were rendered by post processing. CONTRAST:  9mL GADAVIST GADOBUTROL 1 MMOL/ML IV SOLN field COMPARISON:  Abdominal ultrasound 04/30/2020.  CT 05/02/2020. FINDINGS: Lower chest: Normal heart size without pericardial or pleural effusion. Hepatobiliary: Mild hepatomegaly and marked hepatic steatosis. 18.9 cm craniocaudal. No focal liver lesion. Normal gallbladder. No intra or extrahepatic biliary duct dilatation. No choledocholithiasis. Pancreas: No evidence of acute pancreatitis. No pancreatic duct dilatation. No pancreas divisum. Pancreas enhances normally. Spleen:  Normal in size, without focal abnormality. Adrenals/Urinary Tract: Normal adrenal glands. Normal kidneys, without hydronephrosis. Stomach/Bowel: Tiny hiatal hernia.  Normal abdominal bowel loops. Vascular/Lymphatic: Aortic atherosclerosis. No abdominal adenopathy. Other:  No ascites. Musculoskeletal: No acute osseous abnormality. IMPRESSION: 1. No evidence of acute pancreatitis or other acute abdominal process. No biliary duct dilatation or choledocholithiasis. 2. Hepatic steatosis and mild hepatomegaly. 3. Tiny hiatal hernia. Electronically Signed   By: Jeronimo Greaves M.D.   On: 07/20/2020 11:27   MR 3D Recon At Scanner Result Date: 07/20/2020 CLINICAL DATA:  Epigastric pain for 1 month. History of pancreatitis. History of ethanol abuse. EXAM: MRI ABDOMEN WITHOUT AND WITH CONTRAST (INCLUDING MRCP) TECHNIQUE: Multiplanar multisequence MR imaging of the abdomen was performed both before and after the administration of intravenous contrast. Heavily T2-weighted images of the biliary and pancreatic ducts were obtained, and three-dimensional MRCP images were rendered by post processing. CONTRAST:  9mL GADAVIST GADOBUTROL 1 MMOL/ML IV SOLN field COMPARISON:  Abdominal ultrasound 04/30/2020.  CT  05/02/2020. FINDINGS: Lower chest: Normal heart size without pericardial or pleural effusion. Hepatobiliary: Mild hepatomegaly and marked hepatic steatosis. 18.9 cm craniocaudal. No focal liver lesion. Normal gallbladder. No intra or extrahepatic biliary duct dilatation. No choledocholithiasis. Pancreas: No evidence of acute pancreatitis. No pancreatic duct dilatation. No pancreas divisum. Pancreas enhances normally. Spleen:  Normal in size, without focal abnormality. Adrenals/Urinary Tract: Normal adrenal glands. Normal kidneys, without hydronephrosis. Stomach/Bowel: Tiny hiatal hernia.  Normal abdominal bowel loops. Vascular/Lymphatic: Aortic atherosclerosis. No abdominal adenopathy. Other:  No ascites. Musculoskeletal: No acute osseous abnormality. IMPRESSION: 1. No evidence of acute pancreatitis or other acute abdominal process. No biliary duct dilatation or choledocholithiasis. 2. Hepatic steatosis and mild hepatomegaly. 3. Tiny hiatal hernia. Electronically Signed   By: Jeronimo Greaves M.D.   On: 07/20/2020 11:27       Assessment & Plan:  Encounter for screening for coronary artery disease Assessment & Plan: Discussed screening. Agreeable for CT calcium score.   Orders: -     CT CARDIAC SCORING (SELF PAY ONLY); Future  Mixed hyperlipidemia Assessment & Plan: Continue crestor. Low cholesterol diet and exercise. Follow lipid panel and liver function tests.   Lab Results  Component Value Date   CHOL 140 12/13/2023   HDL 38.50 (L) 12/13/2023   LDLCALC 76 12/13/2023   TRIG 128.0 12/13/2023   CHOLHDL 4 12/13/2023     Orders: -     Lipid panel; Future -     Hepatic function panel; Future -     CBC with Differential/Platelet; Future  Type 2 diabetes mellitus with hyperglycemia, without long-term current use of insulin (HCC) Assessment &  Plan: Continues on ozempic. Off metformin. Continues on jardiance.  Continue diet and exercise. Follow met b and A1c.   Lab Results  Component Value Date    HGBA1C 6.9 (H) 12/13/2023     Orders: -     Hemoglobin A1c; Future  Hypertension, essential Assessment & Plan: Continue hydrochlorothiazide and amlodipine. No change today in medication. Follow pressures.  Follow metabolic panel.   Orders: -     Basic metabolic panel; Future  Prostate cancer screening -     PSA; Future  Stress Assessment & Plan: Overall appears to be doing well.  Buspar. Follow.    Hepatic steatosis Assessment & Plan: Found on abdominal ultrasound. Continue diet, exercise and weight loss. Follow liver function tests.    Attention deficit hyperactivity disorder (ADHD), unspecified ADHD type Assessment & Plan: Continue adderall. Stable. Continue current dose.    Mild persistent asthma without complication Assessment & Plan: Has seen Dr [place] Callas.  Continue singulair, symbicort and astelin.  Breathing stable. Continue current medication regimen.    Other orders -     amLODIPine Besylate; Take 1 tablet (10 mg total) by mouth daily.  Dispense: 90 tablet; Refill: 1 -     busPIRone HCl; Take 1 tablet (5 mg total) by mouth 2 (two) times daily as needed.  Dispense: 180 tablet; Refill: 1 -     hydroCHLOROthiazide; Take 1 capsule (12.5 mg total) by mouth daily.  Dispense: 90 capsule; Refill: 1 -     Lisinopril; Take 1 tablet (5 mg total) by mouth daily.  Dispense: 90 tablet; Refill: 1 -     Amphetamine-Dextroamphetamine; Take 1 tablet (10 mg total) by mouth daily with breakfast.  Dispense: 30 tablet; Refill: 0 -     Amphetamine-Dextroamphetamine; Take 1 tablet (10 mg total) by mouth daily with breakfast.  Dispense: 30 tablet; Refill: 0 -     Amphetamine-Dextroamphetamine; Take 1 tablet (10 mg total) by mouth daily with breakfast.  Dispense: 30 tablet; Refill: 0 -     FreeStyle Libre 3 Plus Sensor; Change sensor every 15 days.  Dispense: 2 each; Refill: 5     Dale Framingham, MD

## 2023-12-18 ENCOUNTER — Encounter: Payer: Self-pay | Admitting: Internal Medicine

## 2023-12-18 DIAGNOSIS — Z136 Encounter for screening for cardiovascular disorders: Secondary | ICD-10-CM | POA: Insufficient documentation

## 2023-12-18 NOTE — Assessment & Plan Note (Signed)
 Continue crestor. Low cholesterol diet and exercise. Follow lipid panel and liver function tests.   Lab Results  Component Value Date   CHOL 140 12/13/2023   HDL 38.50 (L) 12/13/2023   LDLCALC 76 12/13/2023   TRIG 128.0 12/13/2023   CHOLHDL 4 12/13/2023

## 2023-12-18 NOTE — Assessment & Plan Note (Signed)
 Found on abdominal ultrasound. Continue diet, exercise and weight loss. Follow liver function tests.

## 2023-12-18 NOTE — Assessment & Plan Note (Signed)
 Overall appears to be doing well.  Buspar. Follow.

## 2023-12-18 NOTE — Assessment & Plan Note (Signed)
 Continue adderall. Stable. Continue current dose.

## 2023-12-18 NOTE — Assessment & Plan Note (Signed)
 Has seen Dr [place] Callas.  Continue singulair, symbicort and astelin.  Breathing stable. Continue current medication regimen.

## 2023-12-18 NOTE — Assessment & Plan Note (Signed)
 Discussed screening. Agreeable for CT calcium score.

## 2023-12-18 NOTE — Assessment & Plan Note (Signed)
 Continues on ozempic. Off metformin. Continues on jardiance.  Continue diet and exercise. Follow met b and A1c.   Lab Results  Component Value Date   HGBA1C 6.9 (H) 12/13/2023

## 2023-12-18 NOTE — Assessment & Plan Note (Signed)
 Continue hydrochlorothiazide and amlodipine. No change today in medication. Follow pressures.  Follow metabolic panel.

## 2023-12-28 ENCOUNTER — Inpatient Hospital Stay: Admission: RE | Admit: 2023-12-28 | Source: Ambulatory Visit

## 2024-01-02 ENCOUNTER — Ambulatory Visit
Admission: RE | Admit: 2024-01-02 | Discharge: 2024-01-02 | Disposition: A | Discharge status: Home / Self Care | Payer: Self-pay | Source: Ambulatory Visit | Attending: Internal Medicine | Admitting: Internal Medicine

## 2024-01-02 DIAGNOSIS — Z136 Encounter for screening for cardiovascular disorders: Secondary | ICD-10-CM | POA: Insufficient documentation

## 2024-01-04 ENCOUNTER — Other Ambulatory Visit: Payer: Self-pay | Admitting: Internal Medicine

## 2024-01-04 MED ORDER — ROSUVASTATIN CALCIUM 10 MG PO TABS: 10.0000 mg | ORAL_TABLET | Freq: Every day | ORAL | 1 refills | Status: DC

## 2024-01-04 NOTE — Progress Notes (Signed)
 Rx sent in for for crestor 10mg .

## 2024-02-05 ENCOUNTER — Other Ambulatory Visit: Payer: Self-pay | Admitting: Internal Medicine

## 2024-02-15 ENCOUNTER — Encounter: Payer: Self-pay | Admitting: Internal Medicine

## 2024-02-15 NOTE — Telephone Encounter (Signed)
 Called and spoke to Francisco Jackson regarding his mother. Updated on her condition. He will let me know if needs anything and will keep us  posted.

## 2024-02-16 NOTE — Telephone Encounter (Signed)
 Called and spoke to Kashon to thank him for the update. Advised him to keep us  posted and let us  know if he needs anything.

## 2024-02-26 ENCOUNTER — Other Ambulatory Visit: Payer: Self-pay | Admitting: Internal Medicine

## 2024-02-27 DIAGNOSIS — M778 Other enthesopathies, not elsewhere classified: Secondary | ICD-10-CM | POA: Diagnosis not present

## 2024-02-27 DIAGNOSIS — M25521 Pain in right elbow: Secondary | ICD-10-CM | POA: Diagnosis not present

## 2024-03-06 DIAGNOSIS — M25521 Pain in right elbow: Secondary | ICD-10-CM | POA: Diagnosis not present

## 2024-03-13 ENCOUNTER — Encounter: Payer: Self-pay | Admitting: Internal Medicine

## 2024-03-13 DIAGNOSIS — M7711 Lateral epicondylitis, right elbow: Secondary | ICD-10-CM | POA: Insufficient documentation

## 2024-03-13 DIAGNOSIS — M25521 Pain in right elbow: Secondary | ICD-10-CM | POA: Diagnosis not present

## 2024-04-04 ENCOUNTER — Other Ambulatory Visit: Payer: Self-pay | Admitting: Internal Medicine

## 2024-04-18 ENCOUNTER — Other Ambulatory Visit: Payer: Self-pay | Admitting: Internal Medicine

## 2024-04-18 NOTE — Telephone Encounter (Signed)
 I have ok'd rx for one month adderall. He is overdue f/u appt. Need to schedule before next appt.

## 2024-04-20 NOTE — Telephone Encounter (Signed)
 Please schedule him for an appointment with Dr Gl[place]ia within 30 days.

## 2024-05-03 ENCOUNTER — Other Ambulatory Visit: Payer: Self-pay | Admitting: Internal Medicine

## 2024-05-21 ENCOUNTER — Other Ambulatory Visit: Payer: Self-pay | Admitting: Internal Medicine

## 2024-05-21 ENCOUNTER — Ambulatory Visit: Admitting: Internal Medicine

## 2024-05-21 NOTE — Progress Notes (Deleted)
 Subjective:    Patient ID: Francisco Jackson, male    DOB: [birth year], 48 y.o.   MRN: [id number]  Patient here for No chief complaint on file.   HPI Here for a scheduled follow up - follow up regarding diabetes, hypertension and ADHD. Continues on jardiance  and ozempic . Saw ortho 03/13/24 - right lateral epicondylitis. S/p injection (steroid) and wrist brace.    Past Medical History:  Diagnosis Date   Asthma    Atypical mole 04/05/2013   Left forearm. Atypical combined nevus, moderate atypia.   Diabetes mellitus without complication (HCC)    Dysplastic nevus 04/23/2014   Left prox. mid. tricep axilla. Mild atypia, margins free.   Hypertension    Personality disorder (HCC)    borderline   Past Surgical History:  Procedure Laterality Date   ANTERIOR CRUCIATE LIGAMENT REPAIR Right 2006   CERVICAL DISC SURGERY  2010   COLONOSCOPY WITH PROPOFOL  N/A 11/01/2019   Procedure: COLONOSCOPY WITH PROPOFOL ;  Surgeon: Therisa Bi, MD;  Location: [facility];  Service: Gastroenterology;  Laterality: N/A;   MENISCUS REPAIR Left 1996   Family History  Problem Relation Age of Onset   Diabetes Mother    Melanoma Mother    Diabetes Paternal Aunt    Heart attack Paternal Aunt    Diabetes Paternal Uncle    Diabetes Maternal Grandmother    Diabetes Maternal Grandfather    Heart disease Maternal Grandfather    Heart attack Maternal Grandfather    Heart attack Father    Hypertension Father    Diabetes Sister    Hypertension Sister    Kidney cancer Neg Hx    Kidney disease Neg Hx    Prostate cancer Neg Hx    Social History   Socioeconomic History   Marital status: Married    Spouse name: Vina   Number of children: Not on file   Years of education: Not on file   Highest education level: Associate degree: occupational, Scientist, product/process development, or vocational program  Occupational History   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   Tobacco Use   Smoking status: Former     Current packs/day: 0.00    Average packs/day: 0.5 packs/day for 10.0 years (5.0 ttl pk-yrs)    Types: Cigarettes    Start date: 10/11/1992    Quit date: 10/11/2002    Years since quitting: 21.6   Smokeless tobacco: Former    Types: Snuff    Quit date: 10/11/2009  Vaping Use   Vaping status: Never Used  Substance and Sexual Activity   Alcohol use: Yes    Alcohol/week: 4.0 standard drinks of alcohol    Types: 4 Glasses of wine per week    Comment: ocassional   Drug use: No   Sexual activity: Not on file  Other Topics Concern   Not on file  Social History Narrative   Not on file   Social Drivers of Health   Financial Resource Strain: Low Risk  (03/10/2023)   Overall Financial Resource Strain (CARDIA)    Difficulty of Paying Living Expenses: Not hard at all  Food Insecurity: No Food Insecurity (03/10/2023)   Hunger Vital Sign    Worried About Running Out of Food in the Last Year: Never true    Ran Out of Food in the Last Year: Never true  Transportation Needs: No Transportation Needs (03/10/2023)   PRAPARE - Administrator, Civil Service (Medical): No    Lack of Transportation (Non-Medical):  No  Physical Activity: Insufficiently Active (03/10/2023)   Exercise Vital Sign    Days of Exercise per Week: 3 days    Minutes of Exercise per Session: 30 min  Stress: No Stress Concern Present (03/10/2023)   [employer] of Occupational Health - Occupational Stress Questionnaire    Feeling of Stress : Only a little  Social Connections: Socially Integrated (03/10/2023)   Social Connection and Isolation Panel    Frequency of Communication with Friends and Family: More than three times a week    Frequency of Social Gatherings with Friends and Family: More than three times a week    Attends Religious Services: More than 4 times per year    Active Member of [employer] or Organizations: Yes    Attends Engineer, structural: More than 4 times per year    Marital Status: Married      Review of Systems     Objective:     There were no vitals taken for this visit. Wt Readings from Last 3 Encounters:  12/15/23 221 lb 9.6 oz (100.5 kg)  09/14/23 221 lb (100.2 kg)  06/15/23 221 lb 6.4 oz (100.4 kg)    Physical Exam  {Perform Simple Foot Exam  Perform Detailed exam:1} {Insert foot Exam (Optional):30965}   Outpatient Encounter Medications as of 05/21/2024  Medication Sig   amLODipine  (NORVASC ) 10 MG tablet Take 1 tablet (10 mg total) by mouth daily.   amphetamine -dextroamphetamine  (ADDERALL) 10 MG tablet Take 1 tablet (10 mg total) by mouth daily with breakfast.   amphetamine -dextroamphetamine  (ADDERALL) 10 MG tablet Take 1 tablet (10 mg total) by mouth daily with breakfast.   amphetamine -dextroamphetamine  (ADDERALL) 10 MG tablet TAKE 1 TABLET BY MOUTH ONCE DAILY WITH BREAKFAST   busPIRone  (BUSPAR ) 5 MG tablet Take 1 tablet (5 mg total) by mouth 2 (two) times daily as needed.   Continuous Glucose Sensor (FREESTYLE LIBRE 3 PLUS SENSOR) MISC Change sensor every 15 days.   hydrochlorothiazide  (MICROZIDE ) 12.5 MG capsule Take 1 capsule (12.5 mg total) by mouth daily.   JARDIANCE  25 MG TABS tablet TAKE 1 TABLET BY MOUTH DAILY BEFORE BREAKFAST   lisinopril  (ZESTRIL ) 5 MG tablet Take 1 tablet (5 mg total) by mouth daily.   mometasone  (ELOCON ) 0.1 % cream Apply to aa's ears BID up to 5d/wk PRN.   montelukast  (SINGULAIR ) 10 MG tablet TAKE ONE TABLET EVERY DAY   Multiple Vitamin (MULTIVITAMIN) tablet Take 1 tablet by mouth daily.   ondansetron  (ZOFRAN ) 4 MG tablet Take 1 tablet (4 mg total) by mouth 2 (two) times daily as needed for nausea or vomiting.   OZEMPIC , 1 MG/DOSE, 4 MG/3ML SOPN INJECT 1MG  INTO THE SKIN ONCE WEEKLY AS DIRECTED   rosuvastatin  (CRESTOR ) 10 MG tablet Take 1 tablet (10 mg total) by mouth daily.   sildenafil  (REVATIO ) 20 MG tablet Take 2 tablets two hours before intercouse on an empty stomach.  Do not take with nitrates.   No facility-administered  encounter medications on file as of 05/21/2024.     Lab Results  Component Value Date   WBC 7.7 03/28/2023   HGB 16.8 03/28/2023   HCT 49.4 03/28/2023   PLT 276.0 03/28/2023   GLUCOSE 184 (H) 12/13/2023   CHOL 140 12/13/2023   TRIG 128.0 12/13/2023   HDL 38.50 (L) 12/13/2023   LDLCALC 76 12/13/2023   ALT 26 12/13/2023   AST 19 12/13/2023   NA 136 12/13/2023   K 4.2 12/13/2023   CL 103 12/13/2023  CREATININE 1.08 12/13/2023   BUN 20 12/13/2023   CO2 26 12/13/2023   TSH 0.85 07/20/2023   PSA 0.41 03/28/2023   HGBA1C 6.9 (H) 12/13/2023   MICROALBUR <0.7 12/13/2023    CT CARDIAC SCORING (SELF PAY ONLY) Addendum Date: 01/06/2024 ADDENDUM REPORT: 01/06/2024 10:44 ADDENDUM: OVER-READ INTERPRETATION  CT CHEST The following report is an over-read performed by radiologist Dr. Franky Puffer [facility] on 12/30/2023. This over-read does not include interpretation of cardiac or coronary anatomy or pathology. The interpretation by the cardiologist is attached. COMPARISON:  None. FINDINGS: Within the visualized portions of the lungs demonstrates no acute infiltrates consolidations. No pulmonary nodules or masses. No pleural effusions. No mediastinal masses or adenopathy. No pericardial effusions. Aorta appears normal caliber. No acute findings in the upper abdomen. Chest wall and soft tissues are unremarkable without evidence of acute bony abnormalities. IMPRESSION: Unremarkable CT without evidence of pulmonary nodules or masses. Electronically Signed   By: Franky Chard M.D.   On: 01/06/2024 10:44   Result Date: 01/06/2024 CLINICAL DATA:  Risk stratification EXAM: Coronary Calcium  Score TECHNIQUE: The patient was scanned on a Siemens Somatom scanner. Axial non-contrast 3 mm slices were carried out through the heart. The data set was analyzed on a dedicated work station and scored using the Agatson method. FINDINGS: Non-cardiac: See separate report from [facility]. Ascending  Aorta: Normal size Pericardium: Normal Coronary arteries: Normal origin of left and right coronary arteries. Distribution of arterial calcifications if present, as noted below; LM 0 LAD 120 LCx 0 RCA 0 Total 120 IMPRESSION AND RECOMMENDATION: 1. Coronary calcium  score of 120. This was 91st percentile for age and sex matched control. 2. CAC 100-299 in LAD. CAC-DRS A2/N1. 3. Recommend aspirin  and statin if no contraindication. 4. Continue heart healthy lifestyle and risk factor modification. Electronically Signed: By: Redell Cave M.D. On: 01/02/2024 14:12       Assessment & Plan:  There are no diagnoses linked to this encounter.   Allena Hamilton, MD

## 2024-05-21 NOTE — Telephone Encounter (Signed)
 Copied from CRM [id number]. Topic: Clinical - Medication Refill >> May 21, 2024  3:59 PM Deaijah H wrote: Medication: amphetamine -dextroamphetamine  (ADDERALL) 10 MG tablet  Has the patient contacted their pharmacy? No (Agent: If no, request that the patient contact the pharmacy for the refill. If patient does not wish to contact the pharmacy document the reason why and proceed with request.) (Agent: If yes, when and what did the pharmacy advise?)  This is the patient's preferred pharmacy:  [facility] - [place], [place] - [address] [place] [address] Phone: [phone] Fax: [phone]   Is this the correct pharmacy for this prescription? Yes If no, delete pharmacy and type the correct one.   Has the prescription been filled recently? Yes  Is the patient out of the medication? Yes  Has the patient been seen for an appointment in the last year OR does the patient have an upcoming appointment? Yes  Can we respond through MyChart? Yes  Agent: Please be advised that Rx refills may take up to 3 business days. We ask that you follow-up with your pharmacy.

## 2024-05-22 ENCOUNTER — Other Ambulatory Visit: Payer: Self-pay | Admitting: Internal Medicine

## 2024-05-23 ENCOUNTER — Encounter: Payer: Self-pay | Admitting: Internal Medicine

## 2024-05-23 NOTE — Telephone Encounter (Signed)
 Please call Francisco Jackson. I received a request for adderall refill. He missed his last appt and I have not seen him since March. He needs an appt scheduled before October - asap.

## 2024-05-23 NOTE — Telephone Encounter (Signed)
 Duplicate. See other rx message. Needs earlier appt. Missed last appt. Have not seen since 12/2023.

## 2024-05-24 MED ORDER — AMPHETAMINE-DEXTROAMPHETAMINE 10 MG PO TABS: 10.0000 mg | ORAL_TABLET | Freq: Every day | ORAL | 0 refills | Status: DC

## 2024-05-24 NOTE — Telephone Encounter (Signed)
 See prescription request note that we had discussed.  Please make earlier appt and notify needs to keep appt. I have not seen him since 12/2023. Let me know once scheduled.

## 2024-05-24 NOTE — Telephone Encounter (Signed)
 Rx ok'd for adderall. X 1. Appt made for f/u.

## 2024-05-24 NOTE — Telephone Encounter (Signed)
 See refill request.

## 2024-05-24 NOTE — Telephone Encounter (Signed)
 Moved up appt to 9/11, he needed an early AM appt. Need to have refilled before he goes out of town tomorrow. Sending to PCP and Doc of Day in case Dr Glendia does not see the message.

## 2024-05-24 NOTE — Telephone Encounter (Signed)
 Rx for adderall sent in. My chart message sent to him to notify.

## 2024-06-04 ENCOUNTER — Other Ambulatory Visit: Payer: Self-pay | Admitting: Internal Medicine

## 2024-06-05 NOTE — Telephone Encounter (Signed)
 Rx refilled - crestor  x 1. Needs to keep next appt.

## 2024-06-10 ENCOUNTER — Other Ambulatory Visit: Payer: Self-pay | Admitting: Internal Medicine

## 2024-06-21 ENCOUNTER — Encounter: Payer: Self-pay | Admitting: Internal Medicine

## 2024-06-21 ENCOUNTER — Ambulatory Visit: Admitting: Internal Medicine

## 2024-06-21 VITALS — BP 118/70 | HR 89 | Resp 16 | Ht 71.0 in | Wt 222.0 lb

## 2024-06-21 DIAGNOSIS — M7711 Lateral epicondylitis, right elbow: Secondary | ICD-10-CM

## 2024-06-21 DIAGNOSIS — I10 Essential (primary) hypertension: Secondary | ICD-10-CM | POA: Diagnosis not present

## 2024-06-21 DIAGNOSIS — E1165 Type 2 diabetes mellitus with hyperglycemia: Secondary | ICD-10-CM | POA: Diagnosis not present

## 2024-06-21 DIAGNOSIS — F909 Attention-deficit hyperactivity disorder, unspecified type: Secondary | ICD-10-CM | POA: Diagnosis not present

## 2024-06-21 DIAGNOSIS — Z23 Encounter for immunization: Secondary | ICD-10-CM | POA: Diagnosis not present

## 2024-06-21 DIAGNOSIS — F1029 Alcohol dependence with unspecified alcohol-induced disorder: Secondary | ICD-10-CM | POA: Diagnosis not present

## 2024-06-21 DIAGNOSIS — D582 Other hemoglobinopathies: Secondary | ICD-10-CM

## 2024-06-21 DIAGNOSIS — Z7985 Long-term (current) use of injectable non-insulin antidiabetic drugs: Secondary | ICD-10-CM

## 2024-06-21 DIAGNOSIS — K760 Fatty (change of) liver, not elsewhere classified: Secondary | ICD-10-CM | POA: Diagnosis not present

## 2024-06-21 DIAGNOSIS — Z7984 Long term (current) use of oral hypoglycemic drugs: Secondary | ICD-10-CM | POA: Diagnosis not present

## 2024-06-21 MED ORDER — AMLODIPINE BESYLATE 10 MG PO TABS: 10.0000 mg | ORAL_TABLET | Freq: Every day | ORAL | 1 refills | Status: DC

## 2024-06-21 MED ORDER — AMPHETAMINE-DEXTROAMPHETAMINE 10 MG PO TABS: 10.0000 mg | ORAL_TABLET | Freq: Every day | ORAL | 0 refills | Status: DC

## 2024-06-21 MED ORDER — LANCETS MISC. MISC: 12 refills | Status: AC

## 2024-06-21 MED ORDER — ROSUVASTATIN CALCIUM 10 MG PO TABS: 10.0000 mg | ORAL_TABLET | Freq: Every day | ORAL | 3 refills | Status: DC

## 2024-06-21 MED ORDER — LISINOPRIL 5 MG PO TABS: 5.0000 mg | ORAL_TABLET | Freq: Every day | ORAL | 1 refills | Status: DC

## 2024-06-21 MED ORDER — AMPHETAMINE-DEXTROAMPHETAMINE 5 MG PO TABS: 5.0000 mg | ORAL_TABLET | Freq: Every day | ORAL | 0 refills | Status: DC

## 2024-06-21 MED ORDER — BLOOD GLUCOSE TEST VI STRP: ORAL_STRIP | 12 refills | Status: AC

## 2024-06-21 MED ORDER — LANCET DEVICE MISC: 0 refills | Status: AC

## 2024-06-21 MED ORDER — BLOOD GLUCOSE MONITORING SUPPL DEVI: 0 refills | Status: AC

## 2024-06-21 MED ORDER — EMPAGLIFLOZIN 25 MG PO TABS: 25.0000 mg | ORAL_TABLET | Freq: Every day | ORAL | 1 refills | Status: DC

## 2024-06-21 MED ORDER — HYDROCHLOROTHIAZIDE 12.5 MG PO CAPS: 12.5000 mg | ORAL_CAPSULE | Freq: Every day | ORAL | 1 refills | Status: DC

## 2024-06-21 NOTE — Assessment & Plan Note (Signed)
 On adderall 10mg  in the am. Feels needs something to help get him through the day. Hits a wall in the afternoon. Add 5mg  in the pm to the 10mg  in the am. Follow.

## 2024-06-21 NOTE — Assessment & Plan Note (Signed)
 Continue decrease alcohol intake.

## 2024-06-21 NOTE — Progress Notes (Signed)
 Subjective:    Patient ID: Francisco Jackson, male    DOB: 1976-08-19, 48 y.o.   MRN: [id number]  Patient here for  Chief Complaint  Patient presents with   Medical Management of Chronic Issues    HPI Here for a scheduled follow up - follow up regarding diabetes, hypertension and ADHD. Continues on jardiance  and ozempic . Last A1c 6.9. overdue labs.  Saw ortho - 03/2024 - right lateral epicondylitis - s/p steroid injection. Still with persistent pain.  He is having issues with certain activities.  Discussed follow-up with orthopedics since the conservative measures have not significantly improved his symptoms.  No increased cough or congestion.  No abdominal pain or bowel change.  No chest pain or shortness of breath.  He is taking Adderall.  He does report that he feels only that he gets overall around 1 or 2:00 in the afternoon.  Feels like he needs additional medication to cover for the day.  Tried extended release form.  He did not feel that this helped in the morning.  We discussed adding additional low-dose Adderall in early afternoon.  Discussed monitoring blood pressure.  Overall hand cramps.  Overall states he feels better than he did when he was 28.  Has not been checking his blood sugars.  Unable to get the CGM to work.  Discussed importance of checking blood sugars.  He also reports he has not been taking his medication as directed.   Past Medical History:  Diagnosis Date   Asthma    Atypical mole 04/05/2013   Left forearm. Atypical combined nevus, moderate atypia.   Diabetes mellitus without complication (HCC)    Dysplastic nevus 04/23/2014   Left prox. mid. tricep axilla. Mild atypia, margins free.   Hypertension    Personality disorder (HCC)    borderline   Past Surgical History:  Procedure Laterality Date   ANTERIOR CRUCIATE LIGAMENT REPAIR Right 2006   CERVICAL DISC SURGERY  2010   COLONOSCOPY WITH PROPOFOL  N/A 11/01/2019   Procedure: COLONOSCOPY WITH PROPOFOL ;   Surgeon: Therisa Bi, MD;  Location: [facility] ENDOSCOPY;  Service: Gastroenterology;  Laterality: N/A;   MENISCUS REPAIR Left 1996   Family History  Problem Relation Age of Onset   Diabetes Mother    Melanoma Mother    Diabetes Paternal Aunt    Heart attack Paternal Aunt    Diabetes Paternal Uncle    Diabetes Maternal Grandmother    Diabetes Maternal Grandfather    Heart disease Maternal Grandfather    Heart attack Maternal Grandfather    Heart attack Father    Hypertension Father    Diabetes Sister    Hypertension Sister    Kidney cancer Neg Hx    Kidney disease Neg Hx    Prostate cancer Neg Hx    Social History   Socioeconomic History   Marital status: Married    Spouse name: Vina   Number of children: Not on file   Years of education: Not on file   Highest education level: Associate degree: occupational, Scientist, product/process development, or vocational program  Occupational History   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   Tobacco Use   Smoking status: Former    Current packs/day: 0.00    Average packs/day: 0.5 packs/day for 10.0 years (5.0 ttl pk-yrs)    Types: Cigarettes    Start date: 10/11/1992    Quit date: 10/11/2002    Years since quitting: 21.7   Smokeless tobacco: Former  Types: Snuff    Quit date: 10/11/2009  Vaping Use   Vaping status: Never Used  Substance and Sexual Activity   Alcohol use: Yes    Alcohol/week: 4.0 standard drinks of alcohol    Types: 4 Glasses of wine per week    Comment: ocassional   Drug use: No   Sexual activity: Not on file  Other Topics Concern   Not on file  Social History Narrative   Not on file   Social Drivers of Health   Financial Resource Strain: Low Risk  (03/10/2023)   Overall Financial Resource Strain (CARDIA)    Difficulty of Paying Living Expenses: Not hard at all  Food Insecurity: No Food Insecurity (03/10/2023)   Hunger Vital Sign    Worried About Running Out of Food in the Last Year: Never true    Ran Out of Food  in the Last Year: Never true  Transportation Needs: No Transportation Needs (03/10/2023)   PRAPARE - Administrator, Civil Service (Medical): No    Lack of Transportation (Non-Medical): No  Physical Activity: Insufficiently Active (03/10/2023)   Exercise Vital Sign    Days of Exercise per Week: 3 days    Minutes of Exercise per Session: 30 min  Stress: No Stress Concern Present (03/10/2023)   [employer] of Occupational Health - Occupational Stress Questionnaire    Feeling of Stress : Only a little  Social Connections: Socially Integrated (03/10/2023)   Social Connection and Isolation Panel    Frequency of Communication with Friends and Family: More than three times a week    Frequency of Social Gatherings with Friends and Family: More than three times a week    Attends Religious Services: More than 4 times per year    Active Member of [employer] or Organizations: Yes    Attends Engineer, structural: More than 4 times per year    Marital Status: Married     Review of Systems  Constitutional:  Negative for appetite change and unexpected weight change.  HENT:  Negative for congestion and sinus pressure.   Respiratory:  Negative for cough, chest tightness and shortness of breath.   Cardiovascular:  Negative for chest pain, palpitations and leg swelling.  Gastrointestinal:  Negative for abdominal pain, diarrhea, nausea and vomiting.  Genitourinary:  Negative for difficulty urinating and dysuria.  Musculoskeletal:  Negative for joint swelling and myalgias.  Skin:  Negative for color change and rash.  Neurological:  Negative for dizziness and headaches.  Psychiatric/Behavioral:  Negative for agitation and dysphoric mood.        Objective:     BP 118/70   Pulse 89   Resp 16   Ht 5' 11 (1.803 m)   Wt 222 lb (100.7 kg)   SpO2 98%   BMI 30.96 kg/m  Wt Readings from Last 3 Encounters:  06/21/24 222 lb (100.7 kg)  12/15/23 221 lb 9.6 oz (100.5 kg)  09/14/23  221 lb (100.2 kg)    Physical Exam Vitals reviewed.  Constitutional:      General: He is not in acute distress.    Appearance: Normal appearance. He is well-developed.  HENT:     Head: Normocephalic and atraumatic.     Right Ear: External ear normal.     Left Ear: External ear normal.     Mouth/Throat:     Pharynx: No oropharyngeal exudate or posterior oropharyngeal erythema.  Eyes:     General: No scleral icterus.  Right eye: No discharge.        Left eye: No discharge.     Conjunctiva/sclera: Conjunctivae normal.  Cardiovascular:     Rate and Rhythm: Normal rate and regular rhythm.  Pulmonary:     Effort: Pulmonary effort is normal. No respiratory distress.     Breath sounds: Normal breath sounds.  Abdominal:     General: Bowel sounds are normal.     Palpations: Abdomen is soft.     Tenderness: There is no abdominal tenderness.  Musculoskeletal:        General: No swelling or tenderness.     Cervical back: Neck supple. No tenderness.  Lymphadenopathy:     Cervical: No cervical adenopathy.  Skin:    Findings: No erythema or rash.  Neurological:     Mental Status: He is alert.  Psychiatric:        Mood and Affect: Mood normal.        Behavior: Behavior normal.         Outpatient Encounter Medications as of 06/21/2024  Medication Sig   amphetamine -dextroamphetamine  (ADDERALL) 10 MG tablet Take 1 tablet (10 mg total) by mouth daily with breakfast.   [START ON 07/21/2024] amphetamine -dextroamphetamine  (ADDERALL) 10 MG tablet Take 1 tablet (10 mg total) by mouth daily with breakfast.   [START ON 08/20/2024] amphetamine -dextroamphetamine  (ADDERALL) 10 MG tablet Take 1 tablet (10 mg total) by mouth daily with breakfast.   amphetamine -dextroamphetamine  (ADDERALL) 5 MG tablet Take 1 tablet (5 mg total) by mouth daily. Take in the afternoon.   amLODipine  (NORVASC ) 10 MG tablet Take 1 tablet (10 mg total) by mouth daily.   amphetamine -dextroamphetamine  (ADDERALL) 10 MG  tablet Take 1 tablet (10 mg total) by mouth daily with breakfast.   amphetamine -dextroamphetamine  (ADDERALL) 10 MG tablet Take 1 tablet (10 mg total) by mouth daily with breakfast.   amphetamine -dextroamphetamine  (ADDERALL) 10 MG tablet Take 1 tablet (10 mg total) by mouth daily with breakfast.   busPIRone  (BUSPAR ) 5 MG tablet Take 1 tablet (5 mg total) by mouth 2 (two) times daily as needed.   empagliflozin  (JARDIANCE ) 25 MG TABS tablet Take 1 tablet (25 mg total) by mouth daily before breakfast.   hydrochlorothiazide  (MICROZIDE ) 12.5 MG capsule Take 1 capsule (12.5 mg total) by mouth daily.   lisinopril  (ZESTRIL ) 5 MG tablet Take 1 tablet (5 mg total) by mouth daily.   mometasone  (ELOCON ) 0.1 % cream Apply to aa's ears BID up to 5d/wk PRN.   montelukast  (SINGULAIR ) 10 MG tablet TAKE ONE TABLET EVERY DAY   Multiple Vitamin (MULTIVITAMIN) tablet Take 1 tablet by mouth daily.   ondansetron  (ZOFRAN ) 4 MG tablet Take 1 tablet (4 mg total) by mouth 2 (two) times daily as needed for nausea or vomiting.   rosuvastatin  (CRESTOR ) 10 MG tablet Take 1 tablet (10 mg total) by mouth daily.   Semaglutide , 1 MG/DOSE, (OZEMPIC , 1 MG/DOSE,) 4 MG/3ML SOPN INJECT 1MG  INTO THE SKIN ONCE WEEKLY AS DIRECTED   sildenafil  (REVATIO ) 20 MG tablet Take 2 tablets two hours before intercouse on an empty stomach.  Do not take with nitrates.   [DISCONTINUED] amLODipine  (NORVASC ) 10 MG tablet Take 1 tablet (10 mg total) by mouth daily.   [DISCONTINUED] Continuous Glucose Sensor (FREESTYLE LIBRE 3 PLUS SENSOR) MISC Change sensor every 15 days.   [DISCONTINUED] hydrochlorothiazide  (MICROZIDE ) 12.5 MG capsule Take 1 capsule (12.5 mg total) by mouth daily.   [DISCONTINUED] JARDIANCE  25 MG TABS tablet TAKE 1 TABLET BY MOUTH DAILY BEFORE BREAKFAST   [  DISCONTINUED] lisinopril  (ZESTRIL ) 5 MG tablet Take 1 tablet (5 mg total) by mouth daily.   [DISCONTINUED] rosuvastatin  (CRESTOR ) 10 MG tablet TAKE ONE TABLET BY MOUTH ONCE DAILY   No  facility-administered encounter medications on file as of 06/21/2024.     Lab Results  Component Value Date   WBC 7.7 03/28/2023   HGB 16.8 03/28/2023   HCT 49.4 03/28/2023   PLT 276.0 03/28/2023   GLUCOSE 184 (H) 12/13/2023   CHOL 140 12/13/2023   TRIG 128.0 12/13/2023   HDL 38.50 (L) 12/13/2023   LDLCALC 76 12/13/2023   ALT 26 12/13/2023   AST 19 12/13/2023   NA 136 12/13/2023   K 4.2 12/13/2023   CL 103 12/13/2023   CREATININE 1.08 12/13/2023   BUN 20 12/13/2023   CO2 26 12/13/2023   TSH 0.85 07/20/2023   PSA 0.41 03/28/2023   HGBA1C 6.9 (H) 12/13/2023   MICROALBUR <0.7 12/13/2023    CT CARDIAC SCORING (SELF PAY ONLY) Addendum Date: 01/06/2024 ADDENDUM REPORT: 01/06/2024 10:44 ADDENDUM: OVER-READ INTERPRETATION  CT CHEST The following report is an over-read performed by radiologist Dr. Franky Puffer [facility] on 12/30/2023. This over-read does not include interpretation of cardiac or coronary anatomy or pathology. The interpretation by the cardiologist is attached. COMPARISON:  None. FINDINGS: Within the visualized portions of the lungs demonstrates no acute infiltrates consolidations. No pulmonary nodules or masses. No pleural effusions. No mediastinal masses or adenopathy. No pericardial effusions. Aorta appears normal caliber. No acute findings in the upper abdomen. Chest wall and soft tissues are unremarkable without evidence of acute bony abnormalities. IMPRESSION: Unremarkable CT without evidence of pulmonary nodules or masses. Electronically Signed   By: Franky Chard M.D.   On: 01/06/2024 10:44   Result Date: 01/06/2024 CLINICAL DATA:  Risk stratification EXAM: Coronary Calcium  Score TECHNIQUE: The patient was scanned on a Siemens Somatom scanner. Axial non-contrast 3 mm slices were carried out through the heart. The data set was analyzed on a dedicated work station and scored using the Agatson method. FINDINGS: Non-cardiac: See separate report from [facility]. Ascending Aorta: Normal size Pericardium: Normal Coronary arteries: Normal origin of left and right coronary arteries. Distribution of arterial calcifications if present, as noted below; LM 0 LAD 120 LCx 0 RCA 0 Total 120 IMPRESSION AND RECOMMENDATION: 1. Coronary calcium  score of 120. This was 91st percentile for age and sex matched control. 2. CAC 100-299 in LAD. CAC-DRS A2/N1. 3. Recommend aspirin  and statin if no contraindication. 4. Continue heart healthy lifestyle and risk factor modification. Electronically Signed: By: Redell Cave M.D. On: 01/02/2024 14:12       Assessment & Plan:  Hypertension, essential Assessment & Plan: Continue hydrochlorothiazide  and amlodipine . No change today in medication. Follow pressures.  Follow metabolic panel. Discussed the need to spot check pressure with adderall and dose change. Follow.    Hepatic steatosis Assessment & Plan: Found on abdominal ultrasound. Continue diet, exercise and weight loss. Check liver panel with next fasting labs.    Alcohol dependence with unspecified alcohol-induced disorder [facility]) Assessment & Plan: Continue decrease alcohol intake.    Epicondylitis, lateral, right Assessment & Plan: Continue to f/u with ortho as outlined.    Elevated hemoglobin (HCC) Assessment & Plan: Follow cbc.    Type 2 diabetes mellitus with hyperglycemia, without long-term current use of insulin (HCC) Assessment & Plan: Continues on ozempic . Off metformin . Continues on jardiance .  Continue diet and exercise. Discussed the need to check sugars. Will send in  glucometer. Follow met b and A1c.   Lab Results  Component Value Date   HGBA1C 6.9 (H) 12/13/2023      Attention deficit hyperactivity disorder (ADHD), unspecified ADHD type Assessment & Plan: On adderall 10mg  in the am. Feels needs something to help get him through the day. Hits a wall in the afternoon. Add 5mg  in the pm to the 10mg  in the am. Follow.    Other  orders -     amLODIPine  Besylate; Take 1 tablet (10 mg total) by mouth daily.  Dispense: 90 tablet; Refill: 1 -     hydroCHLOROthiazide ; Take 1 capsule (12.5 mg total) by mouth daily.  Dispense: 90 capsule; Refill: 1 -     Empagliflozin ; Take 1 tablet (25 mg total) by mouth daily before breakfast.  Dispense: 90 tablet; Refill: 1 -     Lisinopril ; Take 1 tablet (5 mg total) by mouth daily.  Dispense: 90 tablet; Refill: 1 -     Rosuvastatin  Calcium ; Take 1 tablet (10 mg total) by mouth daily.  Dispense: 90 tablet; Refill: 3 -     Amphetamine -Dextroamphetamine ; Take 1 tablet (10 mg total) by mouth daily with breakfast.  Dispense: 30 tablet; Refill: 0 -     Amphetamine -Dextroamphetamine ; Take 1 tablet (10 mg total) by mouth daily with breakfast.  Dispense: 30 tablet; Refill: 0 -     Amphetamine -Dextroamphetamine ; Take 1 tablet (10 mg total) by mouth daily with breakfast.  Dispense: 30 tablet; Refill: 0 -     Amphetamine -Dextroamphetamine ; Take 1 tablet (5 mg total) by mouth daily. Take in the afternoon.  Dispense: 30 tablet; Refill: 0     Allena Hamilton, MD

## 2024-06-21 NOTE — Assessment & Plan Note (Signed)
 Continue hydrochlorothiazide  and amlodipine . No change today in medication. Follow pressures.  Follow metabolic panel. Discussed the need to spot check pressure with adderall and dose change. Follow.

## 2024-06-21 NOTE — Addendum Note (Signed)
 Addended by: LEARTA PORTO D on: 06/21/2024 10:57 AM   Modules accepted: Orders

## 2024-06-21 NOTE — Assessment & Plan Note (Signed)
 Follow cbc.

## 2024-06-21 NOTE — Assessment & Plan Note (Signed)
 Continues on ozempic . Off metformin . Continues on jardiance .  Continue diet and exercise. Discussed the need to check sugars. Will send in glucometer. Follow met b and A1c.   Lab Results  Component Value Date   HGBA1C 6.9 (H) 12/13/2023

## 2024-06-21 NOTE — Addendum Note (Signed)
 Addended by: LEARTA PORTO D on: 06/21/2024 10:54 AM   Modules accepted: Orders

## 2024-06-21 NOTE — Assessment & Plan Note (Signed)
 Found on abdominal ultrasound. Continue diet, exercise and weight loss. Check liver panel with next fasting labs.

## 2024-06-21 NOTE — Assessment & Plan Note (Signed)
 Continue to f/u with ortho as outlined.

## 2024-07-05 ENCOUNTER — Ambulatory Visit: Payer: Self-pay | Admitting: Internal Medicine

## 2024-07-05 ENCOUNTER — Other Ambulatory Visit (INDEPENDENT_AMBULATORY_CARE_PROVIDER_SITE_OTHER)

## 2024-07-05 DIAGNOSIS — Z125 Encounter for screening for malignant neoplasm of prostate: Secondary | ICD-10-CM | POA: Diagnosis not present

## 2024-07-05 DIAGNOSIS — I10 Essential (primary) hypertension: Secondary | ICD-10-CM

## 2024-07-05 DIAGNOSIS — E782 Mixed hyperlipidemia: Secondary | ICD-10-CM

## 2024-07-05 DIAGNOSIS — E1165 Type 2 diabetes mellitus with hyperglycemia: Secondary | ICD-10-CM

## 2024-07-05 LAB — CBC WITH DIFFERENTIAL/PLATELET
Basophils Absolute: 0.1 K/uL (ref 0.0–0.1)
Basophils Relative: 0.9 % (ref 0.0–3.0)
Eosinophils Absolute: 0.4 K/uL (ref 0.0–0.7)
Eosinophils Relative: 6.5 % — ABNORMAL HIGH (ref 0.0–5.0)
HCT: 51.1 % (ref 39.0–52.0)
Hemoglobin: 17.5 g/dL — ABNORMAL HIGH (ref 13.0–17.0)
Lymphocytes Relative: 29.6 % (ref 12.0–46.0)
Lymphs Abs: 1.9 K/uL (ref 0.7–4.0)
MCHC: 34.3 g/dL (ref 30.0–36.0)
MCV: 86.5 fl (ref 78.0–100.0)
Monocytes Absolute: 0.6 K/uL (ref 0.1–1.0)
Monocytes Relative: 8.9 % (ref 3.0–12.0)
Neutro Abs: 3.5 K/uL (ref 1.4–7.7)
Neutrophils Relative %: 54.1 % (ref 43.0–77.0)
Platelets: 306 K/uL (ref 150.0–400.0)
RBC: 5.91 Mil/uL — ABNORMAL HIGH (ref 4.22–5.81)
RDW: 12.6 % (ref 11.5–15.5)
WBC: 6.5 K/uL (ref 4.0–10.5)

## 2024-07-05 LAB — BASIC METABOLIC PANEL WITH GFR
BUN: 10 mg/dL (ref 6–23)
CO2: 28 meq/L (ref 19–32)
Calcium: 9.7 mg/dL (ref 8.4–10.5)
Chloride: 102 meq/L (ref 96–112)
Creatinine, Ser: 1.11 mg/dL (ref 0.40–1.50)
GFR: 77.69 mL/min (ref >60.00)
Glucose, Bld: 159 mg/dL — ABNORMAL HIGH (ref 70–99)
Potassium: 3.9 meq/L (ref 3.5–5.1)
Sodium: 139 meq/L (ref 135–145)

## 2024-07-05 LAB — LIPID PANEL
Cholesterol: 111 mg/dL (ref 0–200)
HDL: 35.5 mg/dL — ABNORMAL LOW (ref >39.00)
LDL Cholesterol: 35 mg/dL (ref 0–99)
NonHDL: 75.98
Total CHOL/HDL Ratio: 3
Triglycerides: 203 mg/dL — ABNORMAL HIGH (ref 0.0–149.0)
VLDL: 40.6 mg/dL — ABNORMAL HIGH (ref 0.0–40.0)

## 2024-07-05 LAB — PSA: PSA: 0.4 ng/mL (ref 0.10–4.00)

## 2024-07-05 LAB — HEPATIC FUNCTION PANEL
ALT: 25 U/L (ref 0–53)
AST: 19 U/L (ref 0–37)
Albumin: 4.6 g/dL (ref 3.5–5.2)
Alkaline Phosphatase: 72 U/L (ref 39–117)
Bilirubin, Direct: 0.1 mg/dL (ref 0.0–0.3)
Total Bilirubin: 0.6 mg/dL (ref 0.2–1.2)
Total Protein: 7.2 g/dL (ref 6.0–8.3)

## 2024-07-05 LAB — HEMOGLOBIN A1C: Hgb A1c MFr Bld: 7.5 % — ABNORMAL HIGH (ref 4.6–6.5)

## 2024-07-17 ENCOUNTER — Ambulatory Visit: Admitting: Internal Medicine

## 2024-07-17 ENCOUNTER — Telehealth (INDEPENDENT_AMBULATORY_CARE_PROVIDER_SITE_OTHER): Admitting: Internal Medicine

## 2024-07-17 VITALS — Ht 71.0 in | Wt 222.0 lb

## 2024-07-17 DIAGNOSIS — D582 Other hemoglobinopathies: Secondary | ICD-10-CM

## 2024-07-17 DIAGNOSIS — E782 Mixed hyperlipidemia: Secondary | ICD-10-CM | POA: Diagnosis not present

## 2024-07-17 DIAGNOSIS — G4730 Sleep apnea, unspecified: Secondary | ICD-10-CM | POA: Diagnosis not present

## 2024-07-17 DIAGNOSIS — M7711 Lateral epicondylitis, right elbow: Secondary | ICD-10-CM | POA: Diagnosis not present

## 2024-07-17 DIAGNOSIS — J4530 Mild persistent asthma, uncomplicated: Secondary | ICD-10-CM | POA: Diagnosis not present

## 2024-07-17 DIAGNOSIS — E1165 Type 2 diabetes mellitus with hyperglycemia: Secondary | ICD-10-CM | POA: Diagnosis not present

## 2024-07-17 DIAGNOSIS — F1029 Alcohol dependence with unspecified alcohol-induced disorder: Secondary | ICD-10-CM

## 2024-07-17 DIAGNOSIS — K760 Fatty (change of) liver, not elsewhere classified: Secondary | ICD-10-CM | POA: Diagnosis not present

## 2024-07-17 DIAGNOSIS — F439 Reaction to severe stress, unspecified: Secondary | ICD-10-CM

## 2024-07-17 DIAGNOSIS — F909 Attention-deficit hyperactivity disorder, unspecified type: Secondary | ICD-10-CM | POA: Diagnosis not present

## 2024-07-17 DIAGNOSIS — I10 Essential (primary) hypertension: Secondary | ICD-10-CM

## 2024-07-17 NOTE — Progress Notes (Addendum)
 Patient ID: Francisco Jackson, male   DOB: 03-28-1976, 48 y.o.   MRN: [id number]   Virtual Visit via virtual Note  I connected with Francisco Jackson by a video enabled telemedicine application and verified that I am speaking with the correct person using two identifiers. Location patient: home Location provider: work  Persons participating in the virtual visit: patient, provider  The limitations, risks, security and privacy concerns of performing an evaluation and management service by video and the availability of in person appointments have been discussed. It has also been discussed with the patient that there may be a patient responsible charge related to this service. The patient expressed understanding and agreed to proceed.   Reason for visit: follow up appt  HPI: Follow up appt -  follow up regarding diabetes, hypertension and ADHD. Continues on jardiance  and ozempic . Recent A1c elevated = 7.5. discussed low carb diet and exercise. Discussed medication adjustment. He plans to start watching diet more. Saw ortho - 03/2024 - right lateral epicondylitis - s/p steroid injection. Still with persistent pain.  He is having issues with certain activities. Last visit, adderall 5mg  added in the pm. Instructed to continue on 10mg  in the am. Feels this is working well. Tolerating. No chest pain or sob reported. No abdominal pain or bowel change reported.    ROS: See pertinent positives and negatives per HPI.  Past Medical History:  Diagnosis Date   Asthma    Atypical mole 04/05/2013   Left forearm. Atypical combined nevus, moderate atypia.   Diabetes mellitus without complication (HCC)    Dysplastic nevus 04/23/2014   Left prox. mid. tricep axilla. Mild atypia, margins free.   Hypertension    Personality disorder (HCC)    borderline    Past Surgical History:  Procedure Laterality Date   ANTERIOR CRUCIATE LIGAMENT REPAIR Right 2006   CERVICAL DISC SURGERY  2010   COLONOSCOPY WITH PROPOFOL   N/A 11/01/2019   Procedure: COLONOSCOPY WITH PROPOFOL ;  Surgeon: Therisa Bi, MD;  Location: [facility] ENDOSCOPY;  Service: Gastroenterology;  Laterality: N/A;   MENISCUS REPAIR Left 1996    Family History  Problem Relation Age of Onset   Diabetes Mother    Melanoma Mother    Diabetes Paternal Aunt    Heart attack Paternal Aunt    Diabetes Paternal Uncle    Diabetes Maternal Grandmother    Diabetes Maternal Grandfather    Heart disease Maternal Grandfather    Heart attack Maternal Grandfather    Heart attack Father    Hypertension Father    Diabetes Sister    Hypertension Sister    Kidney cancer Neg Hx    Kidney disease Neg Hx    Prostate cancer Neg Hx     SOCIAL HX: reviewed.    Current Outpatient Medications:    amphetamine -dextroamphetamine  (ADDERALL) 5 MG tablet, Take 1 tablet (5 mg total) by mouth daily. Takes in the afternoon, Disp: 30 tablet, Rfl: 0   [START ON 08/16/2024] amphetamine -dextroamphetamine  (ADDERALL) 5 MG tablet, Take 1 tablet (5 mg total) by mouth daily. Takes in the afternoon, Disp: 30 tablet, Rfl: 0   [START ON 09/15/2024] amphetamine -dextroamphetamine  (ADDERALL) 5 MG tablet, Take 1 tablet (5 mg total) by mouth daily. Takes in the afternoon., Disp: 30 tablet, Rfl: 0   amLODipine  (NORVASC ) 10 MG tablet, Take 1 tablet (10 mg total) by mouth daily., Disp: 90 tablet, Rfl: 1   amphetamine -dextroamphetamine  (ADDERALL) 10 MG tablet, Take 1 tablet (10 mg total) by mouth daily with breakfast., Disp:  30 tablet, Rfl: 0   amphetamine -dextroamphetamine  (ADDERALL) 10 MG tablet, Take 1 tablet (10 mg total) by mouth daily with breakfast., Disp: 30 tablet, Rfl: 0   [START ON 08/20/2024] amphetamine -dextroamphetamine  (ADDERALL) 10 MG tablet, Take 1 tablet (10 mg total) by mouth daily with breakfast., Disp: 30 tablet, Rfl: 0   amphetamine -dextroamphetamine  (ADDERALL) 5 MG tablet, Take 1 tablet (5 mg total) by mouth daily. Take in the afternoon., Disp: 30 tablet, Rfl: 0   Blood  Glucose Monitoring Suppl DEVI, USE TO CHECK BLOOD SUGARS ONCE DAILY. DX E11.9, Disp: 1 each, Rfl: 0   busPIRone  (BUSPAR ) 5 MG tablet, Take 1 tablet (5 mg total) by mouth 2 (two) times daily as needed., Disp: 180 tablet, Rfl: 1   empagliflozin  (JARDIANCE ) 25 MG TABS tablet, Take 1 tablet (25 mg total) by mouth daily before breakfast., Disp: 90 tablet, Rfl: 1   Glucose Blood (BLOOD GLUCOSE TEST STRIPS) STRP, USE TO CHECK BLOOD SUGARS ONCE DAILY. DX E11.9, Disp: 100 strip, Rfl: 12   hydrochlorothiazide  (MICROZIDE ) 12.5 MG capsule, Take 1 capsule (12.5 mg total) by mouth daily., Disp: 90 capsule, Rfl: 1   Lancet Device MISC, USE TO CHECK BLOOD SUGARS ONCE DAILY. DX E11.9, Disp: 1 each, Rfl: 0   Lancets Misc. MISC, USE TO CHECK BLOOD SUGARS ONCE DAILY. DX E11.9, Disp: 100 each, Rfl: 12   lisinopril  (ZESTRIL ) 5 MG tablet, Take 1 tablet (5 mg total) by mouth daily., Disp: 90 tablet, Rfl: 1   mometasone  (ELOCON ) 0.1 % cream, Apply to aa's ears BID up to 5d/wk PRN., Disp: 15 g, Rfl: 1   montelukast  (SINGULAIR ) 10 MG tablet, TAKE ONE TABLET EVERY DAY, Disp: 90 tablet, Rfl: 3   Multiple Vitamin (MULTIVITAMIN) tablet, Take 1 tablet by mouth daily., Disp: , Rfl:    ondansetron  (ZOFRAN ) 4 MG tablet, Take 1 tablet (4 mg total) by mouth 2 (two) times daily as needed for nausea or vomiting., Disp: 14 tablet, Rfl: 0   rosuvastatin  (CRESTOR ) 10 MG tablet, Take 1 tablet (10 mg total) by mouth daily., Disp: 90 tablet, Rfl: 3   Semaglutide , 1 MG/DOSE, (OZEMPIC , 1 MG/DOSE,) 4 MG/3ML SOPN, INJECT 1MG  INTO THE SKIN ONCE WEEKLY AS DIRECTED, Disp: 3 mL, Rfl: 1   sildenafil  (REVATIO ) 20 MG tablet, Take 2 tablets two hours before intercouse on an empty stomach.  Do not take with nitrates., Disp: 30 tablet, Rfl: 1  EXAM:  GENERAL: alert, oriented, appears well and in no acute distress  HEENT: atraumatic, conjunttiva clear, no obvious abnormalities on inspection of external nose and ears  NECK: normal movements of the head  and neck  LUNGS: on inspection no signs of respiratory distress, breathing rate appears normal, no obvious gross SOB, gasping or wheezing  CV: no obvious cyanosis  PSYCH/NEURO: pleasant and cooperative, no obvious depression or anxiety, speech and thought processing grossly intact  ASSESSMENT AND PLAN:  Discussed the following assessment and plan:  Problem List Items Addressed This Visit     Stress   Overall appears to be handling things relatively well. Has buspar . Follow. No changes today.       Sleep apnea   Discussed today with elevated hgb. Will need f/u.       Mixed hyperlipidemia   Continue crestor . Low cholesterol diet and exercise. Follow lipid panel and liver function tests.   Lab Results  Component Value Date   CHOL 111 07/05/2024   HDL 35.50 (L) 07/05/2024   LDLCALC 35 07/05/2024   TRIG 203.0 (  H) 07/05/2024   CHOLHDL 3 07/05/2024         Hypertension, essential   Continue hydrochlorothiazide  and amlodipine . No change today in medication. Follow pressures.  Follow metabolic panel.        Hepatic steatosis   Found on abdominal ultrasound. Continue diet, exercise and weight loss. Follow liver panel. 07/05/24 - liver panel wnl.       EtOH dependence (HCC)   Has decreased alcohol intake. Follow.       Epicondylitis, lateral, right   Has seen emerge. S/p previous injection. Some persistent issues. Consider f/u with ortho.       Elevated hemoglobin   Recent hgb elevated 17.5. discussed stopping smoking cigars. Only smokes occasionally. Discussed possible sleep apnea.  Discussed staying hydrated. Recheck cbc soon to confirm improved.       Relevant Orders   CBC with Differential/Platelet   Diabetes mellitus (HCC) - Primary   Continues on ozempic  and jaridance. Discussed recent labs - A1c increased - 7.5. wants to hold on changing medication. Low carb diet and exercise. Follow. If persistent elevation, will require adjustment in medication. Consider increase  ozempic .       Asthma, mild persistent   Has seen Dr Frutoso.  Prescribed singulair , symbicort  and astelin.  Breathing stable. No sob. Follow.        ADHD   Doing well on current adderall regimen. Will continue. No changes today. Follow.        Return in about 3 months (around 10/17/2024) for physical. schedule cbc only in 3 weeks. .   I discussed the assessment and treatment plan with the patient. The patient was provided an opportunity to ask questions and all were answered. The patient agreed with the plan and demonstrated an understanding of the instructions.   The patient was advised to call back or seek an in-person evaluation if the symptoms worsen or if the condition fails to improve as anticipated.    Allena Hamilton, MD

## 2024-07-22 ENCOUNTER — Encounter: Payer: Self-pay | Admitting: Internal Medicine

## 2024-07-22 MED ORDER — AMPHETAMINE-DEXTROAMPHETAMINE 5 MG PO TABS: 5.0000 mg | ORAL_TABLET | Freq: Every day | ORAL | 0 refills | Status: DC

## 2024-07-22 NOTE — Assessment & Plan Note (Signed)
 Doing well on current adderall regimen. Will continue. No changes today. Follow.

## 2024-07-22 NOTE — Assessment & Plan Note (Signed)
 Continues on ozempic  and jaridance. Discussed recent labs - A1c increased - 7.5. wants to hold on changing medication. Low carb diet and exercise. Follow. If persistent elevation, will require adjustment in medication. Consider increase ozempic .

## 2024-07-22 NOTE — Addendum Note (Signed)
 Addended by: GLENDIA ALLENA RAMAN on: 07/22/2024 04:55 PM   Modules accepted: Orders

## 2024-07-22 NOTE — Assessment & Plan Note (Signed)
 Discussed today with elevated hgb. Will need f/u.

## 2024-07-22 NOTE — Assessment & Plan Note (Signed)
 Overall appears to be handling things relatively well. Has buspar . Follow. No changes today.

## 2024-07-22 NOTE — Assessment & Plan Note (Signed)
 Recent hgb elevated 17.5. discussed stopping smoking cigars. Only smokes occasionally. Discussed possible sleep apnea.  Discussed staying hydrated. Recheck cbc soon to confirm improved.

## 2024-07-22 NOTE — Assessment & Plan Note (Signed)
 Has seen Dr Frutoso.  Prescribed singulair , symbicort  and astelin.  Breathing stable. No sob. Follow.

## 2024-07-22 NOTE — Assessment & Plan Note (Signed)
 Continue hydrochlorothiazide and amlodipine. No change today in medication. Follow pressures.  Follow metabolic panel.

## 2024-07-22 NOTE — Assessment & Plan Note (Signed)
 Found on abdominal ultrasound. Continue diet, exercise and weight loss. Follow liver panel. 07/05/24 - liver panel wnl.

## 2024-07-22 NOTE — Assessment & Plan Note (Signed)
 Has seen emerge. S/p previous injection. Some persistent issues. Consider f/u with ortho.

## 2024-07-22 NOTE — Assessment & Plan Note (Signed)
Has decreased alcohol intake.  Follow.  

## 2024-07-22 NOTE — Assessment & Plan Note (Signed)
 Continue crestor . Low cholesterol diet and exercise. Follow lipid panel and liver function tests.   Lab Results  Component Value Date   CHOL 111 07/05/2024   HDL 35.50 (L) 07/05/2024   LDLCALC 35 07/05/2024   TRIG 203.0 (H) 07/05/2024   CHOLHDL 3 07/05/2024

## 2024-08-27 ENCOUNTER — Other Ambulatory Visit (INDEPENDENT_AMBULATORY_CARE_PROVIDER_SITE_OTHER)

## 2024-08-27 DIAGNOSIS — D582 Other hemoglobinopathies: Secondary | ICD-10-CM

## 2024-08-27 LAB — CBC WITH DIFFERENTIAL/PLATELET
Basophils Absolute: 0.1 K/uL (ref 0.0–0.1)
Basophils Relative: 1.1 % (ref 0.0–3.0)
Eosinophils Absolute: 0.4 K/uL (ref 0.0–0.7)
Eosinophils Relative: 4.9 % (ref 0.0–5.0)
HCT: 50 % (ref 39.0–52.0)
Hemoglobin: 17 g/dL (ref 13.0–17.0)
Lymphocytes Relative: 31.7 % (ref 12.0–46.0)
Lymphs Abs: 2.3 K/uL (ref 0.7–4.0)
MCHC: 33.9 g/dL (ref 30.0–36.0)
MCV: 87.1 fl (ref 78.0–100.0)
Monocytes Absolute: 0.6 K/uL (ref 0.1–1.0)
Monocytes Relative: 8.4 % (ref 3.0–12.0)
Neutro Abs: 3.8 K/uL (ref 1.4–7.7)
Neutrophils Relative %: 53.9 % (ref 43.0–77.0)
Platelets: 286 K/uL (ref 150.0–400.0)
RBC: 5.74 Mil/uL (ref 4.22–5.81)
RDW: 13.1 % (ref 11.5–15.5)
WBC: 7.1 K/uL (ref 4.0–10.5)

## 2024-08-29 ENCOUNTER — Ambulatory Visit: Payer: Self-pay | Admitting: Internal Medicine

## 2024-08-29 NOTE — Telephone Encounter (Signed)
 open in error

## 2024-09-24 ENCOUNTER — Other Ambulatory Visit: Payer: Self-pay | Admitting: Internal Medicine

## 2024-09-25 ENCOUNTER — Encounter: Payer: Self-pay | Admitting: Internal Medicine

## 2024-09-25 ENCOUNTER — Ambulatory Visit (INDEPENDENT_AMBULATORY_CARE_PROVIDER_SITE_OTHER): Admitting: Internal Medicine

## 2024-09-25 VITALS — BP 118/80 | HR 90 | Temp 97.9°F | Ht 71.0 in | Wt 217.6 lb

## 2024-09-25 DIAGNOSIS — M79601 Pain in right arm: Secondary | ICD-10-CM | POA: Insufficient documentation

## 2024-09-25 DIAGNOSIS — I10 Essential (primary) hypertension: Secondary | ICD-10-CM

## 2024-09-25 DIAGNOSIS — E782 Mixed hyperlipidemia: Secondary | ICD-10-CM

## 2024-09-25 DIAGNOSIS — E1165 Type 2 diabetes mellitus with hyperglycemia: Secondary | ICD-10-CM | POA: Diagnosis not present

## 2024-09-25 DIAGNOSIS — K760 Fatty (change of) liver, not elsewhere classified: Secondary | ICD-10-CM | POA: Diagnosis not present

## 2024-09-25 DIAGNOSIS — F909 Attention-deficit hyperactivity disorder, unspecified type: Secondary | ICD-10-CM | POA: Diagnosis not present

## 2024-09-25 DIAGNOSIS — Z0000 Encounter for general adult medical examination without abnormal findings: Secondary | ICD-10-CM | POA: Diagnosis not present

## 2024-09-25 DIAGNOSIS — Z7985 Long-term (current) use of injectable non-insulin antidiabetic drugs: Secondary | ICD-10-CM

## 2024-09-25 DIAGNOSIS — F1029 Alcohol dependence with unspecified alcohol-induced disorder: Secondary | ICD-10-CM

## 2024-09-25 DIAGNOSIS — F439 Reaction to severe stress, unspecified: Secondary | ICD-10-CM

## 2024-09-25 LAB — HM DIABETES FOOT EXAM

## 2024-09-25 MED ORDER — EMPAGLIFLOZIN 25 MG PO TABS: 25.0000 mg | ORAL_TABLET | Freq: Every day | ORAL | 1 refills | Status: DC

## 2024-09-25 MED ORDER — HYDROCHLOROTHIAZIDE 12.5 MG PO CAPS: 12.5000 mg | ORAL_CAPSULE | Freq: Every day | ORAL | 1 refills | Status: DC

## 2024-09-25 MED ORDER — AMLODIPINE BESYLATE 10 MG PO TABS: 10.0000 mg | ORAL_TABLET | Freq: Every day | ORAL | 1 refills | Status: DC

## 2024-09-25 MED ORDER — OZEMPIC (1 MG/DOSE) 4 MG/3ML ~~LOC~~ SOPN: 1.0000 mg | PEN_INJECTOR | SUBCUTANEOUS | 1 refills | Status: AC

## 2024-09-25 MED ORDER — LISINOPRIL 5 MG PO TABS: 5.0000 mg | ORAL_TABLET | Freq: Every day | ORAL | 1 refills | Status: DC

## 2024-09-25 MED ORDER — BUSPIRONE HCL 5 MG PO TABS: 5.0000 mg | ORAL_TABLET | Freq: Two times a day (BID) | ORAL | 1 refills | Status: DC | PRN

## 2024-09-25 NOTE — Assessment & Plan Note (Signed)
 Physical today 09/25/24.  Colonoscopy 10/2019 - descending colon polyp (tubular adenoma) - Dr Therisa - recommended f/u in 7 years.  PSA 07/05/24 - .4.

## 2024-09-25 NOTE — Progress Notes (Unsigned)
 Subjective:    Patient ID: Francisco Jackson, male    DOB: 10/05/1976, 48 y.o.   MRN: [id number]  Patient here for  Chief Complaint  Patient presents with   Annual Exam    HPI Here for a physical exam. Continues on jardiance  and ozempic . Continues on adderall - 10mg  in am and 5mg  in pm. Doing well on this medication. Main complaint is right elbow/arm pain. Persistent pain. Saw ortho previously. S/p MRI. Has been doing exercise. Took antiinflammatory medication. Persistent issue.    Past Medical History:  Diagnosis Date   Asthma    Atypical mole 04/05/2013   Left forearm. Atypical combined nevus, moderate atypia.   Diabetes mellitus without complication (HCC)    Dysplastic nevus 04/23/2014   Left prox. mid. tricep axilla. Mild atypia, margins free.   Hypertension    Personality disorder (HCC)    borderline   Past Surgical History:  Procedure Laterality Date   ANTERIOR CRUCIATE LIGAMENT REPAIR Right 2006   CERVICAL DISC SURGERY  2010   COLONOSCOPY WITH PROPOFOL  N/A 11/01/2019   Procedure: COLONOSCOPY WITH PROPOFOL ;  Surgeon: Therisa Bi, MD;  Location: [facility] ENDOSCOPY;  Service: Gastroenterology;  Laterality: N/A;   MENISCUS REPAIR Left 1996   Family History  Problem Relation Age of Onset   Diabetes Mother    Melanoma Mother    Diabetes Paternal Aunt    Heart attack Paternal Aunt    Diabetes Paternal Uncle    Diabetes Maternal Grandmother    Diabetes Maternal Grandfather    Heart disease Maternal Grandfather    Heart attack Maternal Grandfather    Heart attack Father    Hypertension Father    Diabetes Sister    Hypertension Sister    Kidney cancer Neg Hx    Kidney disease Neg Hx    Prostate cancer Neg Hx    Social History   Socioeconomic History   Marital status: Married    Spouse name: Vina   Number of children: Not on file   Years of education: Not on file   Highest education level: Some college, no degree  Occupational History   Occupation: nutritional therapist     Comment: owner of plumbing company   Tobacco Use   Smoking status: Former    Current packs/day: 0.00    Average packs/day: 0.5 packs/day for 10.0 years (5.0 ttl pk-yrs)    Types: Cigarettes    Start date: 10/11/1992    Quit date: 10/11/2002    Years since quitting: 21.9   Smokeless tobacco: Former    Types: Snuff    Quit date: 10/11/2009  Vaping Use   Vaping status: Never Used  Substance and Sexual Activity   Alcohol use: Yes    Alcohol/week: 4.0 standard drinks of alcohol    Types: 2 Glasses of wine, 2 Cans of beer per week    Comment: ocassional   Drug use: No   Sexual activity: Yes  Other Topics Concern   Not on file  Social History Narrative   Not on file   Social Drivers of Health   Tobacco Use: Medium Risk (09/25/2024)   Patient History    Smoking Tobacco Use: Former    Smokeless Tobacco Use: Former    Passive Exposure: Not on Actuary Strain: Low Risk (09/24/2024)   Overall Financial Resource Strain (CARDIA)    Difficulty of Paying Living Expenses: Not hard at all  Food Insecurity: No Food Insecurity (09/24/2024)   Epic    Worried  About Running Out of Food in the Last Year: Never true    Ran Out of Food in the Last Year: Never true  Transportation Needs: No Transportation Needs (09/24/2024)   Epic    Lack of Transportation (Medical): No    Lack of Transportation (Non-Medical): No  Physical Activity: Insufficiently Active (09/24/2024)   Exercise Vital Sign    Days of Exercise per Week: 3 days    Minutes of Exercise per Session: 10 min  Stress: No Stress Concern Present (09/24/2024)   [employer] of Occupational Health - Occupational Stress Questionnaire    Feeling of Stress: Only a little  Social Connections: Socially Integrated (09/24/2024)   Social Connection and Isolation Panel    Frequency of Communication with Friends and Family: Three times a week    Frequency of Social Gatherings with Friends and Family: Once a week    Attends  Religious Services: More than 4 times per year    Active Member of Clubs or Organizations: Yes    Attends Banker Meetings: 1 to 4 times per year    Marital Status: Married  Depression (PHQ2-9): Low Risk (09/25/2024)   Depression (PHQ2-9)    PHQ-2 Score: 1  Alcohol Screen: Low Risk (09/24/2024)   Alcohol Screen    Last Alcohol Screening Score (AUDIT): 2  Housing: Unknown (09/24/2024)   Epic    Unable to Pay for Housing in the Last Year: Not on file    Number of Times Moved in the Last Year: 0    Homeless in the Last Year: No  Utilities: Not on file  Health Literacy: Not on file     Review of Systems     Objective:     BP 118/80   Pulse 90   Temp 97.9 F (36.6 C) (Oral)   Ht 5' 11 (1.803 m)   Wt 217 lb 9.6 oz (98.7 kg)   SpO2 97%   BMI 30.35 kg/m  Wt Readings from Last 3 Encounters:  09/25/24 217 lb 9.6 oz (98.7 kg)  07/17/24 222 lb (100.7 kg)  06/21/24 222 lb (100.7 kg)    Physical Exam  {Perform Simple Foot Exam  Perform Detailed exam:1} {Insert foot Exam (Optional):30965}   Outpatient Encounter Medications as of 09/25/2024  Medication Sig   amLODipine  (NORVASC ) 10 MG tablet Take 1 tablet (10 mg total) by mouth daily.   amphetamine -dextroamphetamine  (ADDERALL) 10 MG tablet Take 1 tablet (10 mg total) by mouth daily with breakfast.   amphetamine -dextroamphetamine  (ADDERALL) 5 MG tablet Take 1 tablet (5 mg total) by mouth daily. Take in the afternoon.   amphetamine -dextroamphetamine  (ADDERALL) 5 MG tablet Take 1 tablet (5 mg total) by mouth daily. Takes in the afternoon.   Blood Glucose Monitoring Suppl DEVI USE TO CHECK BLOOD SUGARS ONCE DAILY. DX E11.9   busPIRone  (BUSPAR ) 5 MG tablet Take 1 tablet (5 mg total) by mouth 2 (two) times daily as needed.   empagliflozin  (JARDIANCE ) 25 MG TABS tablet Take 1 tablet (25 mg total) by mouth daily before breakfast.   Glucose Blood (BLOOD GLUCOSE TEST STRIPS) STRP USE TO CHECK BLOOD SUGARS ONCE DAILY. DX  E11.9   hydrochlorothiazide  (MICROZIDE ) 12.5 MG capsule Take 1 capsule (12.5 mg total) by mouth daily.   Lancet Device MISC USE TO CHECK BLOOD SUGARS ONCE DAILY. DX E11.9   Lancets Misc. MISC USE TO CHECK BLOOD SUGARS ONCE DAILY. DX E11.9   lisinopril  (ZESTRIL ) 5 MG tablet Take 1 tablet (5 mg total) by mouth  daily.   mometasone  (ELOCON ) 0.1 % cream Apply to aa's ears BID up to 5d/wk PRN.   montelukast  (SINGULAIR ) 10 MG tablet TAKE ONE TABLET EVERY DAY   Multiple Vitamin (MULTIVITAMIN) tablet Take 1 tablet by mouth daily.   ondansetron  (ZOFRAN ) 4 MG tablet Take 1 tablet (4 mg total) by mouth 2 (two) times daily as needed for nausea or vomiting.   rosuvastatin  (CRESTOR ) 10 MG tablet Take 1 tablet (10 mg total) by mouth daily.   Semaglutide , 1 MG/DOSE, (OZEMPIC , 1 MG/DOSE,) 4 MG/3ML SOPN INJECT 1MG  INTO THE SKIN ONCE WEEKLY AS DIRECTED   sildenafil  (REVATIO ) 20 MG tablet Take 2 tablets two hours before intercouse on an empty stomach.  Do not take with nitrates.   amphetamine -dextroamphetamine  (ADDERALL) 10 MG tablet Take 1 tablet (10 mg total) by mouth daily with breakfast.   amphetamine -dextroamphetamine  (ADDERALL) 10 MG tablet Take 1 tablet (10 mg total) by mouth daily with breakfast.   amphetamine -dextroamphetamine  (ADDERALL) 5 MG tablet Take 1 tablet (5 mg total) by mouth daily. Takes in the afternoon   amphetamine -dextroamphetamine  (ADDERALL) 5 MG tablet Take 1 tablet (5 mg total) by mouth daily. Takes in the afternoon   No facility-administered encounter medications on file as of 09/25/2024.     Lab Results  Component Value Date   WBC 7.1 08/27/2024   HGB 17.0 08/27/2024   HCT 50.0 08/27/2024   PLT 286.0 08/27/2024   GLUCOSE 159 (H) 07/05/2024   CHOL 111 07/05/2024   TRIG 203.0 (H) 07/05/2024   HDL 35.50 (L) 07/05/2024   LDLCALC 35 07/05/2024   ALT 25 07/05/2024   AST 19 07/05/2024   NA 139 07/05/2024   K 3.9 07/05/2024   CL 102 07/05/2024   CREATININE 1.11 07/05/2024   BUN 10  07/05/2024   CO2 28 07/05/2024   TSH 0.85 07/20/2023   PSA 0.40 07/05/2024   HGBA1C 7.5 (H) 07/05/2024   MICROALBUR <0.7 12/13/2023    CT CARDIAC SCORING (SELF PAY ONLY) Addendum Date: 01/06/2024 ADDENDUM REPORT: 01/06/2024 10:44 ADDENDUM: OVER-READ INTERPRETATION  CT CHEST The following report is an over-read performed by radiologist Dr. Franky Puffer [facility] on 12/30/2023. This over-read does not include interpretation of cardiac or coronary anatomy or pathology. The interpretation by the cardiologist is attached. COMPARISON:  None. FINDINGS: Within the visualized portions of the lungs demonstrates no acute infiltrates consolidations. No pulmonary nodules or masses. No pleural effusions. No mediastinal masses or adenopathy. No pericardial effusions. Aorta appears normal caliber. No acute findings in the upper abdomen. Chest wall and soft tissues are unremarkable without evidence of acute bony abnormalities. IMPRESSION: Unremarkable CT without evidence of pulmonary nodules or masses. Electronically Signed   By: Franky Chard M.D.   On: 01/06/2024 10:44   Result Date: 01/06/2024 CLINICAL DATA:  Risk stratification EXAM: Coronary Calcium  Score TECHNIQUE: The patient was scanned on a Siemens Somatom scanner. Axial non-contrast 3 mm slices were carried out through the heart. The data set was analyzed on a dedicated work station and scored using the Agatson method. FINDINGS: Non-cardiac: See separate report from [facility]. Ascending Aorta: Normal size Pericardium: Normal Coronary arteries: Normal origin of left and right coronary arteries. Distribution of arterial calcifications if present, as noted below; LM 0 LAD 120 LCx 0 RCA 0 Total 120 IMPRESSION AND RECOMMENDATION: 1. Coronary calcium  score of 120. This was 91st percentile for age and sex matched control. 2. CAC 100-299 in LAD. CAC-DRS A2/N1. 3. Recommend aspirin  and statin if no contraindication.  4. Continue heart healthy  lifestyle and risk factor modification. Electronically Signed: By: Redell Cave M.D. On: 01/02/2024 14:12       Assessment & Plan:  Healthcare maintenance Assessment & Plan: Physical today 09/25/24.  Colonoscopy 10/2019 - descending colon polyp (tubular adenoma) - Dr Therisa - recommended f/u in 7 years.  PSA 07/05/24 - .4.    Mixed hyperlipidemia  Type 2 diabetes mellitus with hyperglycemia, without long-term current use of insulin (HCC)  Hypertension, essential     Allena Hamilton, MD

## 2024-09-26 MED ORDER — AMPHETAMINE-DEXTROAMPHETAMINE 10 MG PO TABS: 10.0000 mg | ORAL_TABLET | Freq: Every day | ORAL | 0 refills | Status: AC

## 2024-10-01 ENCOUNTER — Encounter: Payer: Self-pay | Admitting: Internal Medicine

## 2024-10-01 NOTE — Assessment & Plan Note (Signed)
 Persistent right arm/elbow pain. Request referral to ortho.

## 2024-10-01 NOTE — Assessment & Plan Note (Signed)
 Has decreased his alcohol intake. Follow.

## 2024-10-01 NOTE — Assessment & Plan Note (Signed)
 Continue crestor . Low cholesterol diet and exercise. Follow lipid panel and liver function tests.   Lab Results  Component Value Date   CHOL 111 07/05/2024   HDL 35.50 (L) 07/05/2024   LDLCALC 35 07/05/2024   TRIG 203.0 (H) 07/05/2024   CHOLHDL 3 07/05/2024

## 2024-10-01 NOTE — Assessment & Plan Note (Signed)
 Continues on ozempic  and jaridance. Last labs - A1c increased - 7.5. wants to hold on changing medication. Low carb diet and exercise. Follow. If persistent elevation, will require adjustment in medication.

## 2024-10-01 NOTE — Assessment & Plan Note (Signed)
 Found on abdominal ultrasound. Continue diet, exercise and weight loss. Follow liver panel.

## 2024-10-01 NOTE — Assessment & Plan Note (Signed)
 Continue hydrochlorothiazide and amlodipine. No change today in medication. Follow pressures.  Follow metabolic panel.

## 2024-10-01 NOTE — Assessment & Plan Note (Signed)
 Overall appears to be handling things relatively well. Has buspar . No changes in medication. Follow.

## 2024-10-01 NOTE — Assessment & Plan Note (Signed)
 Doing well on current adderall regimen. Continue. No change in medication.

## 2024-10-03 ENCOUNTER — Other Ambulatory Visit: Payer: Self-pay | Admitting: Internal Medicine

## 2024-10-17 ENCOUNTER — Other Ambulatory Visit

## 2024-10-17 DIAGNOSIS — E782 Mixed hyperlipidemia: Secondary | ICD-10-CM | POA: Diagnosis not present

## 2024-10-17 DIAGNOSIS — I10 Essential (primary) hypertension: Secondary | ICD-10-CM | POA: Diagnosis not present

## 2024-10-17 DIAGNOSIS — E1165 Type 2 diabetes mellitus with hyperglycemia: Secondary | ICD-10-CM | POA: Diagnosis not present

## 2024-10-17 LAB — LIPID PANEL
Cholesterol: 119 mg/dL (ref 28–200)
HDL: 35.4 mg/dL — ABNORMAL LOW
LDL Cholesterol: 67 mg/dL (ref 10–99)
NonHDL: 83.74
Total CHOL/HDL Ratio: 3
Triglycerides: 83 mg/dL (ref 10.0–149.0)
VLDL: 16.6 mg/dL (ref 0.0–40.0)

## 2024-10-17 LAB — BASIC METABOLIC PANEL WITH GFR
BUN: 17 mg/dL (ref 6–23)
CO2: 28 meq/L (ref 19–32)
Calcium: 9.4 mg/dL (ref 8.4–10.5)
Chloride: 102 meq/L (ref 96–112)
Creatinine, Ser: 0.95 mg/dL (ref 0.40–1.50)
GFR: 93.46 mL/min
Glucose, Bld: 168 mg/dL — ABNORMAL HIGH (ref 70–99)
Potassium: 4.5 meq/L (ref 3.5–5.1)
Sodium: 137 meq/L (ref 135–145)

## 2024-10-17 LAB — CBC WITH DIFFERENTIAL/PLATELET
Basophils Absolute: 0.1 K/uL (ref 0.0–0.1)
Basophils Relative: 0.7 % (ref 0.0–3.0)
Eosinophils Absolute: 0.3 K/uL (ref 0.0–0.7)
Eosinophils Relative: 4.3 % (ref 0.0–5.0)
HCT: 48.9 % (ref 39.0–52.0)
Hemoglobin: 16.7 g/dL (ref 13.0–17.0)
Lymphocytes Relative: 30.8 % (ref 12.0–46.0)
Lymphs Abs: 2.4 K/uL (ref 0.7–4.0)
MCHC: 34.1 g/dL (ref 30.0–36.0)
MCV: 85.4 fl (ref 78.0–100.0)
Monocytes Absolute: 0.6 K/uL (ref 0.1–1.0)
Monocytes Relative: 8.3 % (ref 3.0–12.0)
Neutro Abs: 4.3 K/uL (ref 1.4–7.7)
Neutrophils Relative %: 55.9 % (ref 43.0–77.0)
Platelets: 264 K/uL (ref 150.0–400.0)
RBC: 5.72 Mil/uL (ref 4.22–5.81)
RDW: 13 % (ref 11.5–15.5)
WBC: 7.7 K/uL (ref 4.0–10.5)

## 2024-10-17 LAB — TSH: TSH: 1.48 u[IU]/mL (ref 0.35–5.50)

## 2024-10-17 LAB — HEPATIC FUNCTION PANEL
ALT: 27 U/L (ref 3–53)
AST: 19 U/L (ref 5–37)
Albumin: 4.3 g/dL (ref 3.5–5.2)
Alkaline Phosphatase: 63 U/L (ref 39–117)
Bilirubin, Direct: 0.1 mg/dL (ref 0.1–0.3)
Total Bilirubin: 0.6 mg/dL (ref 0.2–1.2)
Total Protein: 6.9 g/dL (ref 6.0–8.3)

## 2024-10-17 LAB — HEMOGLOBIN A1C: Hgb A1c MFr Bld: 8.1 % — ABNORMAL HIGH (ref 4.6–6.5)

## 2024-10-18 ENCOUNTER — Other Ambulatory Visit

## 2024-10-18 ENCOUNTER — Ambulatory Visit: Payer: Self-pay | Admitting: Internal Medicine

## 2024-10-26 ENCOUNTER — Encounter: Payer: Self-pay | Admitting: Internal Medicine

## 2024-10-26 NOTE — Telephone Encounter (Signed)
 Spoke to pt. Pt stated he would be in [place], and that the appt is only to discuss labs no procedure planned at this time

## 2024-10-26 NOTE — Telephone Encounter (Signed)
"  Ok for virtual?  "

## 2024-10-26 NOTE — Telephone Encounter (Signed)
 Need to confirm he is going to be in [place]. Also, is he scheduled for an upcoming procedure?  If so, when? If soon, may need to see in the office for pre op evaluation.  If regular appt, ok to do virtual if he is in [facility]

## 2024-10-29 ENCOUNTER — Telehealth: Admitting: Internal Medicine

## 2024-10-29 ENCOUNTER — Encounter: Payer: Self-pay | Admitting: Internal Medicine

## 2024-10-29 VITALS — Ht 71.0 in | Wt 216.0 lb

## 2024-10-29 DIAGNOSIS — M7711 Lateral epicondylitis, right elbow: Secondary | ICD-10-CM

## 2024-10-29 DIAGNOSIS — E1165 Type 2 diabetes mellitus with hyperglycemia: Secondary | ICD-10-CM

## 2024-10-29 DIAGNOSIS — K760 Fatty (change of) liver, not elsewhere classified: Secondary | ICD-10-CM

## 2024-10-29 DIAGNOSIS — E782 Mixed hyperlipidemia: Secondary | ICD-10-CM

## 2024-10-29 DIAGNOSIS — J4530 Mild persistent asthma, uncomplicated: Secondary | ICD-10-CM | POA: Diagnosis not present

## 2024-10-29 DIAGNOSIS — F909 Attention-deficit hyperactivity disorder, unspecified type: Secondary | ICD-10-CM | POA: Diagnosis not present

## 2024-10-29 DIAGNOSIS — I10 Essential (primary) hypertension: Secondary | ICD-10-CM

## 2024-10-29 DIAGNOSIS — F1029 Alcohol dependence with unspecified alcohol-induced disorder: Secondary | ICD-10-CM

## 2024-10-29 NOTE — Assessment & Plan Note (Addendum)
 Continues on ozempic  and jaridance. Last labs - A1c increased - 8.1. Low carb diet and exercise. Discussed increasing ozempic . Currently on 1mg  weekly. Discussed increasing to 2mg  weekly. Tolerating. After reviewing, will hold on increasing dose, given possible upcoming surgery. Continue diet adjustment. Follow met b and A1c.

## 2024-10-29 NOTE — Progress Notes (Signed)
 Patient ID: Francisco Jackson, male   DOB: [birth year], 49 y.o.   MRN: [id number]   Virtual Visit via video Note  I connected with Francisco Jackson by a video enabled telemedicine application and verified that I am speaking with the correct person using two identifiers. Location patient: home Location provider: work  Persons participating in the virtual visit: patient, provider  The limitations, risks, security and privacy concerns of performing an evaluation and management service by video and the availability of in person appointments have been discussed. It has also ben discussed with the patient that there may be a patient responsible charge related to this service. The patient expressed understanding and agreed to proceed.   Reason for visit: scheduled follow up.   HPI: Scheduled follow up - follow up regarding diabetes, ADHD, hypertension and hypercholesterolemia. Continues on jardiance  and ozempic . Reports sugars averaging 130 - 140s am. Continues on adderall - 10mg  in am and 5mg  in pm. Doing well on this regimen. Recent labs - A1c 8.1. Since labs, he has adjusted his diet some. Decreased ice cream and fruit intake. States sugars are trending down. 180-190s after meal. Saw Dr Edie 10/26/24 - persistent right elbow pain. Discussed surgery. Denies any chest pain or sob. No abdominal pain or bowel change. Tolerating ozempic . Discussed increasing dose.    ROS: See pertinent positives and negatives per HPI.  Past Medical History:  Diagnosis Date   Asthma    Atypical mole 04/05/2013   Left forearm. Atypical combined nevus, moderate atypia.   Diabetes mellitus without complication (HCC)    Dysplastic nevus 04/23/2014   Left prox. mid. tricep axilla. Mild atypia, margins free.   Hypertension    Personality disorder (HCC)    borderline    Past Surgical History:  Procedure Laterality Date   ANTERIOR CRUCIATE LIGAMENT REPAIR Right 2006   CERVICAL DISC SURGERY  2010   COLONOSCOPY WITH  PROPOFOL  N/A 11/01/2019   Procedure: COLONOSCOPY WITH PROPOFOL ;  Surgeon: Therisa Bi, MD;  Location: [facility] ENDOSCOPY;  Service: Gastroenterology;  Laterality: N/A;   MENISCUS REPAIR Left 1996    Family History  Problem Relation Age of Onset   Diabetes Mother    Melanoma Mother    Diabetes Paternal Aunt    Heart attack Paternal Aunt    Diabetes Paternal Uncle    Diabetes Maternal Grandmother    Diabetes Maternal Grandfather    Heart disease Maternal Grandfather    Heart attack Maternal Grandfather    Heart attack Father    Hypertension Father    Diabetes Sister    Hypertension Sister    Kidney cancer Neg Hx    Kidney disease Neg Hx    Prostate cancer Neg Hx     SOCIAL HX: reviewed.   Current Medications[1]  EXAM:  GENERAL: alert, oriented, appears well and in no acute distress  HEENT: atraumatic, conjunttiva clear, no obvious abnormalities on inspection of external nose and ears  NECK: normal movements of the head and neck  LUNGS: on inspection no signs of respiratory distress, breathing rate appears normal, no obvious gross SOB, gasping or wheezing  CV: no obvious cyanosis  PSYCH/NEURO: pleasant and cooperative, no obvious depression or anxiety, speech and thought processing grossly intact  ASSESSMENT AND PLAN:  Discussed the following assessment and plan:  Problem List Items Addressed This Visit     ADHD - Primary   Doing well on current adderall regimen. Continue. No change in medication. Follow.       Asthma, mild  persistent   Has seen Dr Frutoso.  Prescribed singulair , symbicort  and astelin.  Breathing stable. Denies sob. Follow.       Diabetes mellitus (HCC)   Continues on ozempic  and jaridance. Last labs - A1c increased - 8.1. Low carb diet and exercise. Discussed increasing ozempic . Currently on 1mg  weekly. Discussed increasing to 2mg  weekly. Tolerating. After reviewing, will hold on increasing dose, given possible upcoming surgery. Continue diet  adjustment. Follow met b and A1c.       Relevant Medications   empagliflozin  (JARDIANCE ) 25 MG TABS tablet   lisinopril  (ZESTRIL ) 5 MG tablet   rosuvastatin  (CRESTOR ) 10 MG tablet   Other Relevant Orders   Hemoglobin A1c   Epicondylitis, lateral, right   Evaluated by Dr Edie. Discussed surgery. Not scheduled, but plans to pursue surgical intervention. Denies chest pain or sob. Blood pressure as outlined. Will need close intra op and post op monitoring of heart rate and blood pressure to avoid extremes. Discussed will need to hold ozempic  one week prior to surgery.       EtOH dependence (HCC)   Has decreased his alcohol intake. Follow.       Hepatic steatosis   Found on abdominal ultrasound. Continue diet, exercise and weight loss. Recent liver panel wnl. Continue ozempic .       Hypertension, essential   Continue hydrochlorothiazide  and amlodipine . No change today in medication. Follow pressures. Follow metabolic panel.       Relevant Medications   amLODipine  (NORVASC ) 10 MG tablet   hydrochlorothiazide  (MICROZIDE ) 12.5 MG capsule   lisinopril  (ZESTRIL ) 5 MG tablet   rosuvastatin  (CRESTOR ) 10 MG tablet   Other Relevant Orders   Basic metabolic panel   Mixed hyperlipidemia   Continue crestor . Low cholesterol diet and exercise. Follow lipid panel. .   Lab Results  Component Value Date   CHOL 119 10/17/2024   HDL 35.40 (L) 10/17/2024   LDLCALC 67 10/17/2024   TRIG 83.0 10/17/2024   CHOLHDL 3 10/17/2024         Relevant Medications   amLODipine  (NORVASC ) 10 MG tablet   hydrochlorothiazide  (MICROZIDE ) 12.5 MG capsule   lisinopril  (ZESTRIL ) 5 MG tablet   rosuvastatin  (CRESTOR ) 10 MG tablet   Other Relevant Orders   Hepatic function panel   Lipid panel   TSH    Return for keep scheduled. .   I discussed the assessment and treatment plan with the patient. The patient was provided an opportunity to ask questions and all were answered. The patient agreed with the plan  and demonstrated an understanding of the instructions.   The patient was advised to call back or seek an in-person evaluation if the symptoms worsen or if the condition fails to improve as anticipated.    Allena Hamilton, MD       [1]  Current Outpatient Medications:    amphetamine -dextroamphetamine  (ADDERALL) 10 MG tablet, TAKE ONE TABLET BY MOUTH ONCE DAILY WITHBREAKFAST, Disp: 30 tablet, Rfl: 0   amphetamine -dextroamphetamine  (ADDERALL) 10 MG tablet, Take 1 tablet (10 mg total) by mouth daily with breakfast., Disp: 30 tablet, Rfl: 0   amphetamine -dextroamphetamine  (ADDERALL) 10 MG tablet, Take 1 tablet (10 mg total) by mouth daily with breakfast., Disp: 30 tablet, Rfl: 0   amphetamine -dextroamphetamine  (ADDERALL) 5 MG tablet, Take 1 tablet (5 mg total) by mouth daily. Take in the afternoon., Disp: 30 tablet, Rfl: 0   amphetamine -dextroamphetamine  (ADDERALL) 5 MG tablet, Take 1 tablet (5 mg total) by mouth daily. Takes in the afternoon.,  Disp: 30 tablet, Rfl: 0   Blood Glucose Monitoring Suppl DEVI, USE TO CHECK BLOOD SUGARS ONCE DAILY. DX E11.9, Disp: 1 each, Rfl: 0   Glucose Blood (BLOOD GLUCOSE TEST STRIPS) STRP, USE TO CHECK BLOOD SUGARS ONCE DAILY. DX E11.9, Disp: 100 strip, Rfl: 12   Lancet Device MISC, USE TO CHECK BLOOD SUGARS ONCE DAILY. DX E11.9, Disp: 1 each, Rfl: 0   Lancets Misc. MISC, USE TO CHECK BLOOD SUGARS ONCE DAILY. DX E11.9, Disp: 100 each, Rfl: 12   mometasone  (ELOCON ) 0.1 % cream, Apply to aa's ears BID up to 5d/wk PRN., Disp: 15 g, Rfl: 1   montelukast  (SINGULAIR ) 10 MG tablet, TAKE ONE TABLET EVERY DAY, Disp: 90 tablet, Rfl: 3   Multiple Vitamin (MULTIVITAMIN) tablet, Take 1 tablet by mouth daily., Disp: , Rfl:    ondansetron  (ZOFRAN ) 4 MG tablet, Take 1 tablet (4 mg total) by mouth 2 (two) times daily as needed for nausea or vomiting., Disp: 14 tablet, Rfl: 0   Semaglutide , 1 MG/DOSE, (OZEMPIC , 1 MG/DOSE,) 4 MG/3ML SOPN, Inject 1 mg into the skin once a week.,  Disp: 3 mL, Rfl: 1   sildenafil  (REVATIO ) 20 MG tablet, Take 2 tablets two hours before intercouse on an empty stomach.  Do not take with nitrates., Disp: 30 tablet, Rfl: 1   amLODipine  (NORVASC ) 10 MG tablet, Take 1 tablet (10 mg total) by mouth daily., Disp: 90 tablet, Rfl: 1   busPIRone  (BUSPAR ) 5 MG tablet, Take 1 tablet (5 mg total) by mouth 2 (two) times daily as needed., Disp: 180 tablet, Rfl: 0   empagliflozin  (JARDIANCE ) 25 MG TABS tablet, Take 1 tablet (25 mg total) by mouth daily before breakfast., Disp: 90 tablet, Rfl: 1   hydrochlorothiazide  (MICROZIDE ) 12.5 MG capsule, Take 1 capsule (12.5 mg total) by mouth daily., Disp: 90 capsule, Rfl: 1   lisinopril  (ZESTRIL ) 5 MG tablet, Take 1 tablet (5 mg total) by mouth daily., Disp: 90 tablet, Rfl: 1   rosuvastatin  (CRESTOR ) 10 MG tablet, Take 1 tablet (10 mg total) by mouth daily., Disp: 90 tablet, Rfl: 3

## 2024-10-31 ENCOUNTER — Other Ambulatory Visit: Payer: Self-pay | Admitting: Surgery

## 2024-11-03 ENCOUNTER — Other Ambulatory Visit: Payer: Self-pay | Admitting: Internal Medicine

## 2024-11-03 ENCOUNTER — Telehealth: Payer: Self-pay | Admitting: Internal Medicine

## 2024-11-03 ENCOUNTER — Encounter: Payer: Self-pay | Admitting: Internal Medicine

## 2024-11-03 MED ORDER — AMLODIPINE BESYLATE 10 MG PO TABS: 10.0000 mg | ORAL_TABLET | Freq: Every day | ORAL | 1 refills | Status: AC

## 2024-11-03 MED ORDER — EMPAGLIFLOZIN 25 MG PO TABS: 25.0000 mg | ORAL_TABLET | Freq: Every day | ORAL | 1 refills | Status: AC

## 2024-11-03 MED ORDER — BUSPIRONE HCL 5 MG PO TABS: 5.0000 mg | ORAL_TABLET | Freq: Two times a day (BID) | ORAL | 0 refills | Status: AC | PRN

## 2024-11-03 MED ORDER — ROSUVASTATIN CALCIUM 10 MG PO TABS: 10.0000 mg | ORAL_TABLET | Freq: Every day | ORAL | 3 refills | Status: AC

## 2024-11-03 MED ORDER — HYDROCHLOROTHIAZIDE 12.5 MG PO CAPS: 12.5000 mg | ORAL_CAPSULE | Freq: Every day | ORAL | 1 refills | Status: AC

## 2024-11-03 MED ORDER — LISINOPRIL 5 MG PO TABS: 5.0000 mg | ORAL_TABLET | Freq: Every day | ORAL | 1 refills | Status: AC

## 2024-11-03 NOTE — Assessment & Plan Note (Signed)
 Evaluated by Dr Edie. Discussed surgery. Not scheduled, but plans to pursue surgical intervention. Denies chest pain or sob. Blood pressure as outlined. Will need close intra op and post op monitoring of heart rate and blood pressure to avoid extremes. Discussed will need to hold ozempic  one week prior to surgery.

## 2024-11-03 NOTE — Assessment & Plan Note (Signed)
 Doing well on current adderall regimen. Continue. No change in medication. Follow.

## 2024-11-03 NOTE — Assessment & Plan Note (Signed)
 Found on abdominal ultrasound. Continue diet, exercise and weight loss. Recent liver panel wnl. Continue ozempic .

## 2024-11-03 NOTE — Assessment & Plan Note (Signed)
 Continue crestor . Low cholesterol diet and exercise. Follow lipid panel. .   Lab Results  Component Value Date   CHOL 119 10/17/2024   HDL 35.40 (L) 10/17/2024   LDLCALC 67 10/17/2024   TRIG 83.0 10/17/2024   CHOLHDL 3 10/17/2024

## 2024-11-03 NOTE — Telephone Encounter (Signed)
 Notified pt via my chart to hold on increasing dose of ozempic .

## 2024-11-03 NOTE — Assessment & Plan Note (Signed)
 Has seen Dr Frutoso.  Prescribed singulair , symbicort  and astelin.  Breathing stable. Denies sob. Follow.

## 2024-11-03 NOTE — Assessment & Plan Note (Signed)
 Has decreased his alcohol intake. Follow.

## 2024-11-03 NOTE — Assessment & Plan Note (Signed)
 Continue hydrochlorothiazide and amlodipine. No change today in medication. Follow pressures.  Follow metabolic panel.

## 2024-11-06 MED ORDER — AMPHETAMINE-DEXTROAMPHETAMINE 5 MG PO TABS: 5.0000 mg | ORAL_TABLET | Freq: Every day | ORAL | 0 refills | Status: AC

## 2024-11-06 NOTE — Telephone Encounter (Signed)
Rx ok'd for adderall  

## 2024-11-09 ENCOUNTER — Encounter
Admission: RE | Admit: 2024-11-09 | Discharge: 2024-11-09 | Disposition: A | Source: Ambulatory Visit | Attending: Surgery

## 2024-11-09 ENCOUNTER — Other Ambulatory Visit: Payer: Self-pay

## 2024-11-09 DIAGNOSIS — E1165 Type 2 diabetes mellitus with hyperglycemia: Secondary | ICD-10-CM

## 2024-11-09 DIAGNOSIS — Z01812 Encounter for preprocedural laboratory examination: Secondary | ICD-10-CM

## 2024-11-09 DIAGNOSIS — I10 Essential (primary) hypertension: Secondary | ICD-10-CM

## 2024-11-09 DIAGNOSIS — M7711 Lateral epicondylitis, right elbow: Secondary | ICD-10-CM

## 2024-11-09 HISTORY — DX: Lateral epicondylitis, right elbow: M77.11

## 2024-11-09 NOTE — Patient Instructions (Addendum)
 Your procedure is scheduled on: 11/14/2024 Report to the Registration Desk on the 1st floor of the [facility]. To find out your arrival time, please call [phone] between 1PM - 3PM on: 11/13/2024 If your arrival time is 6:00 am, do not arrive before that time as the [facility] entrance doors do not open until 6:00 am.  REMEMBER: Instructions that are not followed completely may result in serious medical risk, up to and including death; or upon the discretion of your surgeon and anesthesiologist your surgery may need to be rescheduled.  Do not eat food or drink anything after midnight the night before surgery except sips of water with medication. No gum chewing or hard candies.    One week prior to surgery: Stop Anti-inflammatories (NSAIDS) such as Advil, Aleve, Ibuprofen, Motrin, Naproxen, Naprosyn and Aspirin  based products such as Excedrin, Goody's Powder, BC Powder. Stop ANY OVER THE COUNTER supplements until after surgery like multivitamin   You may however, continue to take Tylenol  if needed for pain up until the day of surgery.  **Follow guidelines for insulin and diabetes medications.** Stop Jardiance  3 days before surgery. Last dose is Jan 31    Continue taking all of your other prescription medications up until the day of surgery.  But , ON THE DAY OF SURGERY ONLY TAKE THESE MEDICATIONS WITH SIPS OF WATER:  amLODipine  (NORVASC ) rosuvastatin  (CRESTOR )    No Alcohol for 24 hours before or after surgery.  No Smoking including e-cigarettes for 24 hours before surgery.  No chewable tobacco products for at least 6 hours before surgery.  No nicotine patches on the day of surgery.  Do not use any recreational drugs for at least a week (preferably 2 weeks) before your surgery.  Please be advised that the combination of cocaine and anesthesia may have negative outcomes, up to and including death. If you test positive for cocaine, your surgery will be cancelled.  On  the morning of surgery brush your teeth with toothpaste and water, you may rinse your mouth with mouthwash if you wish. Do not swallow any toothpaste or mouthwash.  Use CHG Soap or wipes as directed on instruction sheet.-provided for you   Do not wear jewelry, make-up, hairpins, clips or nail polish.  For welded (permanent) jewelry: bracelets, anklets, waist bands, etc.  Please have this removed prior to surgery.  If it is not removed, there is a chance that hospital personnel will need to cut it off on the day of surgery.  Do not wear lotions, powders, or perfumes.   Do not shave body hair from the neck down 48 hours before surgery.  Contact lenses, hearing aids and dentures may not be worn into surgery.  Do not bring valuables to the hospital. [facility] is not responsible for any missing/lost belongings or valuables.   Notify your doctor if there is any change in your medical condition (cold, fever, infection).  Wear comfortable clothing (specific to your surgery type) to the hospital.  After surgery, you can help prevent lung complications by doing breathing exercises.  Take deep breaths and cough every 1-2 hours. Your doctor may order a device called an Incentive Spirometer to help you take deep breaths.  If you are being admitted to the hospital overnight, leave your suitcase in the car. After surgery it may be brought to your room.  In case of increased patient census, it may be necessary for you, the patient, to continue your postoperative care in the Same Day Surgery  department.  If you are being discharged the day of surgery, you will not be allowed to drive home. You will need a responsible individual to drive you home and stay with you for 24 hours after surgery.    Please call the Pre-admissions Testing Dept. at [phone] if you have any questions about these instructions.  Surgery Visitation Policy:  Patients having surgery or a procedure may have two visitors.   Children under the age of 3 must have an adult with them who is not the patient.   Merchandiser, Retail to address health-related social needs:  [email or web address]                                                                                                              Preparing for Surgery with CHLORHEXIDINE  GLUCONATE (CHG) Soap  Chlorhexidine  Gluconate (CHG) Soap  o An antiseptic cleaner that kills germs and bonds with the skin to continue killing germs even after washing  o Used for showering the night before surgery and morning of surgery  Before surgery, you can play an important role by reducing the number of germs on your skin.  CHG (Chlorhexidine  gluconate) soap is an antiseptic cleanser which kills germs and bonds with the skin to continue killing germs even after washing.  Please do not use if you have an allergy to CHG or antibacterial soaps. If your skin becomes reddened/irritated stop using the CHG.  1. Shower the NIGHT BEFORE SURGERY with CHG soap.  2. If you choose to wash your hair, wash your hair first as usual with your normal shampoo.  3. After shampooing, rinse your hair and body thoroughly to remove the shampoo.  4. Use CHG as you would any other liquid soap. You can apply CHG directly to the skin and wash gently with a clean washcloth.  5. Apply the CHG soap to your body only from the neck down. Do not use on open wounds or open sores. Avoid contact with your eyes, ears, mouth, and genitals (private parts). Wash face and genitals (private parts) with your normal soap.  6. Wash thoroughly, paying special attention to the area where your surgery will be performed.  7. Thoroughly rinse your body with warm water.  8. Do not shower/wash with your normal soap after using and rinsing off the CHG soap.  9. Do not use lotions, oils, etc., after showering with CHG.  10. Pat yourself dry with a clean towel.  11. Wear clean pajamas to  bed the night before surgery.  12. Place clean sheets on your bed the night of your shower and do not sleep with pets.  13. Do not apply any deodorants/lotions/powders.  14. Please wear clean clothes to the hospital.  15. Remember to brush your teeth with your regular toothpaste.

## 2024-11-13 ENCOUNTER — Encounter: Payer: Self-pay | Admitting: Urgent Care

## 2024-11-13 ENCOUNTER — Inpatient Hospital Stay: Admission: RE | Admit: 2024-11-13 | Discharge: 2024-11-13 | Attending: Surgery

## 2024-11-13 DIAGNOSIS — Z01810 Encounter for preprocedural cardiovascular examination: Secondary | ICD-10-CM | POA: Insufficient documentation

## 2024-11-13 DIAGNOSIS — Z01812 Encounter for preprocedural laboratory examination: Secondary | ICD-10-CM

## 2024-11-13 DIAGNOSIS — M7711 Lateral epicondylitis, right elbow: Secondary | ICD-10-CM | POA: Insufficient documentation

## 2024-11-13 DIAGNOSIS — I10 Essential (primary) hypertension: Secondary | ICD-10-CM | POA: Insufficient documentation

## 2024-11-14 ENCOUNTER — Ambulatory Visit: Admitting: Anesthesiology

## 2024-11-14 ENCOUNTER — Encounter: Payer: Self-pay | Admitting: Surgery

## 2024-11-14 ENCOUNTER — Ambulatory Visit: Payer: Self-pay | Admitting: Urgent Care

## 2024-11-14 ENCOUNTER — Other Ambulatory Visit: Payer: Self-pay

## 2024-11-14 ENCOUNTER — Encounter: Admission: RE | Disposition: A | Payer: Self-pay | Source: Home / Self Care | Attending: Surgery

## 2024-11-14 ENCOUNTER — Ambulatory Visit
Admission: RE | Admit: 2024-11-14 | Discharge: 2024-11-14 | Disposition: A | Source: Home / Self Care | Attending: Surgery | Admitting: Surgery

## 2024-11-14 DIAGNOSIS — E1165 Type 2 diabetes mellitus with hyperglycemia: Secondary | ICD-10-CM

## 2024-11-14 DIAGNOSIS — Z01812 Encounter for preprocedural laboratory examination: Secondary | ICD-10-CM

## 2024-11-14 LAB — GLUCOSE, CAPILLARY
Glucose-Capillary: 145 mg/dL — ABNORMAL HIGH (ref 70–99)
Glucose-Capillary: 144 mg/dL — ABNORMAL HIGH (ref 70–99)

## 2024-11-14 MED ORDER — 0.9 % SODIUM CHLORIDE (POUR BTL) OPTIME
TOPICAL | Status: DC | PRN
  Administered 2024-11-14: 500 mL

## 2024-11-14 MED ORDER — CEFAZOLIN SODIUM-DEXTROSE 2-4 GM/100ML-% IV SOLN
INTRAVENOUS | Status: AC
  Filled 2024-11-14: qty 100

## 2024-11-14 MED ORDER — HYDROCODONE-ACETAMINOPHEN 5-325 MG PO TABS: 1.0000 | ORAL_TABLET | Freq: Four times a day (QID) | ORAL | 0 refills | Status: AC | PRN

## 2024-11-14 MED ORDER — SODIUM CHLORIDE 0.9 % IV SOLN: INTRAVENOUS | Status: DC

## 2024-11-14 MED ORDER — CEFAZOLIN SODIUM-DEXTROSE 2-4 GM/100ML-% IV SOLN
2.0000 g | INTRAVENOUS | Status: AC
  Administered 2024-11-14: 2 g via INTRAVENOUS

## 2024-11-14 MED ORDER — ONDANSETRON HCL 4 MG/2ML IJ SOLN: 4.0000 mg | Freq: Four times a day (QID) | INTRAMUSCULAR | Status: DC | PRN

## 2024-11-14 MED ORDER — FENTANYL CITRATE (PF) 100 MCG/2ML IJ SOLN
INTRAMUSCULAR | Status: AC
  Filled 2024-11-14: qty 2

## 2024-11-14 MED ORDER — MIDAZOLAM HCL 2 MG/2ML IJ SOLN
INTRAMUSCULAR | Status: AC
  Filled 2024-11-14: qty 2

## 2024-11-14 MED ORDER — ACETAMINOPHEN 325 MG PO TABS: 325.0000 mg | ORAL_TABLET | Freq: Four times a day (QID) | ORAL | Status: DC | PRN

## 2024-11-14 MED ORDER — KETOROLAC TROMETHAMINE 30 MG/ML IJ SOLN: 30.0000 mg | Freq: Once | INTRAMUSCULAR | Status: DC

## 2024-11-14 MED ORDER — CHLORHEXIDINE GLUCONATE 0.12 % MT SOLN: 15.0000 mL | Freq: Once | OROMUCOSAL | Status: DC

## 2024-11-14 MED ORDER — METOCLOPRAMIDE HCL 5 MG/ML IJ SOLN: 5.0000 mg | Freq: Three times a day (TID) | INTRAMUSCULAR | Status: DC | PRN

## 2024-11-14 MED ORDER — PROPOFOL 10 MG/ML IV BOLUS
INTRAVENOUS | Status: DC | PRN
  Administered 2024-11-14: 200 mg via INTRAVENOUS

## 2024-11-14 MED ORDER — FENTANYL CITRATE (PF) 100 MCG/2ML IJ SOLN
INTRAMUSCULAR | Status: DC | PRN
  Administered 2024-11-14: 50 ug via INTRAVENOUS
  Administered 2024-11-14 (×2): 25 ug via INTRAVENOUS

## 2024-11-14 MED ORDER — DROPERIDOL 2.5 MG/ML IJ SOLN: 0.6250 mg | Freq: Once | INTRAMUSCULAR | Status: DC | PRN

## 2024-11-14 MED ORDER — OXYCODONE HCL 5 MG/5ML PO SOLN: 5.0000 mg | Freq: Once | ORAL | Status: AC | PRN

## 2024-11-14 MED ORDER — OXYCODONE HCL 5 MG PO TABS
5.0000 mg | ORAL_TABLET | Freq: Once | ORAL | Status: AC | PRN
  Administered 2024-11-14: 5 mg via ORAL

## 2024-11-14 MED ORDER — LIDOCAINE HCL (PF) 2 % IJ SOLN
INTRAMUSCULAR | Status: AC
  Filled 2024-11-14: qty 5

## 2024-11-14 MED ORDER — SEVOFLURANE IN SOLN
RESPIRATORY_TRACT | Status: AC
  Filled 2024-11-14: qty 250

## 2024-11-14 MED ORDER — DEXMEDETOMIDINE HCL IN NACL 80 MCG/20ML IV SOLN
INTRAVENOUS | Status: AC
  Filled 2024-11-14: qty 20

## 2024-11-14 MED ORDER — ORAL CARE MOUTH RINSE: 15.0000 mL | Freq: Once | OROMUCOSAL | Status: DC

## 2024-11-14 MED ORDER — FENTANYL CITRATE (PF) 100 MCG/2ML IJ SOLN
25.0000 ug | INTRAMUSCULAR | Status: DC | PRN
  Administered 2024-11-14: 50 ug via INTRAVENOUS
  Administered 2024-11-14 (×2): 25 ug via INTRAVENOUS

## 2024-11-14 MED ORDER — KETOROLAC TROMETHAMINE 30 MG/ML IJ SOLN
INTRAMUSCULAR | Status: AC
  Filled 2024-11-14: qty 1

## 2024-11-14 MED ORDER — PHENYLEPHRINE 80 MCG/ML (10ML) SYRINGE FOR IV PUSH (FOR BLOOD PRESSURE SUPPORT)
PREFILLED_SYRINGE | INTRAVENOUS | Status: DC | PRN
  Administered 2024-11-14 (×4): 80 ug via INTRAVENOUS

## 2024-11-14 MED ORDER — ACETAMINOPHEN 10 MG/ML IV SOLN
INTRAVENOUS | Status: DC | PRN
  Administered 2024-11-14: 1000 mg via INTRAVENOUS

## 2024-11-14 MED ORDER — BUPIVACAINE HCL (PF) 0.5 % IJ SOLN
INTRAMUSCULAR | Status: AC
  Filled 2024-11-14: qty 30

## 2024-11-14 MED ORDER — BUPIVACAINE-EPINEPHRINE (PF) 0.5% -1:200000 IJ SOLN
INTRAMUSCULAR | Status: AC
  Filled 2024-11-14: qty 30

## 2024-11-14 MED ORDER — PROPOFOL 10 MG/ML IV BOLUS
INTRAVENOUS | Status: AC
  Filled 2024-11-14: qty 40

## 2024-11-14 MED ORDER — HYDROCODONE-ACETAMINOPHEN 5-325 MG PO TABS: 1.0000 | ORAL_TABLET | ORAL | Status: DC | PRN

## 2024-11-14 MED ORDER — ACETAMINOPHEN 10 MG/ML IV SOLN
INTRAVENOUS | Status: AC
  Filled 2024-11-14: qty 100

## 2024-11-14 MED ORDER — METOCLOPRAMIDE HCL 10 MG PO TABS: 5.0000 mg | ORAL_TABLET | Freq: Three times a day (TID) | ORAL | Status: DC | PRN

## 2024-11-14 MED ORDER — DEXAMETHASONE SOD PHOSPHATE PF 10 MG/ML IJ SOLN
INTRAMUSCULAR | Status: DC | PRN
  Administered 2024-11-14: 5 mg via INTRAVENOUS

## 2024-11-14 MED ORDER — BUPIVACAINE-EPINEPHRINE (PF) 0.5% -1:200000 IJ SOLN
INTRAMUSCULAR | Status: DC | PRN
  Administered 2024-11-14: 20 mL

## 2024-11-14 MED ORDER — ONDANSETRON HCL 4 MG PO TABS: 4.0000 mg | ORAL_TABLET | Freq: Four times a day (QID) | ORAL | Status: DC | PRN

## 2024-11-14 MED ORDER — CHLORHEXIDINE GLUCONATE 0.12 % MT SOLN
OROMUCOSAL | Status: AC
  Filled 2024-11-14: qty 15

## 2024-11-14 MED ORDER — ACETAMINOPHEN 10 MG/ML IV SOLN: 1000.0000 mg | Freq: Once | INTRAVENOUS | Status: DC | PRN

## 2024-11-14 MED ORDER — LIDOCAINE HCL (CARDIAC) PF 100 MG/5ML IV SOSY
PREFILLED_SYRINGE | INTRAVENOUS | Status: DC | PRN
  Administered 2024-11-14: 100 mg via INTRAVENOUS

## 2024-11-14 MED ORDER — DEXMEDETOMIDINE HCL IN NACL 80 MCG/20ML IV SOLN
INTRAVENOUS | Status: DC | PRN
  Administered 2024-11-14: 8 ug via INTRAVENOUS

## 2024-11-14 MED ORDER — MIDAZOLAM HCL (PF) 2 MG/2ML IJ SOLN
INTRAMUSCULAR | Status: DC | PRN
  Administered 2024-11-14: 2 mg via INTRAVENOUS

## 2024-11-14 MED ORDER — KETOROLAC TROMETHAMINE 30 MG/ML IJ SOLN
INTRAMUSCULAR | Status: DC | PRN
  Administered 2024-11-14: 30 mg via INTRAVENOUS

## 2024-11-14 MED ORDER — ONDANSETRON HCL 4 MG/2ML IJ SOLN
INTRAMUSCULAR | Status: AC
  Filled 2024-11-14: qty 2

## 2024-11-14 MED ORDER — LACTATED RINGERS IV SOLN: INTRAVENOUS | Status: DC | PRN

## 2024-11-14 MED ORDER — DEXAMETHASONE SOD PHOSPHATE PF 10 MG/ML IJ SOLN
INTRAMUSCULAR | Status: AC
  Filled 2024-11-14: qty 1

## 2024-11-14 MED ORDER — OXYCODONE HCL 5 MG PO TABS
ORAL_TABLET | ORAL | Status: AC
  Filled 2024-11-14: qty 1

## 2024-11-14 MED ORDER — ONDANSETRON HCL 4 MG/2ML IJ SOLN
INTRAMUSCULAR | Status: DC | PRN
  Administered 2024-11-14: 4 mg via INTRAVENOUS

## 2024-11-14 NOTE — H&P (Signed)
 History of Present Illness:  Francisco Jackson is a 49 y.o. male who presents for evaluation and treatment along lateral greater than medial right elbow pain. The patient notes that his symptoms have been present for over a year and developed after he sustained a twisting injury to the elbow while drilling a hole with a drill. Initially he tried to ignore the symptoms, but because of continued symptoms, he went to [facility]. He saw Dr. Francisco who gave him some exercises to do at home. He also underwent an MRI of the elbow which demonstrated some tendinopathy with partial tearing of the common extensor origin more so than the common flexor origin. He was advised to follow-up about 3 months ago but he did not. He did see his primary care provider last month and mentioned the symptoms to her, so she has referred her to me for further evaluation and treatment.  The patient notes that he is quite active and has been quite active all his life, playing baseball and competitive softball for many years. He recalls having some discomfort in his elbow for many years primarily due to pitching and throwing activities, but it was not until his elbow was torqued during that drilling incident noted above that he began having more severe pain. He notes that his activities are worse with lifting anything with weight. He also enjoys bow hunting but is unable to do so without aggravating his elbow symptoms. He also notes occasional discomfort at night. He localizes the pain to the medial and lateral aspects of the elbow. Laterally, the pain will radiate into the proximal forearm region. He also notes occasional radiation of discomfort into the ring and little fingers of his right hand, but denies any numbness or paresthesias to his fingers. 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  Current Outpatient Medications:  OZEMPIC  1 mg/dose (4 mg/3 mL) pen injector INJECT 1MG  INTO THE SKIN ONCE WEEKLY AS DIRECTED   albuterol  90 mcg/actuation inhaler Inhale 2 inhalations into the lungs every 6 (six) hours as needed for Wheezing.  amLODIPine  (NORVASC ) 10 MG tablet 2  budesonide -formoterol  (SYMBICORT ) 160-4.5 mcg/actuation inhaler Inhale into the lungs  empagliflozin  (JARDIANCE ) 10 mg tablet Take 1 tablet by mouth once daily  fexofenadine (ALLEGRA) 180 MG tablet Take 180 mg by mouth once daily.  fluticasone  (FLONASE ) 50 mcg/actuation nasal spray Place 2 sprays into both nostrils once daily.  hydroCHLOROthiazide  (MICROZIDE ) 12.5 mg capsule 1  lisinopril  (ZESTRIL ) 5 MG tablet Take 1 tablet (5 mg total) by mouth once daily 30 tablet 11  metFORMIN  (GLUCOPHAGE -XR) 500 MG XR tablet Take 2 tablets (1,000 mg total) by mouth 2 (two) times daily 120 tablet 11  montelukast  (SINGULAIR ) 10 mg tablet TAKE ONE TABLET BY MOUTH EVERY MORNING  rosuvastatin  (CRESTOR ) 5 MG tablet TAKE ONE TABLET EVERY DAY  sertraline  (ZOLOFT ) 100 MG tablet TAKE ONE TABLET BY MOUTH EVERY DAY  testosterone  30 mg/actuation (1.5 mL) SlPm Place onto the skin  traMADoL  (ULTRAM ) 50 mg tablet Take 1 tablet by mouth as needed  TRULICITY  1.5 mg/0.5 mL subcutaneous injection INJECT 1 PEN (1.5MG ) SUBCUTANEOUSLY ONCEEVERY 7 DAYS 2 mL 0   Allergies:  Losartan  Other (Severe Chest pain)   Past Medical History:  Depression (emotion)  Diabetes (CMS/HHS-HCC)   Past Surgical History:  ARTHROSCOPIC REPAIR ACL 2007  disc surgery 2011  knee surgery   Family History:  Diabetes Mother  Melanoma Mother  Heart disease Father   Social History:   Socioeconomic History:  Marital status: Married  Tobacco Use  Smoking status: Former  Current packs/day: 0.00  Average packs/day: 1 pack/day for 8.0 years (8.0 ttl pk-yrs)  Types: Cigarettes  Start date: 107  Quit date: 2004  Years since quitting: 22.0  Smokeless tobacco: Former  Substance and Sexual Activity  Alcohol use: Yes   Social Drivers of Health:   Physicist, Medical Strain: Low Risk  (09/24/2024)  Received from [facility]  Overall Financial Resource Strain (CARDIA)  How hard is it for you to pay for the very basics like food, housing, medical care, and heating?: Not hard at all  Food Insecurity: No Food Insecurity (09/24/2024)  Received from [facility]  Hunger Vital Sign  Within the past 12 months, you worried that your food would run out before you got the money to buy more.: Never true  Within the past 12 months, the food you bought just didn't last and you didn't have money to get more.: Never true  Transportation Needs: No Transportation Needs (09/24/2024)  Received from [facility] - Transportation  In the past 12 months, has lack of transportation kept you from medical appointments or from getting medications?: No  In the past 12 months, has lack of transportation kept you from meetings, work, or from getting things needed for daily living?: No   Review of Systems:  A comprehensive 14 point ROS was performed, reviewed, and the pertinent orthopaedic findings are documented in the HPI.  Physical Exam: Vitals:  10/26/24 1006  BP: 124/88  Weight: (!) 101.2 kg (223 lb)  Height: 180.3 cm (5' 11)  PainSc: 6  PainLoc: Elbow   General/Constitutional: The patient appears to be well-nourished, well-developed, and in no acute distress. Neuro/Psych: Normal mood and affect, oriented to person, place and time. Eyes: Non-icteric. Pupils are equal, round, and reactive to light, and exhibit synchronous movement. ENT: Unremarkable. Lymphatic: No palpable adenopathy. Respiratory: Lungs clear to auscultation, Normal chest excursion, No wheezes, and Non-labored breathing Cardiovascular: Regular rate and rhythm. No murmurs. and No edema, swelling or tenderness, except as noted in detailed exam. Integumentary: No impressive skin lesions present, except as noted in detailed exam. Musculoskeletal: Unremarkable, except as noted in detailed exam.  Right elbow  exam: Skin inspection of the right elbow is unremarkable. No swelling, erythema, ecchymosis, abrasions, or other skin abnormalities are identified, other than an area of resolving ecchymosis over the anterior aspect of his elbow due to a recent blood draw. He experiences mild to moderate tenderness to palpation over the lateral aspect of the elbow several centimeters distal to the lateral epicondyle, as well as more mild tenderness to palpation over the medial epicondylar region. The lateral sided elbow pain is aggravated with resisted wrist extension, resisted long finger extension, and with maximal passive wrist flexion with the elbow extended. His medial elbow symptoms are aggravated with resisted wrist flexion and pronation. He exhibits full active and passive range of motion of the elbow without pain or catching. He is grossly neurovascularly intact in the right forearm and hand. He has a negative Tinel's along the cubital tunnel.  Imaging:  AP, lateral, and oblique views of the right elbow are obtained. These films demonstrate no evidence for fractures, lytic lesions, or significant degenerative changes.  A recent MRI scan of the right elbow was obtained last year. The MRI report but not the images are available for me to review. By report, this study demonstrated 1. Lateral epicondylitis. Common extensor tendon origin tendinopathy, peritendinitis and partial-thickness interstitial  tenoosseous origin tearing. Tear extends over a length of 12 mm and occupies 50% of the origin. 2. Medial epicondylitis. Common flexor tendon origin tendinopathy, peritendinitis and minimal interstitial tearing. Tear extends over a length of 3 mm and occupies less than 20% of the origin. 3. No osseous stress injury, fracture, AVN or mass. 4. No collateral ligament tear.   Assessment: 1. Lateral epicondylitis, right elbow.  2. Medial epicondylitis, right elbow.   Plan: The treatment options were discussed with the  patient. In addition, patient educational materials were provided regarding the diagnosis and treatment options. The patient is quite frustrated by his symptoms and functional limitations, and is ready to consider more aggressive treatment options. Therefore, I have recommended a surgical procedure, specifically open debridements and repairs of the common flexor and extensor origins of the right elbow. The procedure was discussed with the patient, as were the potential risks (including bleeding, infection, nerve and/or blood vessel injury, persistent or recurrent pain, failure of the repair, progression of arthritis, need for further surgery, blood clots, strokes, heart attacks and/or arhythmias, pneumonia, etc.) and benefits. The patient states his understanding and wishes to proceed. All of the patient's questions and concerns were answered. He can call any time with further concerns. He will follow up post-surgery, routine.    H&P reviewed and patient re-examined. No changes.

## 2024-11-14 NOTE — Anesthesia Preprocedure Evaluation (Addendum)
"                                    Anesthesia Evaluation  Patient identified by MRN, date of birth, ID band Patient awake    Reviewed: Allergy & Precautions, H&P , NPO status , Patient's Chart, lab work & pertinent test results  Airway Mallampati: III  TM Distance: >3 FB Neck ROM: full    Dental  (+) Chipped,    Pulmonary asthma (mild) , sleep apnea , former smoker   Pulmonary exam normal        Cardiovascular hypertension, Normal cardiovascular exam     Neuro/Psych  PSYCHIATRIC DISORDERS      negative neurological ROS     GI/Hepatic negative GI ROS, Neg liver ROS,,,  Endo/Other  diabetes, Poorly Controlled, Type 2    Renal/GU      Musculoskeletal   Abdominal  (+) + obese  Peds  Hematology negative hematology ROS (+)   Anesthesia Other Findings Past Medical History: No date: Asthma 04/05/2013: Atypical mole     Comment:  Left forearm. Atypical combined nevus, moderate atypia. No date: Diabetes mellitus without complication (HCC) 04/23/2014: Dysplastic nevus     Comment:  Left prox. mid. tricep axilla. Mild atypia, margins               free. No date: Hypertension No date: Lateral epicondylitis, right elbow No date: Personality disorder [facility])     Comment:  borderline  Past Surgical History: 2006: ANTERIOR CRUCIATE LIGAMENT REPAIR; Right 2010: CERVICAL DISC SURGERY 11/01/2019: COLONOSCOPY WITH PROPOFOL ; N/A     Comment:  Procedure: COLONOSCOPY WITH PROPOFOL ;  Surgeon: Therisa Bi, MD;  Location: [facility] ENDOSCOPY;  Service:               Gastroenterology;  Laterality: N/A; 1996: MENISCUS REPAIR; Left  BMI    Body Mass Index: 30.27 kg/m      Reproductive/Obstetrics negative OB ROS                              Anesthesia Physical Anesthesia Plan  ASA: 2  Anesthesia Plan: General   Post-op Pain Management: Toradol  IV (intra-op)* and Ofirmev  IV (intra-op)*   Induction: Intravenous  PONV Risk  Score and Plan: Dexamethasone , Ondansetron , Midazolam  and Treatment may vary due to age or medical condition  Airway Management Planned: LMA  Additional Equipment:   Intra-op Plan:   Post-operative Plan: Extubation in OR  Informed Consent: I have reviewed the patients History and Physical, chart, labs and discussed the procedure including the risks, benefits and alternatives for the proposed anesthesia with the patient or authorized representative who has indicated his/her understanding and acceptance.     Dental Advisory Given  Plan Discussed with: Anesthesiologist, CRNA and Surgeon  Anesthesia Plan Comments:          Anesthesia Quick Evaluation  "

## 2024-11-14 NOTE — Op Note (Signed)
 11/14/2024  10:14 AM  Patient:   Francisco Jackson  Pre-Op Diagnosis:   Chronic medial and lateral epicondylitis, right elbow.  Post-Op Diagnosis:   Same.  Procedure:   1. Debridement/repair of common extensor origin, right elbow.  2. Debridement/repair of common flexor origin, right elbow.  Surgeon:   DOROTHA Reyes Maltos, MD  Assistant:   None  Anesthesia:   General LMA  Findings:   As above.  Complications:   None  EBL:   20 cc  Fluids:   600 cc crystalloid  TT:   59 minutes at 250 mmHg  Drains:   None  Closure:   2-0 Vicryl subcuticular sutures  Implants:   Biomet JuggerKnot 1.45 mm anchor x2  Brief Clinical Note:   The patient is a 49 year old male with a 1+ year history of lateral greater then medial sided right elbow pain. His symptoms have persisted despite medications, activity modification, injections, etc. His history and examination are consistent with chronic medial and lateral epicondylitis of the right elbow. The patient presents at this time for debridement and repair of the common flexor and extensor origins of the right elbow.  Procedure:   The patient was brought into the operating room and lain in the supine position. After adequate general laryngal mask anesthesia was obtained, the patient's right upper extremity was prepped with ChloraPrep solution before being draped sterilely. Preoperative antibiotics were administered. A timeout was performed to verify the appropriate surgical site before the limb was exsanguinated with an Esmarch and the tourniquet inflated to 250 mmHg.   An approximately 3-4 cm incision was made over the lateral aspect of the elbow beginning at the lateral epicondyle and extending distally in line with the common extensor origin tendons. The incision was carried down through the subcutaneous tissues to expose the common extensor origin. The extensor carpi radialis brevis tendon was identified and incised in line with the incision. The areas  of significantly degenerative tendinous tissues were debrided sharply with a #15 blade down to the epicondylar region. The bone itself was roughened with a rongeur to provide a good bleeding surface for reattachment of the tendon. A Biomet 1.45 mm JuggerKnot anchor was inserted into the exposed bone of the lateral epicondyle. The sutures were passed through the tendon and tied securely to effect the repair.   The wound was copiously irrigated with bacitracin saline solution using bulb irrigation before several 2-0 Vicryl interrupted sutures were used to close the longitudinal incision in the tendon in a side-to-side fashion. The subcutaneous tissues were closed using 2-0 Vicryl interrupted sutures before the skin was closed using 2-0 Vicryl subcuticular sutures. Benzoin and Steri-Strips were applied to the skin. A total of 10 cc of 0.5%  Sensorcaine  with epinephrine  was injected in and around the incision to help with postoperative analgesia before a sterile bulky dressing was applied to the elbow.   Next, the medial aspect of the elbow was addressed. An approximately 3-4 cm incision was made over the radial aspect of the elbow beginning at the lateral epicondyle and extending distally. The incision was carried down through the subcutaneous tissues to expose the common flexor origin. The common flexor origin was incised in line with the incision for about 3 cm. The areas of significantly degenerative tendinous tissues were debrided sharply with a #15 blade down to the epicondylar region. The bone itself was roughened with a rongeur to provide a good bleeding surface for reattachment of the tendon. A Biomet 1.45 mm JuggerKnot anchor  was inserted into the exposed bone of the lateral epicondyle. The sutures were passed through the tendon and tied securely to effect the repair.   The wound was copiously irrigated with bacitracin saline solution using bulb irrigation before several 2-0 Vicryl interrupted sutures  were used to close the longitudinal incision in the tendon in a side-to-side fashion. The subcutaneous tissues were closed using 2-0 Vicryl interrupted sutures before the skin was closed using 2-0 Vicryl subcuticular sutures. Benzoin and Steri-Strips were applied to the skin. A total of 10 cc of 0.5%  Sensorcaine  with epinephrine  was injected in and around the incision to help with postoperative analgesia before a sterile bulky dressing was applied to the elbow. A Velcro wrist immobilizer was applied before the patient was awakened, extubated, and returned to the recovery room in satisfactory condition after tolerating the procedure well.

## 2024-11-14 NOTE — Discharge Instructions (Addendum)
 Orthopedic discharge instructions: Keep dressing dry and intact. Keep hand elevated above heart level. May shower after dressing removed on postop day 4 (Sunday). Cover sutures with Band-Aids after drying off. Apply ice to affected area frequently. Take ibuprofen 600-800 mg TID with meals for 3-5 days, then as necessary. Take ES Tylenol  or pain medication as prescribed when needed.  Return for follow-up in 10-14 days or as scheduled.

## 2024-11-14 NOTE — Anesthesia Procedure Notes (Addendum)
 Procedure Name: LMA Insertion Date/Time: 11/14/2024 8:37 AM  Performed by: Shonte Soderlund, CRNAPre-anesthesia Checklist: Patient identified, Patient being monitored, Timeout performed, Emergency Drugs available and Suction available Patient Re-evaluated:Patient Re-evaluated prior to induction Oxygen Delivery Method: Circle system utilized Preoxygenation: Pre-oxygenation with 100% oxygen Induction Type: IV induction Ventilation: Mask ventilation without difficulty LMA: LMA inserted LMA Size: 5.0 Tube type: Oral Number of attempts: 1 Placement Confirmation: positive ETCO2 and breath sounds checked- equal and bilateral Tube secured with: Tape Dental Injury: Teeth and Oropharynx as per pre-operative assessment

## 2024-11-14 NOTE — Transfer of Care (Signed)
 Immediate Anesthesia Transfer of Care Note  Patient: Francisco Jackson  Procedure(s) Performed: IRRIGATION AND DEBRIDEMENT ELBOW (Right: Elbow)  Patient Location: PACU  Anesthesia Type:General  Level of Consciousness: drowsy and patient cooperative  Airway & Oxygen Therapy: Patient Spontanous Breathing and Patient connected to face mask oxygen  Post-op Assessment: Report given to RN and Post -op Vital signs reviewed and stable  Post vital signs: Reviewed and stable  Last Vitals:  Vitals Value Taken Time  BP 124/81 11/14/24 10:10  Temp 97.3   Pulse 90 11/14/24 10:12  Resp 17 11/14/24 10:12  SpO2 95 % 11/14/24 10:12  Vitals shown include unfiled device data.  Last Pain:  Vitals:   11/14/24 0659  PainSc: 4          Complications: No notable events documented.

## 2024-11-15 NOTE — Anesthesia Postprocedure Evaluation (Signed)
"   Anesthesia Post Note  Patient: Francisco Jackson  Procedure(s) Performed: IRRIGATION AND DEBRIDEMENT ELBOW (Right: Elbow)  Patient location during evaluation: PACU Anesthesia Type: General Level of consciousness: awake and alert Pain management: pain level controlled Vital Signs Assessment: post-procedure vital signs reviewed and stable Respiratory status: spontaneous breathing, nonlabored ventilation and respiratory function stable Cardiovascular status: blood pressure returned to baseline and stable Postop Assessment: no apparent nausea or vomiting Anesthetic complications: no   No notable events documented.   Last Vitals:  Vitals:   11/14/24 1045 11/14/24 1057  BP: (!) 128/92 117/81  Pulse: 88 77  Resp: 12 16  Temp:  (!) 36.1 C  SpO2: 91% 93%    Last Pain:  Vitals:   11/15/24 0917  TempSrc:   PainSc: 7                  Camellia Merilee Louder      "

## 2024-11-19 ENCOUNTER — Encounter: Admitting: Internal Medicine

## 2024-12-27 ENCOUNTER — Ambulatory Visit: Admitting: Internal Medicine
